# Patient Record
Sex: Male | Born: 1970 | Race: Black or African American | Hispanic: No | Marital: Married | Smoking: Former smoker
Health system: Southern US, Community
[De-identification: ages and names within clinical notes are randomized; demographics above are authoritative.]

## PROBLEM LIST (undated history)

## (undated) DIAGNOSIS — E876 Hypokalemia: Secondary | ICD-10-CM

## (undated) DIAGNOSIS — E119 Type 2 diabetes mellitus without complications: Secondary | ICD-10-CM

## (undated) DIAGNOSIS — I10 Essential (primary) hypertension: Secondary | ICD-10-CM

## (undated) DIAGNOSIS — Z8249 Family history of ischemic heart disease and other diseases of the circulatory system: Secondary | ICD-10-CM

## (undated) DIAGNOSIS — W57XXXA Bitten or stung by nonvenomous insect and other nonvenomous arthropods, initial encounter: Secondary | ICD-10-CM

## (undated) DIAGNOSIS — G5600 Carpal tunnel syndrome, unspecified upper limb: Secondary | ICD-10-CM

## (undated) DIAGNOSIS — R51 Headache: Secondary | ICD-10-CM

## (undated) DIAGNOSIS — R519 Headache, unspecified: Secondary | ICD-10-CM

## (undated) HISTORY — DX: Essential (primary) hypertension: I10

## (undated) HISTORY — DX: Carpal tunnel syndrome, unspecified upper limb: G56.00

## (undated) HISTORY — DX: Type 2 diabetes mellitus without complications: E11.9

## (undated) HISTORY — PX: ORIF FACIAL FRACTURE: SHX2118

## (undated) HISTORY — DX: Bitten or stung by nonvenomous insect and other nonvenomous arthropods, initial encounter: W57.XXXA

## (undated) HISTORY — PX: FRACTURE SURGERY: SHX138

## (undated) HISTORY — DX: Family history of ischemic heart disease and other diseases of the circulatory system: Z82.49

---

## 1997-06-30 ENCOUNTER — Emergency Department (HOSPITAL_COMMUNITY): Admission: EM | Admit: 1997-06-30 | Discharge: 1997-06-30 | Payer: Self-pay | Admitting: Emergency Medicine

## 1997-12-27 ENCOUNTER — Emergency Department (HOSPITAL_COMMUNITY): Admission: EM | Admit: 1997-12-27 | Discharge: 1997-12-27 | Payer: Self-pay | Admitting: *Deleted

## 1997-12-28 ENCOUNTER — Emergency Department (HOSPITAL_COMMUNITY): Admission: EM | Admit: 1997-12-28 | Discharge: 1997-12-28 | Payer: Self-pay | Admitting: Emergency Medicine

## 1998-01-09 ENCOUNTER — Emergency Department (HOSPITAL_COMMUNITY): Admission: EM | Admit: 1998-01-09 | Discharge: 1998-01-09 | Payer: Self-pay | Admitting: Emergency Medicine

## 1998-06-10 ENCOUNTER — Emergency Department (HOSPITAL_COMMUNITY): Admission: EM | Admit: 1998-06-10 | Discharge: 1998-06-11 | Payer: Self-pay | Admitting: Emergency Medicine

## 1998-06-11 ENCOUNTER — Encounter: Payer: Self-pay | Admitting: Emergency Medicine

## 1998-11-24 ENCOUNTER — Emergency Department (HOSPITAL_COMMUNITY): Admission: EM | Admit: 1998-11-24 | Discharge: 1998-11-24 | Payer: Self-pay | Admitting: Emergency Medicine

## 2000-07-14 ENCOUNTER — Emergency Department (HOSPITAL_COMMUNITY): Admission: EM | Admit: 2000-07-14 | Discharge: 2000-07-14 | Payer: Self-pay | Admitting: Emergency Medicine

## 2000-07-14 ENCOUNTER — Encounter: Payer: Self-pay | Admitting: Emergency Medicine

## 2000-10-27 ENCOUNTER — Inpatient Hospital Stay (HOSPITAL_COMMUNITY): Admission: EM | Admit: 2000-10-27 | Discharge: 2000-10-29 | Payer: Self-pay | Admitting: *Deleted

## 2000-12-17 ENCOUNTER — Emergency Department (HOSPITAL_COMMUNITY): Admission: EM | Admit: 2000-12-17 | Discharge: 2000-12-17 | Payer: Self-pay

## 2001-07-18 ENCOUNTER — Emergency Department (HOSPITAL_COMMUNITY): Admission: EM | Admit: 2001-07-18 | Discharge: 2001-07-18 | Payer: Self-pay | Admitting: Emergency Medicine

## 2002-03-15 HISTORY — PX: HAND SURGERY: SHX662

## 2002-07-06 ENCOUNTER — Encounter: Payer: Self-pay | Admitting: Emergency Medicine

## 2002-07-06 ENCOUNTER — Emergency Department (HOSPITAL_COMMUNITY): Admission: EM | Admit: 2002-07-06 | Discharge: 2002-07-06 | Payer: Self-pay | Admitting: Emergency Medicine

## 2002-07-23 ENCOUNTER — Emergency Department (HOSPITAL_COMMUNITY): Admission: EM | Admit: 2002-07-23 | Discharge: 2002-07-23 | Payer: Self-pay | Admitting: Emergency Medicine

## 2002-07-23 ENCOUNTER — Encounter: Payer: Self-pay | Admitting: Emergency Medicine

## 2002-08-05 ENCOUNTER — Emergency Department (HOSPITAL_COMMUNITY): Admission: EM | Admit: 2002-08-05 | Discharge: 2002-08-05 | Payer: Self-pay | Admitting: Emergency Medicine

## 2002-08-19 ENCOUNTER — Emergency Department (HOSPITAL_COMMUNITY): Admission: EM | Admit: 2002-08-19 | Discharge: 2002-08-19 | Payer: Self-pay | Admitting: Emergency Medicine

## 2002-08-27 ENCOUNTER — Emergency Department (HOSPITAL_COMMUNITY): Admission: EM | Admit: 2002-08-27 | Discharge: 2002-08-27 | Payer: Self-pay | Admitting: Emergency Medicine

## 2002-09-19 ENCOUNTER — Encounter: Payer: Self-pay | Admitting: Emergency Medicine

## 2002-09-19 ENCOUNTER — Emergency Department (HOSPITAL_COMMUNITY): Admission: EM | Admit: 2002-09-19 | Discharge: 2002-09-19 | Payer: Self-pay | Admitting: Emergency Medicine

## 2002-11-29 ENCOUNTER — Emergency Department (HOSPITAL_COMMUNITY): Admission: EM | Admit: 2002-11-29 | Discharge: 2002-11-29 | Payer: Self-pay

## 2003-03-05 ENCOUNTER — Emergency Department (HOSPITAL_COMMUNITY): Admission: EM | Admit: 2003-03-05 | Discharge: 2003-03-05 | Payer: Self-pay | Admitting: Emergency Medicine

## 2003-07-03 ENCOUNTER — Emergency Department (HOSPITAL_COMMUNITY): Admission: EM | Admit: 2003-07-03 | Discharge: 2003-07-03 | Payer: Self-pay | Admitting: Family Medicine

## 2003-11-23 ENCOUNTER — Emergency Department (HOSPITAL_COMMUNITY): Admission: EM | Admit: 2003-11-23 | Discharge: 2003-11-23 | Payer: Self-pay | Admitting: Emergency Medicine

## 2003-12-02 IMAGING — CT CT HEAD W/O CM
1 series · 16 of 28 positions shown, 20 images · non-contrast
Comparison: none

FINDINGS
CLINICAL DATA: HEADACHES.  SEIZURE.
CT OF THE HEAD WITHOUT CONTRAST
ROUTINE NON-CONTRAST HEAD CT WAS PERFORMED.
THERE IS NO EVIDENCE OF INTRACRANIAL HEMORRHAGE, BRAIN EDEMA, OR MASS EFFECT.  THE VENTRICLES ARE
NORMAL.  NO EXTRA-AXIAL ABNORMALITIES ARE IDENTIFIED.  BONE WINDOWS SHOW NO SIGNIFICANT
ABNORMALITIES.
IMPRESSION
NEGATIVE NON-CONTRAST HEAD CT.

[Series 2: trauma head · axial · 0.47mm/px · z∈[+124,+250]mm · 16 of 28 slices shown, 20 images]
[im 2/28  brain]
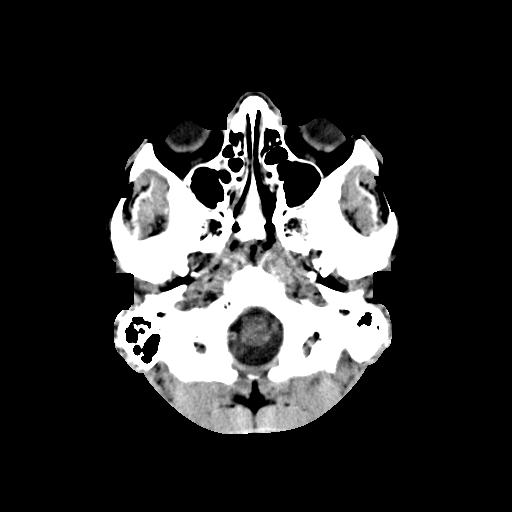
[im 2/28  bone]
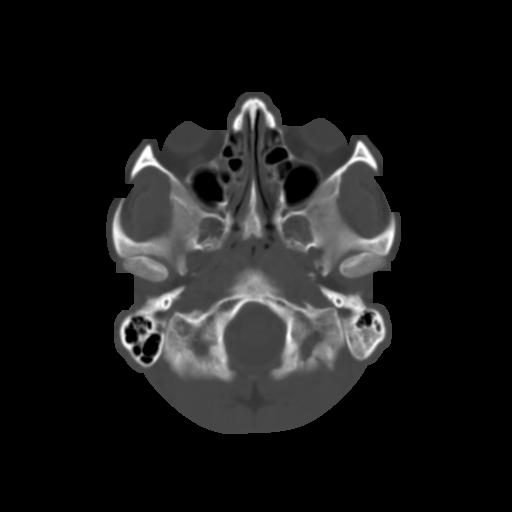
[im 4/28  brain]
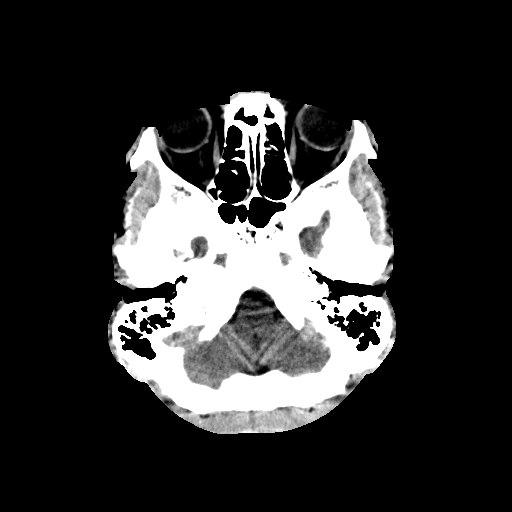
[im 6/28  brain]
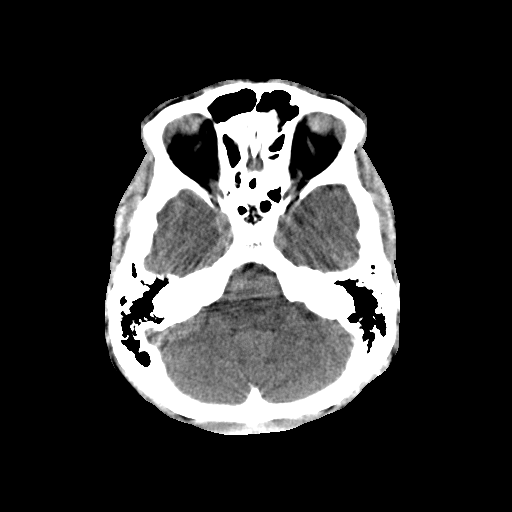
[im 7/28  brain]
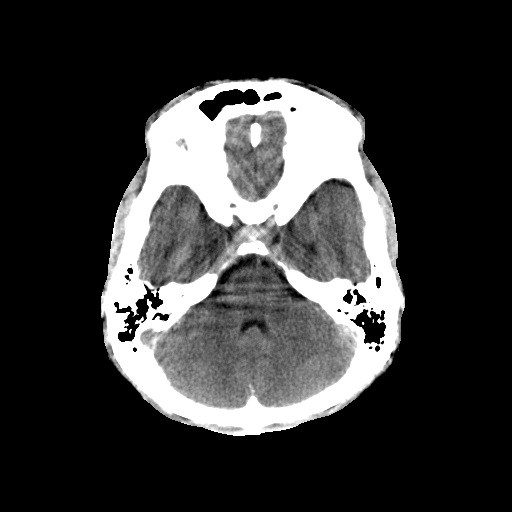
[im 9/28  brain]
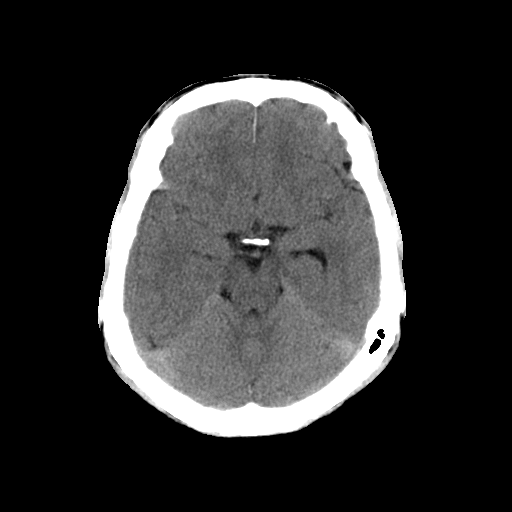
[im 9/28  bone]
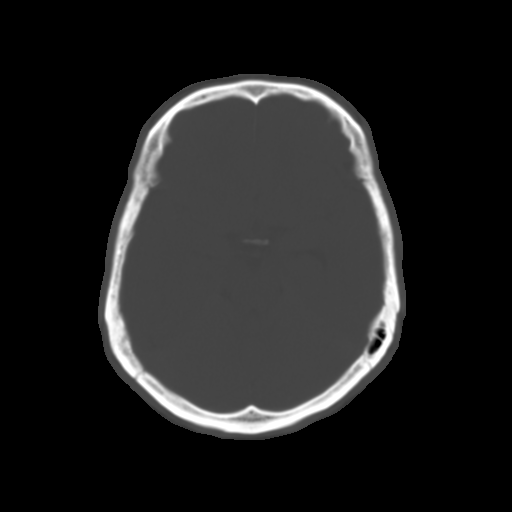
[im 10/28  brain]
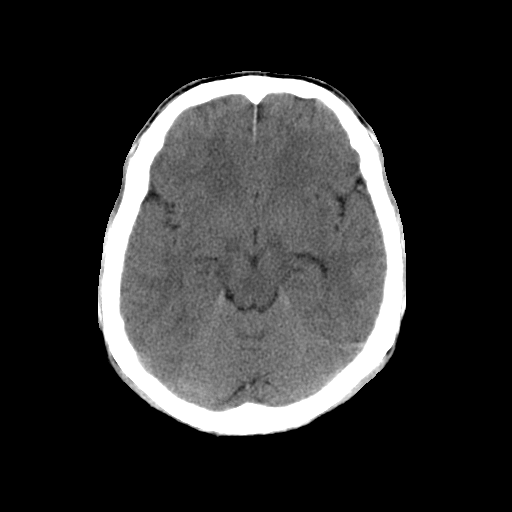
[im 12/28  brain]
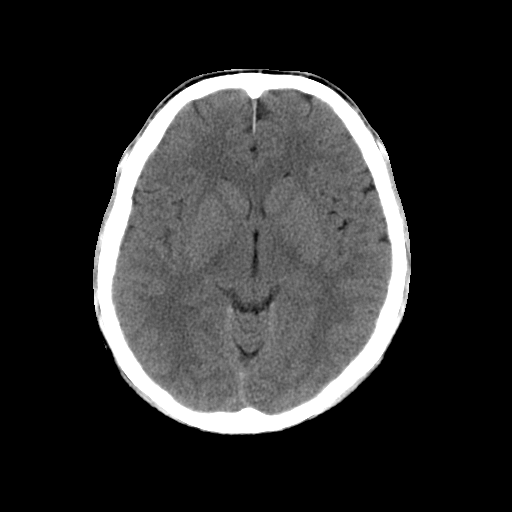
[im 14/28  brain]
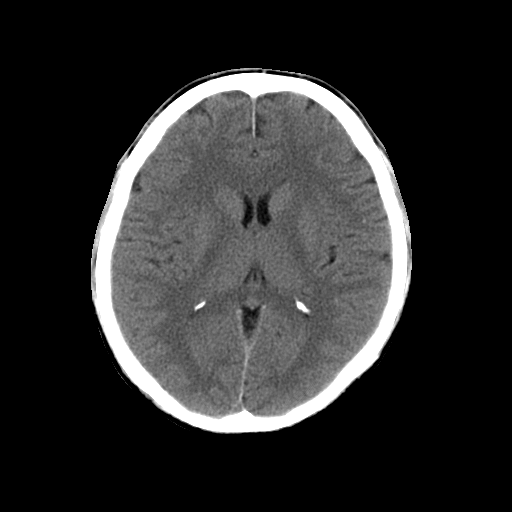
[im 15/28  brain]
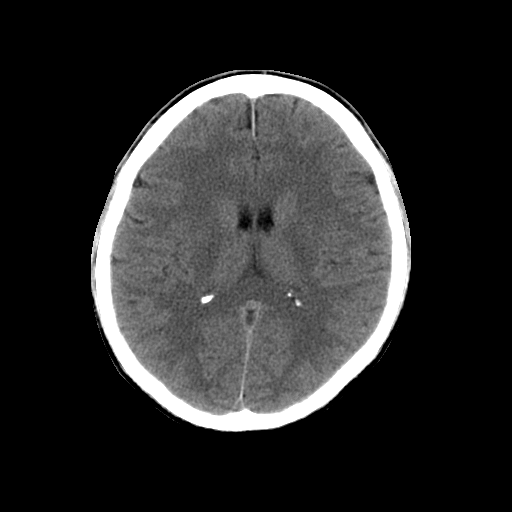
[im 15/28  bone]
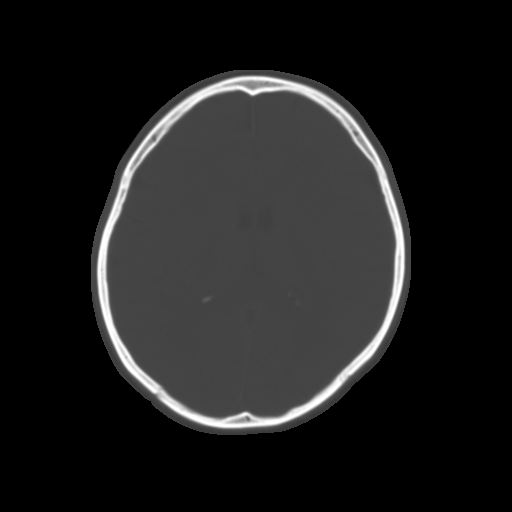
[im 17/28  brain]
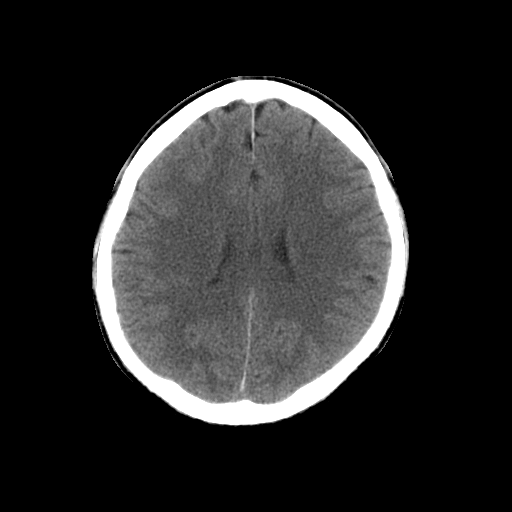
[im 19/28  brain]
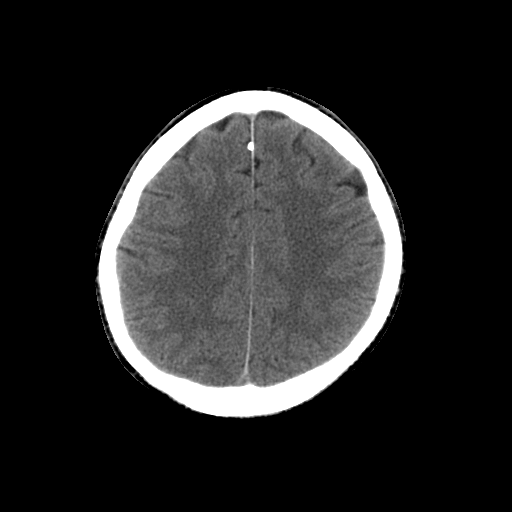
[im 20/28  brain]
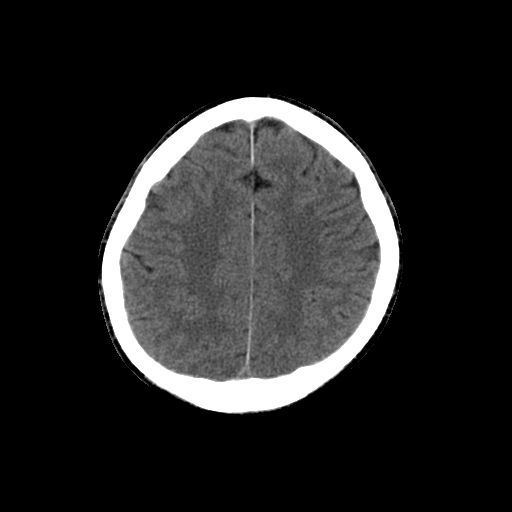
[im 22/28  brain]
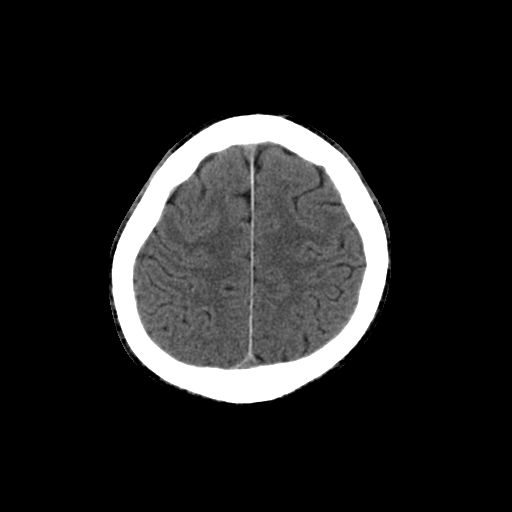
[im 22/28  bone]
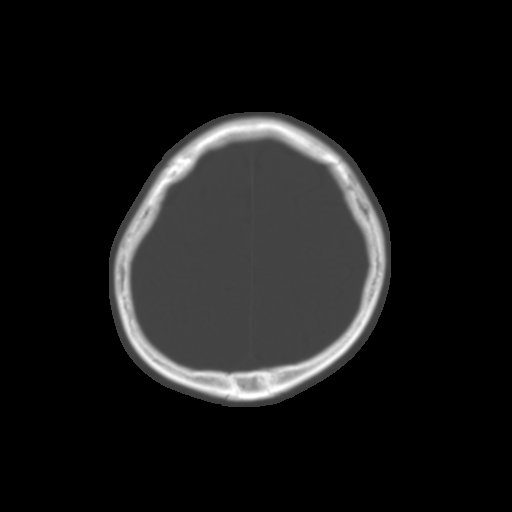
[im 23/28  brain]
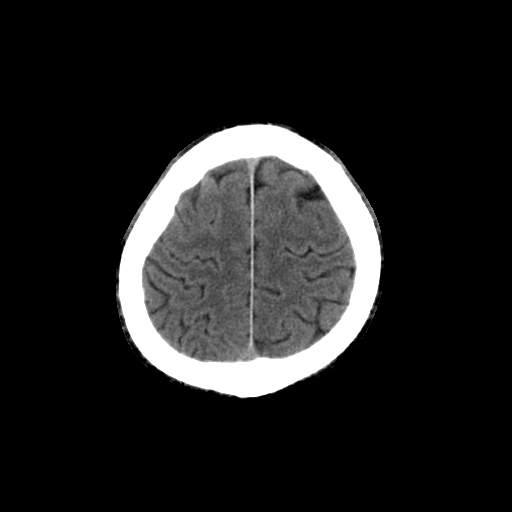
[im 25/28  brain]
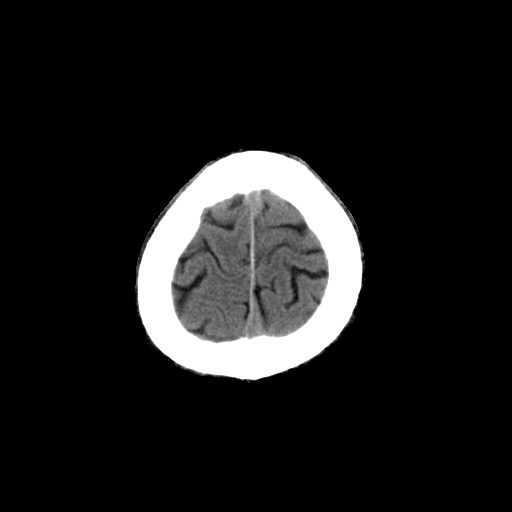
[im 27/28  brain]
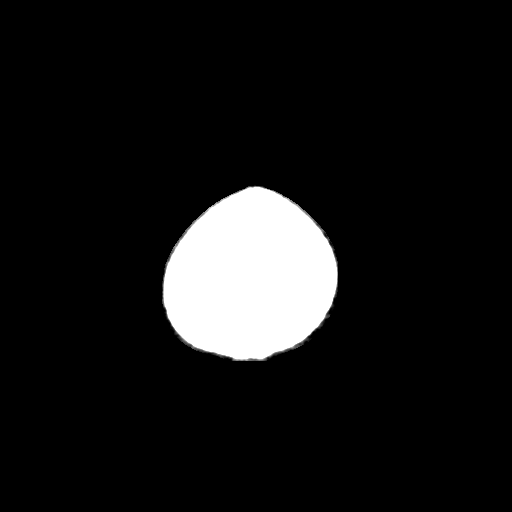

[16 of 28 positions shown; findings below may reference images not displayed]

## 2003-12-07 ENCOUNTER — Emergency Department (HOSPITAL_COMMUNITY): Admission: EM | Admit: 2003-12-07 | Discharge: 2003-12-07 | Payer: Self-pay | Admitting: Emergency Medicine

## 2004-05-04 ENCOUNTER — Emergency Department (HOSPITAL_COMMUNITY): Admission: EM | Admit: 2004-05-04 | Discharge: 2004-05-04 | Payer: Self-pay | Admitting: Family Medicine

## 2004-05-09 ENCOUNTER — Emergency Department (HOSPITAL_COMMUNITY): Admission: EM | Admit: 2004-05-09 | Discharge: 2004-05-09 | Payer: Self-pay | Admitting: Family Medicine

## 2004-07-21 ENCOUNTER — Emergency Department (HOSPITAL_COMMUNITY): Admission: EM | Admit: 2004-07-21 | Discharge: 2004-07-21 | Payer: Self-pay | Admitting: Family Medicine

## 2004-08-30 ENCOUNTER — Emergency Department (HOSPITAL_COMMUNITY): Admission: EM | Admit: 2004-08-30 | Discharge: 2004-08-30 | Payer: Self-pay | Admitting: Emergency Medicine

## 2004-09-06 ENCOUNTER — Emergency Department (HOSPITAL_COMMUNITY): Admission: EM | Admit: 2004-09-06 | Discharge: 2004-09-06 | Payer: Self-pay | Admitting: Emergency Medicine

## 2004-09-14 ENCOUNTER — Emergency Department (HOSPITAL_COMMUNITY): Admission: EM | Admit: 2004-09-14 | Discharge: 2004-09-14 | Payer: Self-pay | Admitting: Emergency Medicine

## 2005-03-29 ENCOUNTER — Emergency Department (HOSPITAL_COMMUNITY): Admission: EM | Admit: 2005-03-29 | Discharge: 2005-03-29 | Payer: Self-pay | Admitting: Family Medicine

## 2005-06-06 ENCOUNTER — Emergency Department (HOSPITAL_COMMUNITY): Admission: EM | Admit: 2005-06-06 | Discharge: 2005-06-06 | Payer: Self-pay | Admitting: Emergency Medicine

## 2005-08-05 ENCOUNTER — Emergency Department (HOSPITAL_COMMUNITY): Admission: EM | Admit: 2005-08-05 | Discharge: 2005-08-05 | Payer: Self-pay | Admitting: Emergency Medicine

## 2006-01-20 ENCOUNTER — Emergency Department (HOSPITAL_COMMUNITY): Admission: EM | Admit: 2006-01-20 | Discharge: 2006-01-20 | Payer: Self-pay | Admitting: Family Medicine

## 2006-02-16 ENCOUNTER — Emergency Department (HOSPITAL_COMMUNITY): Admission: EM | Admit: 2006-02-16 | Discharge: 2006-02-16 | Payer: Self-pay | Admitting: Emergency Medicine

## 2006-03-03 ENCOUNTER — Emergency Department (HOSPITAL_COMMUNITY): Admission: EM | Admit: 2006-03-03 | Discharge: 2006-03-03 | Payer: Self-pay | Admitting: Emergency Medicine

## 2006-07-06 ENCOUNTER — Emergency Department (HOSPITAL_COMMUNITY): Admission: EM | Admit: 2006-07-06 | Discharge: 2006-07-06 | Payer: Self-pay | Admitting: Family Medicine

## 2006-09-28 ENCOUNTER — Emergency Department (HOSPITAL_COMMUNITY): Admission: EM | Admit: 2006-09-28 | Discharge: 2006-09-28 | Payer: Self-pay | Admitting: Emergency Medicine

## 2006-10-19 ENCOUNTER — Emergency Department (HOSPITAL_COMMUNITY): Admission: EM | Admit: 2006-10-19 | Discharge: 2006-10-20 | Payer: Self-pay | Admitting: Emergency Medicine

## 2006-10-27 ENCOUNTER — Emergency Department (HOSPITAL_COMMUNITY): Admission: EM | Admit: 2006-10-27 | Discharge: 2006-10-27 | Payer: Self-pay | Admitting: Emergency Medicine

## 2006-10-30 ENCOUNTER — Emergency Department (HOSPITAL_COMMUNITY): Admission: EM | Admit: 2006-10-30 | Discharge: 2006-10-30 | Payer: Self-pay | Admitting: Emergency Medicine

## 2006-11-03 ENCOUNTER — Emergency Department (HOSPITAL_COMMUNITY): Admission: EM | Admit: 2006-11-03 | Discharge: 2006-11-03 | Payer: Self-pay | Admitting: Emergency Medicine

## 2006-11-06 ENCOUNTER — Emergency Department (HOSPITAL_COMMUNITY): Admission: EM | Admit: 2006-11-06 | Discharge: 2006-11-06 | Payer: Self-pay | Admitting: Emergency Medicine

## 2006-11-07 ENCOUNTER — Emergency Department (HOSPITAL_COMMUNITY): Admission: EM | Admit: 2006-11-07 | Discharge: 2006-11-08 | Payer: Self-pay | Admitting: Emergency Medicine

## 2006-11-07 ENCOUNTER — Emergency Department (HOSPITAL_COMMUNITY): Admission: EM | Admit: 2006-11-07 | Discharge: 2006-11-07 | Payer: Self-pay | Admitting: Family Medicine

## 2006-11-29 ENCOUNTER — Emergency Department (HOSPITAL_COMMUNITY): Admission: EM | Admit: 2006-11-29 | Discharge: 2006-11-29 | Payer: Self-pay | Admitting: Emergency Medicine

## 2006-12-10 ENCOUNTER — Emergency Department (HOSPITAL_COMMUNITY): Admission: EM | Admit: 2006-12-10 | Discharge: 2006-12-10 | Payer: Self-pay | Admitting: Emergency Medicine

## 2006-12-15 ENCOUNTER — Emergency Department (HOSPITAL_COMMUNITY): Admission: EM | Admit: 2006-12-15 | Discharge: 2006-12-15 | Payer: Self-pay | Admitting: Emergency Medicine

## 2007-01-12 ENCOUNTER — Encounter: Admission: RE | Admit: 2007-01-12 | Discharge: 2007-04-12 | Payer: Self-pay | Admitting: Orthopedic Surgery

## 2007-02-19 ENCOUNTER — Emergency Department (HOSPITAL_COMMUNITY): Admission: EM | Admit: 2007-02-19 | Discharge: 2007-02-19 | Payer: Self-pay | Admitting: Emergency Medicine

## 2007-02-24 ENCOUNTER — Emergency Department (HOSPITAL_COMMUNITY): Admission: EM | Admit: 2007-02-24 | Discharge: 2007-02-24 | Payer: Self-pay | Admitting: Emergency Medicine

## 2007-03-23 ENCOUNTER — Emergency Department (HOSPITAL_COMMUNITY): Admission: EM | Admit: 2007-03-23 | Discharge: 2007-03-23 | Payer: Self-pay | Admitting: Emergency Medicine

## 2007-04-17 ENCOUNTER — Emergency Department (HOSPITAL_COMMUNITY): Admission: EM | Admit: 2007-04-17 | Discharge: 2007-04-17 | Payer: Self-pay | Admitting: Emergency Medicine

## 2007-04-25 ENCOUNTER — Emergency Department (HOSPITAL_COMMUNITY): Admission: EM | Admit: 2007-04-25 | Discharge: 2007-04-25 | Payer: Self-pay | Admitting: Emergency Medicine

## 2007-05-29 ENCOUNTER — Emergency Department (HOSPITAL_COMMUNITY): Admission: EM | Admit: 2007-05-29 | Discharge: 2007-05-29 | Payer: Self-pay | Admitting: Family Medicine

## 2007-06-08 ENCOUNTER — Encounter: Admission: RE | Admit: 2007-06-08 | Discharge: 2007-09-06 | Payer: Self-pay | Admitting: Orthopedic Surgery

## 2007-07-01 ENCOUNTER — Emergency Department (HOSPITAL_COMMUNITY): Admission: EM | Admit: 2007-07-01 | Discharge: 2007-07-01 | Payer: Self-pay | Admitting: Emergency Medicine

## 2007-07-08 ENCOUNTER — Emergency Department (HOSPITAL_COMMUNITY): Admission: EM | Admit: 2007-07-08 | Discharge: 2007-07-08 | Payer: Self-pay | Admitting: Emergency Medicine

## 2007-07-23 ENCOUNTER — Emergency Department (HOSPITAL_COMMUNITY): Admission: EM | Admit: 2007-07-23 | Discharge: 2007-07-24 | Payer: Self-pay | Admitting: Emergency Medicine

## 2007-08-23 ENCOUNTER — Encounter (INDEPENDENT_AMBULATORY_CARE_PROVIDER_SITE_OTHER): Payer: Self-pay | Admitting: Orthopedic Surgery

## 2007-08-23 ENCOUNTER — Ambulatory Visit (HOSPITAL_BASED_OUTPATIENT_CLINIC_OR_DEPARTMENT_OTHER): Admission: RE | Admit: 2007-08-23 | Discharge: 2007-08-23 | Payer: Self-pay | Admitting: Orthopedic Surgery

## 2007-08-27 ENCOUNTER — Emergency Department (HOSPITAL_COMMUNITY): Admission: EM | Admit: 2007-08-27 | Discharge: 2007-08-27 | Payer: Self-pay | Admitting: Family Medicine

## 2007-09-04 ENCOUNTER — Emergency Department (HOSPITAL_COMMUNITY): Admission: EM | Admit: 2007-09-04 | Discharge: 2007-09-04 | Payer: Self-pay | Admitting: Emergency Medicine

## 2007-09-06 ENCOUNTER — Emergency Department (HOSPITAL_COMMUNITY): Admission: EM | Admit: 2007-09-06 | Discharge: 2007-09-06 | Payer: Self-pay | Admitting: Emergency Medicine

## 2007-09-06 ENCOUNTER — Emergency Department (HOSPITAL_COMMUNITY): Admission: EM | Admit: 2007-09-06 | Discharge: 2007-09-07 | Payer: Self-pay | Admitting: Emergency Medicine

## 2007-09-10 ENCOUNTER — Emergency Department (HOSPITAL_COMMUNITY): Admission: EM | Admit: 2007-09-10 | Discharge: 2007-09-10 | Payer: Self-pay | Admitting: Emergency Medicine

## 2007-10-05 ENCOUNTER — Encounter: Admission: RE | Admit: 2007-10-05 | Discharge: 2007-10-26 | Payer: Self-pay | Admitting: Orthopedic Surgery

## 2007-12-07 ENCOUNTER — Emergency Department (HOSPITAL_COMMUNITY): Admission: EM | Admit: 2007-12-07 | Discharge: 2007-12-07 | Payer: Self-pay | Admitting: Emergency Medicine

## 2007-12-13 ENCOUNTER — Emergency Department (HOSPITAL_COMMUNITY): Admission: EM | Admit: 2007-12-13 | Discharge: 2007-12-14 | Payer: Self-pay | Admitting: Emergency Medicine

## 2007-12-21 ENCOUNTER — Emergency Department (HOSPITAL_COMMUNITY): Admission: EM | Admit: 2007-12-21 | Discharge: 2007-12-21 | Payer: Self-pay | Admitting: Emergency Medicine

## 2007-12-29 ENCOUNTER — Emergency Department (HOSPITAL_COMMUNITY): Admission: EM | Admit: 2007-12-29 | Discharge: 2007-12-29 | Payer: Self-pay | Admitting: *Deleted

## 2008-01-05 ENCOUNTER — Emergency Department (HOSPITAL_COMMUNITY): Admission: EM | Admit: 2008-01-05 | Discharge: 2008-01-05 | Payer: Self-pay | Admitting: Emergency Medicine

## 2008-04-08 ENCOUNTER — Emergency Department (HOSPITAL_COMMUNITY): Admission: EM | Admit: 2008-04-08 | Discharge: 2008-04-08 | Payer: Self-pay | Admitting: Family Medicine

## 2008-04-20 ENCOUNTER — Emergency Department (HOSPITAL_COMMUNITY): Admission: EM | Admit: 2008-04-20 | Discharge: 2008-04-20 | Payer: Self-pay | Admitting: Emergency Medicine

## 2008-06-10 ENCOUNTER — Emergency Department (HOSPITAL_COMMUNITY): Admission: EM | Admit: 2008-06-10 | Discharge: 2008-06-10 | Payer: Self-pay | Admitting: Emergency Medicine

## 2008-10-23 ENCOUNTER — Observation Stay (HOSPITAL_COMMUNITY): Admission: EM | Admit: 2008-10-23 | Discharge: 2008-10-24 | Payer: Self-pay | Admitting: Emergency Medicine

## 2008-10-31 ENCOUNTER — Ambulatory Visit (HOSPITAL_COMMUNITY): Admission: RE | Admit: 2008-10-31 | Discharge: 2008-10-31 | Payer: Self-pay | Admitting: Otolaryngology

## 2008-11-09 ENCOUNTER — Emergency Department (HOSPITAL_COMMUNITY): Admission: EM | Admit: 2008-11-09 | Discharge: 2008-11-09 | Payer: Self-pay | Admitting: Emergency Medicine

## 2008-12-04 ENCOUNTER — Encounter: Admission: RE | Admit: 2008-12-04 | Discharge: 2008-12-04 | Payer: Self-pay | Admitting: Otolaryngology

## 2008-12-31 ENCOUNTER — Ambulatory Visit (HOSPITAL_COMMUNITY): Admission: RE | Admit: 2008-12-31 | Discharge: 2008-12-31 | Payer: Self-pay | Admitting: Otolaryngology

## 2009-02-18 ENCOUNTER — Emergency Department (HOSPITAL_COMMUNITY): Admission: EM | Admit: 2009-02-18 | Discharge: 2009-02-18 | Payer: Self-pay | Admitting: Emergency Medicine

## 2009-06-16 ENCOUNTER — Emergency Department (HOSPITAL_COMMUNITY): Admission: EM | Admit: 2009-06-16 | Discharge: 2009-06-16 | Payer: Self-pay | Admitting: Family Medicine

## 2009-06-17 ENCOUNTER — Ambulatory Visit (HOSPITAL_COMMUNITY): Admission: RE | Admit: 2009-06-17 | Discharge: 2009-06-17 | Payer: Self-pay | Admitting: Otolaryngology

## 2009-06-30 ENCOUNTER — Emergency Department (HOSPITAL_COMMUNITY): Admission: EM | Admit: 2009-06-30 | Discharge: 2009-06-30 | Payer: Self-pay | Admitting: Emergency Medicine

## 2009-11-29 ENCOUNTER — Emergency Department (HOSPITAL_COMMUNITY): Admission: EM | Admit: 2009-11-29 | Discharge: 2009-11-29 | Payer: Self-pay | Admitting: Emergency Medicine

## 2010-03-08 ENCOUNTER — Emergency Department (HOSPITAL_COMMUNITY)
Admission: EM | Admit: 2010-03-08 | Discharge: 2010-03-08 | Payer: Self-pay | Source: Home / Self Care | Admitting: Emergency Medicine

## 2010-03-13 ENCOUNTER — Ambulatory Visit (HOSPITAL_COMMUNITY)
Admission: RE | Admit: 2010-03-13 | Discharge: 2010-03-13 | Payer: Self-pay | Source: Home / Self Care | Attending: Otolaryngology | Admitting: Otolaryngology

## 2010-05-01 ENCOUNTER — Emergency Department (HOSPITAL_COMMUNITY)
Admission: EM | Admit: 2010-05-01 | Discharge: 2010-05-01 | Disposition: A | Discharge status: Home / Self Care | Payer: Medicare Other | Attending: Emergency Medicine | Admitting: Emergency Medicine

## 2010-05-01 DIAGNOSIS — K029 Dental caries, unspecified: Secondary | ICD-10-CM | POA: Insufficient documentation

## 2010-05-01 DIAGNOSIS — K089 Disorder of teeth and supporting structures, unspecified: Secondary | ICD-10-CM | POA: Insufficient documentation

## 2010-06-02 LAB — POCT RAPID STREP A (OFFICE): Streptococcus, Group A Screen (Direct): POSITIVE — AB

## 2010-06-20 LAB — CBC
HCT: 40 % (ref 39.0–52.0)
MCV: 91.5 fL (ref 78.0–100.0)
Platelets: 206 10*3/uL (ref 150–400)
WBC: 3.7 10*3/uL — ABNORMAL LOW (ref 4.0–10.5)
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
## 2010-07-28 NOTE — Op Note (Signed)
[name], [name]             ACCOUNT NO.:  [id number]   MEDICAL RECORD NO.:  [id number]          PATIENT TYPE:  AMB   LOCATION:  DSC                          FACILITY:  [facility]   PHYSICIAN:  Matthew A. Weingold, M.D.DATE OF BIRTH:  1970-04-01   DATE OF PROCEDURE:  DATE OF DISCHARGE:                               OPERATIVE REPORT   PREOPERATIVE DIAGNOSIS:  Recurrent left wrist dorsal ganglion cyst.   POSTOPERATIVE DIAGNOSES:  Recurrent left wrist dorsal ganglion cyst.   PROCEDURE:  Excision of recurrent left wrist dorsal ganglion cyst with  posterior interosseous nerve neurectomy.   SURGEON:  Artist Pais. Mina Marble, MD.   ASSISTANT:  None.   ANESTHESIA:  General.   TOURNIQUET TIME:  23 minutes.   No complications or drains.   OPERATIVE REPORT:  The patient was taken to operating suite.  After the  induction of adequate general anesthesia, the left upper extremity was  prepped and draped in usual sterile fashion.  An Esmarch was used to  exsanguinate the limb.  Tourniquet was inflated to 250 mmHg.  At this  point, longitudinal incision was made over the dorsal aspect of the left  wrist incorporating the L3-4 arthroscopic portals.  Skin was incised 3-4  cm.  Dissection was carried down to the interval between third and  fourth dorsal compartment.  A small cystic lesion was seen coming up the  distal tubercle.  This was carefully excised.  The interval between the  third and fourth dorsal compartments was identified and split.  The EPL  tendon was carefully unroofed proximally.  The fourth dorsal compartment  was retracted ulnarly and the posterior osseous nerve was identified,  transected, and sent for pathologic confirmation.  The wound was  irrigated.  Hemostasis was achieved by bipolar cautery.  It was loosely  closed with 4-0 Vicryl subcuticular stitch.  Steri-Strips, 4x4s fluffs,  and a volar splint was applied.  The patient tolerated the procedure  well and went to  recovery room in stable fashion.      Artist Pais Mina Marble, M.D.  Electronically Signed     MAW/MEDQ  D:  08/23/2007  T:  08/23/2007  Job:  [id number]

## 2010-07-31 NOTE — Discharge Summary (Signed)
[facility]  Patient:    [name], [name] Visit Number: [id number] MRN: [id number]          Service Type: PSY Location: 50 [id number] Attending Physician:  Denny Peon Dictated by:   Candi Leash. Orsini, N.P. Admit Date:  10/27/2000 Discharge Date: 10/29/2000                             Discharge Summary  IDENTIFYING INFORMATION:  This was a 40 year old African-American male who was single, admitted on involuntary commitment for psychotic symptoms.  The patient reported command hallucinations to kill himself and he reportedly took of Clorox a month prior to this admission.   The patient had been noncompliant with his medications.  The patient denied any suicidal or homicidal ideation during the interview.  The patient was followed at [facility], saw Dr. Katrinka Blazing, with a history of paranoid schizophrenia.  PAST MEDICAL HISTORY:  The patient has no primary care [facility], no current medical problems.  CURRENT MEDICATIONS: 1. Navane 2 mg b.i.d. 2. Cogentin 1 mg q.h.s.  DRUG ALLERGIES:  PENICILLIN.  PHYSICAL EXAMINATION:  GENERAL:  No significant findings.  LABORATORY DATA:  Urine drug screen was positive for marijuana.  CBC was within normal limits.  Chemistries were within normal limits.  Total bilirubin was 1.3.  T3 uptake was mildly elevated at 39.  MENTAL STATUS EXAMINATION:  He was casually dressed, alert and cooperative. Appropriate affect.  Blunt and pleasant.  Speech: Normal and relevant.  Mood: Euthymic.  Thought processes are logical and coherent, no evidence of psychosis, no delusions or evidence of suicidal or homicidal ideations. Cognitive: Intact.  ADMITTING DIAGNOSES: Axis I:    Schizophrenia, paranoid type, acute exacerbation. Axis II:   Deferred. Axis III:  None. Axis IV:   Deferred. Axis V:    Current 45, past year 43.  HOSPITAL COURSE:  The patient was admitted to stabilize mood and thinking  with a goal to improve his reality testing and alleviate suicidal ideations. Navane was increased to t.i.d. to control hallucinations with Cogentin available t.i.d.  Haldol and Cogentin were available for agitation as well as some Ativan.  We also would obtain any corroborating information.  Had a family session.  The patient had good support.  Discussions of medication compliance were undertaken.  The patient seemed to understand the importance and it was felt that the patient could be discharged to an outpatient basis. He was denying any suicidal or homicidal ideation.  Mood and affect were bright, no delusions or hallucinations.  FOLLOWUP: 1. The patient was to follow up with [facility] on    Thursday, August 22, with the re-entry group. 2. The patient was also to follow up with chemistries for the slight elevation    of his total bilirubin 1.3.  DISCHARGE INSTRUCTIONS:  He was to call mental health if there was any problem with his medications or if symptoms returned.  DISCHARGE MEDICATIONS: 1. Navane 2 mg one t.i.d. with instructions to take one additional tablet if     voices return. 2. Cogentin 0.5 mg one t.i.d. for side effects.  ACTIVITY AND DIET:  No restrictions for activity or diet.  DISCHARGE DIAGNOSES: Axis I:    Schizophrenia, paranoid type. Axis II:   Deferred. Axis III:  None. Axis IV:   Deferred. Axis V:    Current 60, past year 21. Dictated by:   Liborio Nixon  M. Orsini, N.P. Attending Physician:  Denny Peon DD:  11/28/00 TD:  11/28/00 Job: [id number]
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
## 2010-08-07 ENCOUNTER — Emergency Department (HOSPITAL_COMMUNITY)
Admission: EM | Admit: 2010-08-07 | Discharge: 2010-08-07 | Disposition: A | Discharge status: Home / Self Care | Payer: Medicare Other | Attending: Emergency Medicine | Admitting: Emergency Medicine

## 2010-08-07 DIAGNOSIS — K089 Disorder of teeth and supporting structures, unspecified: Secondary | ICD-10-CM | POA: Insufficient documentation

## 2010-08-07 DIAGNOSIS — K029 Dental caries, unspecified: Secondary | ICD-10-CM | POA: Insufficient documentation

## 2010-09-10 ENCOUNTER — Emergency Department (HOSPITAL_COMMUNITY)
Admission: EM | Admit: 2010-09-10 | Discharge: 2010-09-10 | Disposition: A | Discharge status: Home / Self Care | Payer: Medicare Other | Attending: Emergency Medicine | Admitting: Emergency Medicine

## 2010-09-10 DIAGNOSIS — K029 Dental caries, unspecified: Secondary | ICD-10-CM | POA: Insufficient documentation

## 2010-09-10 DIAGNOSIS — K089 Disorder of teeth and supporting structures, unspecified: Secondary | ICD-10-CM | POA: Insufficient documentation

## 2010-09-19 ENCOUNTER — Emergency Department (HOSPITAL_COMMUNITY)
Admission: EM | Admit: 2010-09-19 | Discharge: 2010-09-19 | Disposition: A | Discharge status: Home / Self Care | Payer: Medicare Other | Attending: Emergency Medicine | Admitting: Emergency Medicine

## 2010-09-19 DIAGNOSIS — K089 Disorder of teeth and supporting structures, unspecified: Secondary | ICD-10-CM | POA: Insufficient documentation

## 2010-12-08 ENCOUNTER — Emergency Department (HOSPITAL_COMMUNITY)
Admission: EM | Admit: 2010-12-08 | Discharge: 2010-12-08 | Discharge status: Against Medical Advice | Payer: Medicare Other | Attending: Emergency Medicine | Admitting: Emergency Medicine

## 2010-12-08 ENCOUNTER — Emergency Department (HOSPITAL_COMMUNITY): Payer: Medicare Other

## 2010-12-08 DIAGNOSIS — R209 Unspecified disturbances of skin sensation: Secondary | ICD-10-CM | POA: Insufficient documentation

## 2010-12-10 LAB — STREP A DNA PROBE: Group A Strep Probe: NEGATIVE

## 2010-12-10 LAB — POCT HEMOGLOBIN-HEMACUE: Hemoglobin: 14.3

## 2010-12-10 LAB — RAPID STREP SCREEN (MED CTR MEBANE ONLY): Streptococcus, Group A Screen (Direct): NEGATIVE

## 2010-12-14 LAB — POCT I-STAT, CHEM 8
BUN: 14
Calcium, Ion: 1.27
Creatinine, Ser: 1.1
HCT: 43
Hemoglobin: 14.6
Potassium: 3.5
TCO2: 28

## 2010-12-14 LAB — CBC
Hemoglobin: 13.6
Platelets: 189
RBC: 4.53
RDW: 14.9
WBC: 5.7

## 2010-12-14 LAB — RAPID URINE DRUG SCREEN, HOSP PERFORMED
Barbiturates: NOT DETECTED
Benzodiazepines: NOT DETECTED
Opiates: POSITIVE — AB

## 2010-12-14 LAB — POCT CARDIAC MARKERS: Myoglobin, poc: 49.6

## 2010-12-14 LAB — DIFFERENTIAL
Lymphocytes Relative: 49 — ABNORMAL HIGH
Lymphs Abs: 2.8
Monocytes Absolute: 0.4
Neutrophils Relative %: 40 — ABNORMAL LOW

## 2010-12-25 LAB — I-STAT 8, (EC8 V) (CONVERTED LAB)
Acid-Base Excess: 12 — ABNORMAL HIGH
BUN: 12
Bicarbonate: 34.8 — ABNORMAL HIGH
Glucose, Bld: 137 — ABNORMAL HIGH
HCT: 52
pCO2, Ven: 38.6 — ABNORMAL LOW
pH, Ven: 7.563 — ABNORMAL HIGH

## 2010-12-25 LAB — POCT I-STAT CREATININE: Operator id: 192351

## 2011-01-18 ENCOUNTER — Other Ambulatory Visit: Payer: Self-pay

## 2011-01-18 ENCOUNTER — Emergency Department (HOSPITAL_COMMUNITY): Payer: Medicare Other

## 2011-01-18 ENCOUNTER — Emergency Department (HOSPITAL_COMMUNITY)
Admission: EM | Admit: 2011-01-18 | Discharge: 2011-01-18 | Disposition: A | Discharge status: Home / Self Care | Payer: Medicare Other | Attending: Emergency Medicine | Admitting: Emergency Medicine

## 2011-01-18 ENCOUNTER — Encounter (HOSPITAL_COMMUNITY): Payer: Self-pay | Admitting: *Deleted

## 2011-01-18 DIAGNOSIS — M542 Cervicalgia: Secondary | ICD-10-CM | POA: Insufficient documentation

## 2011-01-18 DIAGNOSIS — R51 Headache: Secondary | ICD-10-CM | POA: Insufficient documentation

## 2011-01-18 DIAGNOSIS — M546 Pain in thoracic spine: Secondary | ICD-10-CM | POA: Insufficient documentation

## 2011-01-18 DIAGNOSIS — M79609 Pain in unspecified limb: Secondary | ICD-10-CM | POA: Insufficient documentation

## 2011-01-18 DIAGNOSIS — R0789 Other chest pain: Secondary | ICD-10-CM

## 2011-01-18 MED ORDER — OXYCODONE-ACETAMINOPHEN 5-325 MG PO TABS: 1.0000 | ORAL_TABLET | Freq: Four times a day (QID) | ORAL | Status: AC | PRN

## 2011-01-18 MED ORDER — IBUPROFEN 600 MG PO TABS: 600.0000 mg | ORAL_TABLET | Freq: Four times a day (QID) | ORAL | Status: DC | PRN

## 2011-01-18 MED ORDER — IBUPROFEN 600 MG PO TABS: 600.0000 mg | ORAL_TABLET | Freq: Four times a day (QID) | ORAL | Status: AC | PRN

## 2011-01-18 MED ORDER — OXYCODONE-ACETAMINOPHEN 5-325 MG PO TABS: 1.0000 | ORAL_TABLET | Freq: Four times a day (QID) | ORAL | Status: DC | PRN

## 2011-01-18 MED ORDER — MORPHINE SULFATE 4 MG/ML IJ SOLN
4.0000 mg | Freq: Once | INTRAMUSCULAR | Status: AC
  Administered 2011-01-18: 4 mg via INTRAVENOUS
  Filled 2011-01-18: qty 1

## 2011-01-18 MED ORDER — ONDANSETRON HCL 4 MG/2ML IJ SOLN
4.0000 mg | Freq: Once | INTRAMUSCULAR | Status: AC
  Administered 2011-01-18: 4 mg via INTRAVENOUS
  Filled 2011-01-18: qty 2

## 2011-01-18 NOTE — ED Notes (Signed)
Pt continues to complain about blurred vision and headache. md at the bedside and is aware of situation. Pt resting waiting on re-eval from edp

## 2011-01-18 NOTE — ED Notes (Signed)
Pt reports while moving a deep freezer he began to have sharp chest pain and numbness of left side of body. Pt states pain in chest feels like burning. Denies shortness of breath/nausea/vomiting. Pt also states woke up with headache, took advil with no relief. Pt rates chest pain 9/10. States worse with palpation. States numbness in left side is intermittant.

## 2011-01-18 NOTE — ED Notes (Signed)
Pt reports woke up this am with headache. States took advil, without relief. Pt reports was moving a deep freezer and had burning sensation in chest during move. Pt reports after moving freezer began to have left sided numbness intermittantly. Pt c/o pain "shooting down spine" from his headache. Pt alert and oriented. No neuro deficits noted.

## 2011-01-18 NOTE — ED Provider Notes (Signed)
History     CSN: [id number] Arrival date & time: 01/18/2011  2:19 PM   First MD Initiated Contact with Patient 01/18/11 1510      No chief complaint on file.   (Consider location/radiation/quality/duration/timing/severity/associated sxs/prior treatment) HPI Patient presents with complaint of diffuse chest wall pain. He states the pain began after lifting furniture or. The pain became worse today. He states the entire chest primarily over the left side is tender to types and worse pain with movement. He also has some pain in his left upper arm. Also complains of pain in his neck and upper back that seems to radiate to the left side. He did not have any trauma or fall. He has no difficulty breathing. He's had no dizziness or fainting. He also complains of daily frontal headaches for which she has tried ibuprofen. However the headache is not his main complaint. Ibuprofen does not make this pain better. Movement and palpation meter worse. There no other associated systemic symptoms.  History reviewed. No pertinent past medical history.  Past Surgical History  Procedure Date  . Orif facial fracture     No family history on file.  History  Substance Use Topics  . Smoking status: Current Everyday Smoker    Types: Cigarettes  . Smokeless tobacco: Not on file  . Alcohol Use: No      Review of Systems ROS reviewed and otherwise negative except for mentioned in HPI Allergies  Penicillins  Home Medications   Current Outpatient Rx  Name Route Sig Dispense Refill  . HYDROCODONE-ACETAMINOPHEN 5-325 MG PO TABS Oral Take 1 tablet by mouth every 6 (six) hours as needed. For pain      . IBUPROFEN 200 MG PO TABS Oral Take 200 mg by mouth every 6 (six) hours as needed. For pain       BP 142/92  Pulse 62  Temp(Src) 98 F (36.7 C) (Oral)  Resp 14  Ht 5\' 4"  (1.626 m)  Wt 138 lb (62.596 kg)  BMI 23.69 kg/m2  SpO2 100% Vitals reviewed Physical Exam Physical Examination: General  appearance - alert, well appearing, and in no distress Mental status - alert, oriented to person, place, and time Eyes - pupils equal and reactive, extraocular eye movements intact Neck - supple, no significant adenopathy, no midline cervical spine tenderness Lymphatics - no palpable lymphadenopathy Chest - clear to auscultation, no wheezes, rales or rhonchi, symmetric air entry, diffuse chest wall tenderness over left anterior and lateral chest wall, no crepitus, no stepoffs, symmetric chest wall movement Heart - normal rate, regular rhythm, normal S1, S2, no murmurs, rubs, clicks or gallops Abdomen - soft, nontender, nondistended, no masses or organomegaly Back exam - full range of motion, no tenderness, palpable spasm or pain on motion, no midline tenderness to palpation Neurological - alert, oriented, normal speech, no focal findings or movement disorder noted, motor and sensory grossly normal bilaterally Musculoskeletal - no joint tenderness, deformity or swelling Extremities - peripheral pulses normal, no pedal edema, no clubbing or cyanosis Skin - normal coloration and turgor, no rashes, no suspicious skin lesions noted    ED Course  Procedures (including critical care time)  Date: 01/18/2011  Rate: 59  Rhythm: normal sinus rhythm  QRS Axis: normal  Intervals: normal  ST/T Wave abnormalities: normal  Conduction Disutrbances:none  Narrative Interpretation:   Old EKG Reviewed: no significant changes since 12/29/07    Labs Reviewed - No data to display Dg Chest 2 View  01/18/2011  *RADIOLOGY REPORT*  Clinical Data: Chest pain  CHEST - 2 VIEW  Comparison: Chest radiograph 12/29/2007  Findings: Normal mediastinum and cardiac silhouette.  Normal pulmonary  vasculature.  No evidence of effusion, infiltrate, or pneumothorax.  No acute bony abnormality.  IMPRESSION: Normal chest radiograph.  Original Report Authenticated By: Genevive Bi, M.D.     No diagnosis found.    MDM    Pt with chest wall pain after moving furniture today. Pain is reproducible to palpation and worse with movment, EKG and CXR without acute findings.  Pt treated for pain.  Discharged with strict return precautions.  For likely musculoskeletal pain.  Pt agreeable with plan.         Ethelda Chick, MD 01/18/11 [phone]

## 2011-01-18 NOTE — ED Notes (Signed)
Transported to x-ray for chest film

## 2011-04-14 ENCOUNTER — Emergency Department (INDEPENDENT_AMBULATORY_CARE_PROVIDER_SITE_OTHER)
Admission: EM | Admit: 2011-04-14 | Discharge: 2011-04-14 | Disposition: A | Discharge status: Home / Self Care | Payer: Medicare Other | Source: Home / Self Care | Attending: Family Medicine | Admitting: Family Medicine

## 2011-04-14 ENCOUNTER — Encounter (HOSPITAL_COMMUNITY): Payer: Self-pay | Admitting: *Deleted

## 2011-04-14 DIAGNOSIS — R6884 Jaw pain: Secondary | ICD-10-CM | POA: Diagnosis not present

## 2011-04-14 MED ORDER — KETOROLAC TROMETHAMINE 30 MG/ML IJ SOLN
INTRAMUSCULAR | Status: AC
  Filled 2011-04-14: qty 1

## 2011-04-14 MED ORDER — KETOROLAC TROMETHAMINE 30 MG/ML IJ SOLN
30.0000 mg | Freq: Once | INTRAMUSCULAR | Status: AC
  Administered 2011-04-14: 30 mg via INTRAMUSCULAR

## 2011-04-14 MED ORDER — DICLOFENAC POTASSIUM 50 MG PO TABS: 50.0000 mg | ORAL_TABLET | Freq: Three times a day (TID) | ORAL | Status: DC
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
## 2011-04-14 NOTE — ED Notes (Signed)
Pt  Has  Pain  r  Side  Of  Jaw/  Face       Pt  Out  Of  His  Pain  meds     -  He  States  He  Has  Surgery  Scheduled  For  Feb  3    With  Dr  Jenne Pane           For  His   Jaw

## 2011-04-15 DIAGNOSIS — R6884 Jaw pain: Secondary | ICD-10-CM | POA: Diagnosis not present

## 2011-04-19 DIAGNOSIS — D509 Iron deficiency anemia, unspecified: Secondary | ICD-10-CM | POA: Diagnosis not present

## 2011-04-19 DIAGNOSIS — R6884 Jaw pain: Secondary | ICD-10-CM | POA: Diagnosis not present

## 2011-04-19 DIAGNOSIS — R030 Elevated blood-pressure reading, without diagnosis of hypertension: Secondary | ICD-10-CM | POA: Diagnosis not present

## 2011-05-19 DIAGNOSIS — R6884 Jaw pain: Secondary | ICD-10-CM | POA: Diagnosis not present

## 2011-05-19 DIAGNOSIS — D509 Iron deficiency anemia, unspecified: Secondary | ICD-10-CM | POA: Diagnosis not present

## 2011-05-19 DIAGNOSIS — T300 Burn of unspecified body region, unspecified degree: Secondary | ICD-10-CM | POA: Diagnosis not present

## 2011-08-23 DIAGNOSIS — Z79899 Other long term (current) drug therapy: Secondary | ICD-10-CM | POA: Diagnosis not present

## 2011-09-04 ENCOUNTER — Encounter (HOSPITAL_COMMUNITY): Payer: Self-pay | Admitting: Emergency Medicine

## 2011-09-04 ENCOUNTER — Emergency Department (HOSPITAL_COMMUNITY)
Admission: EM | Admit: 2011-09-04 | Discharge: 2011-09-05 | Disposition: A | Discharge status: Home / Self Care | Payer: Medicare Other | Attending: Emergency Medicine | Admitting: Emergency Medicine

## 2011-09-04 ENCOUNTER — Emergency Department (HOSPITAL_COMMUNITY): Payer: Medicare Other

## 2011-09-04 DIAGNOSIS — F17200 Nicotine dependence, unspecified, uncomplicated: Secondary | ICD-10-CM | POA: Insufficient documentation

## 2011-09-04 DIAGNOSIS — R1031 Right lower quadrant pain: Secondary | ICD-10-CM | POA: Diagnosis not present

## 2011-09-04 DIAGNOSIS — M79609 Pain in unspecified limb: Secondary | ICD-10-CM | POA: Diagnosis not present

## 2011-09-04 DIAGNOSIS — R109 Unspecified abdominal pain: Secondary | ICD-10-CM | POA: Diagnosis not present

## 2011-09-04 DIAGNOSIS — R599 Enlarged lymph nodes, unspecified: Secondary | ICD-10-CM | POA: Diagnosis not present

## 2011-09-04 LAB — CBC
Hemoglobin: 14.4 g/dL (ref 13.0–17.0)
MCH: 30.3 pg (ref 26.0–34.0)
MCV: 87.8 fL (ref 78.0–100.0)
RBC: 4.75 MIL/uL (ref 4.22–5.81)

## 2011-09-04 LAB — POCT I-STAT, CHEM 8
BUN: 12 mg/dL (ref 6–23)
Creatinine, Ser: 1.1 mg/dL (ref 0.50–1.35)
Glucose, Bld: 90 mg/dL (ref 70–99)
Hemoglobin: 15.6 g/dL (ref 13.0–17.0)
Potassium: 3.6 mEq/L (ref 3.5–5.1)

## 2011-09-04 LAB — DIFFERENTIAL
Eosinophils Absolute: 0 10*3/uL (ref 0.0–0.7)
Lymphs Abs: 0.7 10*3/uL (ref 0.7–4.0)
Monocytes Relative: 11 % (ref 3–12)
Neutrophils Relative %: 83 % — ABNORMAL HIGH (ref 43–77)

## 2011-09-04 MED ORDER — IOHEXOL 300 MG/ML  SOLN
100.0000 mL | Freq: Once | INTRAMUSCULAR | Status: AC | PRN
  Administered 2011-09-04: 100 mL via INTRAVENOUS

## 2011-09-04 MED ORDER — IOHEXOL 300 MG/ML  SOLN
20.0000 mL | INTRAMUSCULAR | Status: AC
  Administered 2011-09-04: 20 mL via ORAL

## 2011-09-04 MED ORDER — SODIUM CHLORIDE 0.9 % IV SOLN: INTRAVENOUS | Status: DC

## 2011-09-04 MED ORDER — SODIUM CHLORIDE 0.9 % IV BOLUS (SEPSIS)
1000.0000 mL | Freq: Once | INTRAVENOUS | Status: AC
  Administered 2011-09-04: 1000 mL via INTRAVENOUS

## 2011-09-04 MED ORDER — FENTANYL CITRATE 0.05 MG/ML IJ SOLN
100.0000 ug | Freq: Once | INTRAMUSCULAR | Status: AC
  Administered 2011-09-04: 100 ug via INTRAVENOUS
  Filled 2011-09-04: qty 2

## 2011-09-04 NOTE — ED Notes (Signed)
Pt returned from CT °

## 2011-09-04 NOTE — ED Notes (Signed)
PT. REPORTS RIGHT GROIN PAIN " LUMP" ONSET THIS EVENING WHILE PULLING A HEAVY TRANSMISSION THIS EVENING .

## 2011-09-04 NOTE — ED Notes (Signed)
Patient transported to CT 

## 2011-09-04 NOTE — ED Provider Notes (Addendum)
History     CSN: [id number]  Arrival date & time 09/04/11  1916   First MD Initiated Contact with Patient 09/04/11 2125      Chief Complaint  Patient presents with  . 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 This painful lump is worse with palpation movement and coughing. He does not have any abdominal pain or nausea vomiting. He is no testicular pain. He has no scrotal pain or swelling. He has no chest pain or shortness of breath. He is no weakness or numbness. There is no treatment prior to arrival. His pain is moderately severe. His pain is constant for the last couple hours since this began. His pain is localized without radiation or associated symptoms. History reviewed. No pertinent past medical history.  Past Surgical History  Procedure Date  . Orif facial fracture     No family history on file.  History  Substance Use Topics  . Smoking status: Current Everyday Smoker    Types: Cigarettes  . Smokeless tobacco: Not on file  . Alcohol Use: No      Review of Systems  Constitutional: Negative for fever.       10 Systems reviewed and are negative for acute change except as noted in the HPI.  HENT: Negative for congestion.   Eyes: Negative for discharge and redness.  Respiratory: Negative for cough and shortness of breath.   Cardiovascular: Negative for chest pain.  Gastrointestinal: Negative for nausea, vomiting and abdominal pain.  Genitourinary: Negative for dysuria, discharge, scrotal swelling and testicular pain.  Musculoskeletal: Negative for back pain.  Skin: Negative for rash.  Neurological: Negative for syncope, numbness and headaches.  Psychiatric/Behavioral:       No  behavior change.    Allergies  Review of patient's allergies indicates no active allergies.  Home Medications   Current Outpatient Rx  Name Route Sig Dispense Refill  . OXYCODONE-ACETAMINOPHEN 5-325 MG PO TABS Oral Take 2 tablets by mouth every 4 (four) hours as needed for pain. 10 tablet 0    BP 130/74  Pulse 70  Temp 99.2 F (37.3 C) (Oral)  Resp 20  SpO2 100%  Physical Exam  Nursing note and vitals reviewed. Constitutional:       Awake, alert, nontoxic appearance.  HENT:  Head: Atraumatic.  Eyes: Right eye exhibits no discharge. Left eye exhibits no discharge.  Neck: Neck supple.  Cardiovascular: Normal rate and regular rhythm.   No murmur heard. Pulmonary/Chest: Effort normal and breath sounds normal. No respiratory distress. He has no wheezes. He has no rales. He exhibits no tenderness.  Abdominal: Soft. Bowel sounds are normal. He exhibits no mass. There is no tenderness. There is no rebound and no guarding.  Genitourinary:       His testicles are descended and nontender, he does not have any palpable inguinal hernias, his inguinal creases are nontender, his right proximal anterior thigh distal to his inguinal canal has a several centimeters diameter painful tender soft mass which is nonpulsatile  Musculoskeletal: He exhibits no tenderness.       Baseline ROM, no obvious new focal weakness.  Neurological:       Mental status and motor strength appears baseline for patient and situation.  Skin: No rash noted.  Psychiatric: He has a normal mood and affect.    ED Course  Procedures (including critical care time) Repeat examination after CT scan reveals unchanged right proximal anterior thigh tender mass still nonpulsatile with distal neurovascular status intact to the right leg with intact dorsalis pedis and posterior tibial pulses as well as capillary refill less than 2 seconds in his toes normal light touch and good movement to his right foot. There is no evidence of  expanding hematoma or color change to his right leg or foot.0015 Labs Reviewed  CBC - Abnormal; Notable for the following:    WBC 11.4 (*)     All other components within normal limits  DIFFERENTIAL - Abnormal; Notable for the following:    Neutrophils Relative 83 (*)     Neutro Abs 9.4 (*)     Lymphocytes Relative 6 (*)     Monocytes Absolute 1.2 (*)     All other components within normal limits  POCT I-STAT, CHEM 8  LAB REPORT - SCANNED   No results found.   1. Right inguinal pain       MDM  Pt stable in ED with no significant deterioration in condition.Patient / Family / Caregiver informed of clinical course, understand medical decision-making process, and agree with plan.        Hurman Horn, MD 09/09/11 1610  Addendum [id number]: At time of ED visit Pt with limited ED bedside US showing apparent vascular structure at Pt's site of pain/bulge, but not clearly only arterial flow; unclear if arterial vs venous abnormality thus ordered both arterial and venous US to r/o DVT vs aneurysm.  Hurman Horn, MD 09/10/11 [place]

## 2011-09-04 NOTE — ED Notes (Signed)
Pt unable to void at this time. 

## 2011-09-05 ENCOUNTER — Ambulatory Visit (HOSPITAL_COMMUNITY)
Admission: RE | Admit: 2011-09-05 | Discharge: 2011-09-05 | Disposition: A | Discharge status: Home / Self Care | Payer: Medicare Other | Source: Ambulatory Visit | Attending: Emergency Medicine | Admitting: Emergency Medicine

## 2011-09-05 DIAGNOSIS — R229 Localized swelling, mass and lump, unspecified: Secondary | ICD-10-CM | POA: Diagnosis not present

## 2011-09-05 DIAGNOSIS — R52 Pain, unspecified: Secondary | ICD-10-CM

## 2011-09-05 MED ORDER — OXYCODONE-ACETAMINOPHEN 5-325 MG PO TABS: 2.0000 | ORAL_TABLET | ORAL | Status: AC | PRN

## 2011-09-05 MED ORDER — OXYCODONE-ACETAMINOPHEN 5-325 MG PO TABS: 2.0000 | ORAL_TABLET | ORAL | Status: DC | PRN

## 2011-09-05 MED ORDER — OXYCODONE-ACETAMINOPHEN 5-325 MG PO TABS
2.0000 | ORAL_TABLET | Freq: Once | ORAL | Status: AC
  Administered 2011-09-05: 2 via ORAL
  Filled 2011-09-05: qty 2

## 2011-09-05 NOTE — Discharge Instructions (Signed)
Return sooner to the emergency department tonight if you develop weakness or numbness to the right leg, color change to the right leg or foot, severe pain to the rest of the right leg, or other concerns.

## 2011-09-05 NOTE — Progress Notes (Signed)
Right lower extremity venous duplex completed.  Preliminary report is negative for DVT, SVT, or a Baker's cyst.  No evidence of pseudoaneurysm.

## 2011-09-06 ENCOUNTER — Ambulatory Visit (HOSPITAL_COMMUNITY): Payer: Medicare Other

## 2011-09-07 DIAGNOSIS — I889 Nonspecific lymphadenitis, unspecified: Secondary | ICD-10-CM | POA: Diagnosis not present

## 2011-09-07 DIAGNOSIS — L03039 Cellulitis of unspecified toe: Secondary | ICD-10-CM | POA: Diagnosis not present

## 2011-10-04 DIAGNOSIS — Z79899 Other long term (current) drug therapy: Secondary | ICD-10-CM | POA: Diagnosis not present

## 2011-12-30 ENCOUNTER — Emergency Department (HOSPITAL_COMMUNITY)
Admission: EM | Admit: 2011-12-30 | Discharge: 2011-12-30 | Disposition: A | Discharge status: Home / Self Care | Payer: Medicare Other | Attending: Emergency Medicine | Admitting: Emergency Medicine

## 2011-12-30 ENCOUNTER — Encounter (HOSPITAL_COMMUNITY): Payer: Self-pay | Admitting: Emergency Medicine

## 2011-12-30 DIAGNOSIS — R6884 Jaw pain: Secondary | ICD-10-CM | POA: Diagnosis not present

## 2011-12-30 DIAGNOSIS — R209 Unspecified disturbances of skin sensation: Secondary | ICD-10-CM | POA: Insufficient documentation

## 2011-12-30 DIAGNOSIS — F17200 Nicotine dependence, unspecified, uncomplicated: Secondary | ICD-10-CM | POA: Diagnosis not present

## 2011-12-30 DIAGNOSIS — R51 Headache: Secondary | ICD-10-CM | POA: Diagnosis not present

## 2011-12-30 MED ORDER — HYDROCODONE-ACETAMINOPHEN 5-325 MG PO TABS: 2.0000 | ORAL_TABLET | ORAL | Status: DC | PRN
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
## 2011-12-30 NOTE — ED Notes (Signed)
Pt c/o of pain from his chin.  Pt states he had reconstructive surgery on his chin and at this time his chin and lower lip is numb.  Lip, tongue and mucous membranes appear pink and moist.  Pt appears stable with vital signs in normal limits.

## 2011-12-30 NOTE — ED Notes (Signed)
Per pt, has had multiple surgeries on chin.  Pt states on/off since initial injury.  Exact dates unknown.  Pt states pain increased in past 2 weeks with pain and numbness.

## 2011-12-31 NOTE — ED Provider Notes (Signed)
Medical screening examination/treatment/procedure(s) were performed by non-physician practitioner and as supervising physician I was immediately available for consultation/collaboration.   Decarlo Rivet R Harshita Bernales, MD 12/31/11 0446 

## 2012-03-13 ENCOUNTER — Encounter (HOSPITAL_COMMUNITY): Payer: Self-pay | Admitting: *Deleted

## 2012-03-13 ENCOUNTER — Emergency Department (HOSPITAL_COMMUNITY)
Admission: EM | Admit: 2012-03-13 | Discharge: 2012-03-13 | Discharge status: Against Medical Advice | Payer: Medicare Other | Attending: Emergency Medicine | Admitting: Emergency Medicine

## 2012-03-13 DIAGNOSIS — Z5320 Procedure and treatment not carried out because of patient's decision for unspecified reasons: Secondary | ICD-10-CM | POA: Diagnosis not present

## 2012-03-13 NOTE — ED Notes (Signed)
Pt not in waiting room

## 2012-03-13 NOTE — ED Notes (Signed)
Pt not in waiting room x 2 

## 2012-03-13 NOTE — ED Notes (Signed)
Pt c/o pain up his "spinal cord"; and possible infected hair follicle in groin area

## 2012-03-15 HISTORY — PX: CARPAL TUNNEL RELEASE: SHX101

## 2012-03-19 ENCOUNTER — Encounter (HOSPITAL_COMMUNITY): Payer: Self-pay | Admitting: Emergency Medicine

## 2012-03-19 DIAGNOSIS — R071 Chest pain on breathing: Secondary | ICD-10-CM | POA: Diagnosis not present

## 2012-03-19 DIAGNOSIS — B372 Candidiasis of skin and nail: Secondary | ICD-10-CM | POA: Diagnosis not present

## 2012-03-19 DIAGNOSIS — L02219 Cutaneous abscess of trunk, unspecified: Secondary | ICD-10-CM | POA: Diagnosis not present

## 2012-03-19 DIAGNOSIS — R21 Rash and other nonspecific skin eruption: Secondary | ICD-10-CM | POA: Diagnosis not present

## 2012-03-19 DIAGNOSIS — R209 Unspecified disturbances of skin sensation: Secondary | ICD-10-CM | POA: Diagnosis not present

## 2012-03-19 DIAGNOSIS — F17200 Nicotine dependence, unspecified, uncomplicated: Secondary | ICD-10-CM | POA: Insufficient documentation

## 2012-03-19 NOTE — ED Notes (Signed)
C/o abscess to L groin x 1 week.

## 2012-03-19 NOTE — ED Notes (Signed)
After triaging pt he now reports pain in center of chest with L arm numbness and sob since this morning.  Denies nausea and vomiting.

## 2012-03-20 ENCOUNTER — Emergency Department (HOSPITAL_COMMUNITY)
Admission: EM | Admit: 2012-03-20 | Discharge: 2012-03-20 | Disposition: A | Discharge status: Home / Self Care | Payer: Medicare Other | Attending: Emergency Medicine | Admitting: Emergency Medicine

## 2012-03-20 DIAGNOSIS — B372 Candidiasis of skin and nail: Secondary | ICD-10-CM

## 2012-03-20 DIAGNOSIS — R0789 Other chest pain: Secondary | ICD-10-CM

## 2012-03-20 LAB — POCT I-STAT TROPONIN I: Troponin i, poc: 0 ng/mL (ref 0.00–0.08)

## 2012-03-20 LAB — CBC
Hemoglobin: 14 g/dL (ref 13.0–17.0)
MCV: 90.1 fL (ref 78.0–100.0)
Platelets: 213 10*3/uL (ref 150–400)
RBC: 4.63 MIL/uL (ref 4.22–5.81)
WBC: 6.6 10*3/uL (ref 4.0–10.5)

## 2012-03-20 LAB — BASIC METABOLIC PANEL
CO2: 29 mEq/L (ref 19–32)
Chloride: 102 mEq/L (ref 96–112)
Sodium: 141 mEq/L (ref 135–145)

## 2012-03-20 MED ORDER — CLOTRIMAZOLE 1 % EX CREA
TOPICAL_CREAM | Freq: Two times a day (BID) | CUTANEOUS | Status: DC
  Administered 2012-03-20: 02:00:00 via TOPICAL
  Filled 2012-03-20: qty 15

## 2012-03-20 MED ORDER — IBUPROFEN 400 MG PO TABS
800.0000 mg | ORAL_TABLET | Freq: Once | ORAL | Status: AC
  Administered 2012-03-20: 800 mg via ORAL
  Filled 2012-03-20: qty 2

## 2012-03-20 NOTE — ED Provider Notes (Signed)
History     CSN: [id number]  Arrival date & time 03/19/12  2328   First MD Initiated Contact with Patient 03/20/12 [phone]      Chief Complaint  Patient presents with  . Abscess  . Chest Pain    (Consider location/radiation/quality/duration/timing/severity/associated sxs/prior treatment) HPI Comments: Patient states that his anterior L chest and L arm have been painful since lifting a car transmission over his head while lying on the ground yesterday.  He also states that he had an "abscess" in his L groin that burst yesterday and drained-- now the skin is sore and burning   Patient is a 42 y.o. male presenting with abscess and chest pain. The history is provided by the patient.  Abscess  This is a new problem. The current episode started yesterday. Pertinent negatives include no fever and no cough.  Chest Pain Pertinent negatives for primary symptoms include no fever and no cough.  Associated symptoms include numbness.     History reviewed. No pertinent past medical history.  Past Surgical History  Procedure Date  . Orif facial fracture   . Hand surgery 2004    No family history on file.  History  Substance Use Topics  . Smoking status: Current Every Day Smoker -- 2.0 packs/day    Types: Cigarettes  . Smokeless tobacco: Not on file  . Alcohol Use: No      Review of Systems  Constitutional: Negative for fever and chills.  HENT: Negative.   Eyes: Negative.   Respiratory: Negative for cough and chest tightness.   Cardiovascular: Positive for chest pain. Negative for leg swelling.  Gastrointestinal: Negative.   Genitourinary: Negative for penile swelling, scrotal swelling, penile pain and testicular pain.  Musculoskeletal: Negative for myalgias and joint swelling.  Skin: Positive for rash.  Neurological: Positive for numbness.    Allergies  Review of patient's allergies indicates no known allergies.  Home Medications   Current Outpatient Rx  Name  Route  Sig   Dispense  Refill  . HYDROCODONE-ACETAMINOPHEN 5-325 MG PO TABS   Oral   Take 2 tablets by mouth every 4 (four) hours as needed for pain.   15 tablet   0   . IBUPROFEN 800 MG PO TABS   Oral   Take 800 mg by mouth every 8 (eight) hours as needed. For pain           BP 135/85  Pulse 80  Temp 98.3 F (36.8 C) (Oral)  Resp 16  SpO2 100%  Physical Exam  Constitutional: He appears well-developed.  HENT:  Head: Normocephalic and atraumatic.  Eyes: Pupils are equal, round, and reactive to light.  Neck: Normal range of motion. Muscular tenderness present. No spinous process tenderness present. No rigidity. No erythema and normal range of motion present.    Cardiovascular: Normal rate and regular rhythm.   Pulmonary/Chest: Effort normal.  Abdominal: Soft.  Genitourinary: Penis normal.    Circumcised. No penile erythema or penile tenderness.    ED Course  Procedures (including critical care time)  Labs Reviewed  BASIC METABOLIC PANEL - Abnormal; Notable for the following:    Glucose, Bld 100 (*)     All other components within normal limits  CBC  POCT I-STAT TROPONIN I   No results found. ED ECG REPORT   Date: 03/20/2012  EKG Time: 1:49 AM  Rate: 81  Rhythm: normal sinus rhythm,  normal EKG, normal sinus rhythm, unchanged from previous tracings  Axis: normal  Intervals:none  ST&T Change: none  Narrative Interpretation: noraml            1. [place] intertrigo   2. Chest wall pain       MDM  Intertrigo L groin, chest wall pain         Arman Filter, NP 03/20/12 0149  Arman Filter, NP 03/20/12 0865

## 2012-03-20 NOTE — ED Provider Notes (Signed)
Medical screening examination/treatment/procedure(s) were performed by non-physician practitioner and as supervising physician I was immediately available for consultation/collaboration.   Glynn Octave, MD 03/20/12 [phone]

## 2012-03-20 NOTE — ED Notes (Signed)
Pt reports left groin pain and swelling for past two weeks. States that abscess "popped" yesterday and swelling dissipated. Reports pain has radiated up left groin to lower abdominal region. Reports groin pain when walking 8/10.

## 2012-09-22 ENCOUNTER — Emergency Department (HOSPITAL_COMMUNITY)
Admission: EM | Admit: 2012-09-22 | Discharge: 2012-09-22 | Disposition: A | Discharge status: Home / Self Care | Payer: Medicare Other | Attending: Emergency Medicine | Admitting: Emergency Medicine

## 2012-09-22 ENCOUNTER — Emergency Department (HOSPITAL_COMMUNITY): Payer: Medicare Other

## 2012-09-22 ENCOUNTER — Encounter (HOSPITAL_COMMUNITY): Payer: Self-pay | Admitting: Emergency Medicine

## 2012-09-22 DIAGNOSIS — R059 Cough, unspecified: Secondary | ICD-10-CM | POA: Diagnosis not present

## 2012-09-22 DIAGNOSIS — R1084 Generalized abdominal pain: Secondary | ICD-10-CM | POA: Insufficient documentation

## 2012-09-22 DIAGNOSIS — R0789 Other chest pain: Secondary | ICD-10-CM | POA: Diagnosis not present

## 2012-09-22 DIAGNOSIS — R071 Chest pain on breathing: Secondary | ICD-10-CM | POA: Diagnosis not present

## 2012-09-22 DIAGNOSIS — Z880 Allergy status to penicillin: Secondary | ICD-10-CM | POA: Insufficient documentation

## 2012-09-22 DIAGNOSIS — F17200 Nicotine dependence, unspecified, uncomplicated: Secondary | ICD-10-CM | POA: Insufficient documentation

## 2012-09-22 DIAGNOSIS — E876 Hypokalemia: Secondary | ICD-10-CM | POA: Diagnosis not present

## 2012-09-22 DIAGNOSIS — R05 Cough: Secondary | ICD-10-CM | POA: Insufficient documentation

## 2012-09-22 DIAGNOSIS — R209 Unspecified disturbances of skin sensation: Secondary | ICD-10-CM | POA: Insufficient documentation

## 2012-09-22 DIAGNOSIS — IMO0001 Reserved for inherently not codable concepts without codable children: Secondary | ICD-10-CM | POA: Diagnosis not present

## 2012-09-22 DIAGNOSIS — R21 Rash and other nonspecific skin eruption: Secondary | ICD-10-CM | POA: Insufficient documentation

## 2012-09-22 DIAGNOSIS — M791 Myalgia, unspecified site: Secondary | ICD-10-CM

## 2012-09-22 LAB — CBC WITH DIFFERENTIAL/PLATELET
Hemoglobin: 12.4 g/dL — ABNORMAL LOW (ref 13.0–17.0)
Lymphocytes Relative: 57 % — ABNORMAL HIGH (ref 12–46)
Lymphs Abs: 2.4 10*3/uL (ref 0.7–4.0)
Monocytes Relative: 6 % (ref 3–12)
Neutro Abs: 1.4 10*3/uL — ABNORMAL LOW (ref 1.7–7.7)
Neutrophils Relative %: 35 % — ABNORMAL LOW (ref 43–77)
RBC: 4.14 MIL/uL — ABNORMAL LOW (ref 4.22–5.81)

## 2012-09-22 LAB — COMPREHENSIVE METABOLIC PANEL
Albumin: 3.6 g/dL (ref 3.5–5.2)
Alkaline Phosphatase: 59 U/L (ref 39–117)
BUN: 13 mg/dL (ref 6–23)
Chloride: 104 mEq/L (ref 96–112)
GFR calc Af Amer: >90 mL/min (ref >90)
Glucose, Bld: 97 mg/dL (ref 70–99)
Potassium: 2.9 mEq/L — ABNORMAL LOW (ref 3.5–5.1)
Total Bilirubin: 0.6 mg/dL (ref 0.3–1.2)

## 2012-09-22 LAB — LIPASE, BLOOD: Lipase: 49 U/L (ref 11–59)

## 2012-09-22 LAB — POCT I-STAT TROPONIN I

## 2012-09-22 MED ORDER — MORPHINE SULFATE 4 MG/ML IJ SOLN
4.0000 mg | Freq: Once | INTRAMUSCULAR | Status: AC
  Administered 2012-09-22: 4 mg via INTRAVENOUS
  Filled 2012-09-22: qty 1

## 2012-09-22 MED ORDER — ONDANSETRON HCL 4 MG/2ML IJ SOLN
4.0000 mg | Freq: Once | INTRAMUSCULAR | Status: AC
  Administered 2012-09-22: 4 mg via INTRAVENOUS
  Filled 2012-09-22: qty 2

## 2012-09-22 MED ORDER — HYDROCODONE-ACETAMINOPHEN 5-325 MG PO TABS: 1.0000 | ORAL_TABLET | ORAL | Status: DC | PRN

## 2012-09-22 MED ORDER — POTASSIUM CHLORIDE 10 MEQ/100ML IV SOLN
10.0000 meq | Freq: Once | INTRAVENOUS | Status: AC
  Administered 2012-09-22: 10 meq via INTRAVENOUS
  Filled 2012-09-22: qty 100

## 2012-09-22 MED ORDER — POTASSIUM CHLORIDE CRYS ER 20 MEQ PO TBCR: 20.0000 meq | EXTENDED_RELEASE_TABLET | Freq: Every day | ORAL | Status: DC

## 2012-09-22 MED ORDER — SODIUM CHLORIDE 0.9 % IV BOLUS (SEPSIS)
1000.0000 mL | Freq: Once | INTRAVENOUS | Status: AC
  Administered 2012-09-22: 1000 mL via INTRAVENOUS

## 2012-09-22 MED ORDER — ASPIRIN 81 MG PO CHEW
324.0000 mg | CHEWABLE_TABLET | Freq: Once | ORAL | Status: AC
  Administered 2012-09-22: 324 mg via ORAL
  Filled 2012-09-22: qty 4

## 2012-09-22 NOTE — ED Provider Notes (Signed)
History    CSN: [id number] Arrival date & time 09/22/12  0145  First MD Initiated Contact with Patient 09/22/12 [phone]     Chief Complaint  Patient presents with  . Shortness of Breath   (Consider location/radiation/quality/duration/timing/severity/associated sxs/prior Treatment) HPI Comments: Patient reports sudden onset of stabbing pain in his chest and shortness of breath, worse with walking, deep inspiration, and palpation.  Pt has multiple areas of pain including pain that radiates from his right neck to right knee with occasional numbness, also generalized abdominal pain, worse on the right.  States this all began yesterday at home after a day of heavy lifting (transmission, car motor).  Denies any known injury while working/lifting yesterday.  Denies family or personal hx of blood clots but notes strong family hx of MI and stroke.  Denies fevers, chills, sore throat, nasal congestion, abdominal pain, N/V/D, urinary symptoms.    Pt also notes thickened pruritic skin in the left groin x 3 months, unrelieved with clotrimazole. Unchanged.  Denies new lesions.    Patient is a 42 y.o. male presenting with shortness of breath. The history is provided by the patient.  Shortness of Breath Associated symptoms: chest pain, cough and rash   Associated symptoms: no abdominal pain, no fever, no sore throat and no vomiting    History reviewed. No pertinent past medical history. Past Surgical History  Procedure Laterality Date  . Orif facial fracture    . Hand surgery  2004   No family history on file. History  Substance Use Topics  . Smoking status: Current Every Day Smoker -- 2.00 packs/day    Types: Cigarettes  . Smokeless tobacco: Not on file  . Alcohol Use: No    Review of Systems  Constitutional: Negative for fever and chills.  HENT: Negative for congestion and sore throat.   Respiratory: Positive for cough and shortness of breath.   Cardiovascular: Positive for chest pain.   Gastrointestinal: Negative for nausea, vomiting, abdominal pain and diarrhea.  Genitourinary: Negative for dysuria, urgency and frequency.  Skin: Positive for rash.  Neurological: Positive for numbness. Negative for weakness.    Allergies  Penicillins  Home Medications  No current outpatient prescriptions on file. BP 95/52  Pulse 57  Temp(Src) 98 F (36.7 C) (Oral)  Resp 17  SpO2 98% Physical Exam  Nursing note and vitals reviewed. Constitutional: He appears well-developed and well-nourished. No distress.  Pt is tender all over, anywhere he is touched he grimaces  HENT:  Head: Normocephalic and atraumatic.  Neck: Neck supple.  Cardiovascular: Normal rate and regular rhythm.   Pulmonary/Chest: Effort normal and breath sounds normal. No respiratory distress. He has no wheezes. He has no rales. He exhibits tenderness.  Abdominal: Soft. He exhibits no distension and no mass. There is generalized tenderness. There is no rebound and no guarding.  Musculoskeletal: He exhibits no edema.  Neurological: He is alert. He has normal strength. No sensory deficit. He exhibits normal muscle tone. Coordination normal. GCS eye subscore is 4. GCS verbal subscore is 5. GCS motor subscore is 6.  Moves all extremities without difficulties.    Lower extremities:  Strength 5/5, sensation intact, distal pulses intact.     Skin: He is not diaphoretic.    ED Course  Procedures (including critical care time) Labs Reviewed  CBC WITH DIFFERENTIAL - Abnormal; Notable for the following:    RBC 4.14 (*)    Hemoglobin 12.4 (*)    HCT 36.7 (*)    Neutrophils  Relative % 35 (*)    Neutro Abs 1.4 (*)    Lymphocytes Relative 57 (*)    All other components within normal limits  COMPREHENSIVE METABOLIC PANEL - Abnormal; Notable for the following:    Potassium 2.9 (*)    All other components within normal limits  CK - Abnormal; Notable for the following:    Total CK 449 (*)    All other components within  normal limits  LIPASE, BLOOD  D-DIMER, QUANTITATIVE  POCT I-STAT TROPONIN I   7:16 AM Discussed pt with Dr Read Drivers who has also seen the patient.   Dg Chest 2 View  09/22/2012   *RADIOLOGY REPORT*  Clinical Data: Shortness of breath and chest tightness.  CHEST - 2 VIEW  Comparison: 01/18/2011  Findings: Pulmonary hyperinflation. The heart size and pulmonary vascularity are normal. The lungs appear clear and expanded without focal air space disease or consolidation. No blunting of the costophrenic angles.  No pneumothorax.  Mediastinal contours appear intact.  No significant change since previous study.  IMPRESSION: No evidence of active pulmonary disease.   Original Report Authenticated By: Burman Nieves, M.D.   1. Myalgia   2. Chest wall pain   3. Hypokalemia     MDM  Pt with chest wall pain and pain all over after heavy lifting yesterday.   CXR, d-dimer negative, troponin negative after constant pain since yesterday.  Pt slightly dehydrated, given IVF.  Labs show anemia, otherwise unremarkable.  Pt feeling better after IVF and pain medication.  Potassium given in ED and prescribed for home.  #10 vicodin given for home.  PCP follow up. Discussed all results with patient.  Pt given return precautions.  Pt verbalizes understanding and agrees with plan.     I doubt any other EMC precluding discharge at this time including, but not necessarily limited to the following:  PE, ACS, stroke  [place], [place] 09/22/12 [phone]

## 2012-09-22 NOTE — ED Notes (Signed)
Pt states he is having some perineal itching and states in the past he has been told it is fungal, but thinks this is not the case because the creams he has been given are not working.

## 2012-09-22 NOTE — ED Notes (Signed)
Pt given food and drink.

## 2012-09-22 NOTE — ED Notes (Signed)
PT. REPORTS SOB WITH OCCASIONAL DRY COUGH ONSET THIS EVENING , ALSO REPORTS EMESIS X2 YESTERDAY MORNING AND ITCHY "BUMPS' AT LEFT GROIN .

## 2012-09-22 NOTE — ED Provider Notes (Signed)
Medical screening examination/treatment/procedure(s) were performed by non-physician practitioner and as supervising physician I was immediately available for consultation/collaboration.  Diffuse chest wall and abdominal wall tenderness. Lungs clear to auscultation.  Hanley Seamen, MD 09/22/12 [phone]

## 2012-09-24 ENCOUNTER — Emergency Department (HOSPITAL_COMMUNITY)
Admission: EM | Admit: 2012-09-24 | Discharge: 2012-09-24 | Disposition: A | Discharge status: Home / Self Care | Payer: Medicare Other | Attending: Emergency Medicine | Admitting: Emergency Medicine

## 2012-09-24 ENCOUNTER — Encounter (HOSPITAL_COMMUNITY): Payer: Self-pay

## 2012-09-24 ENCOUNTER — Emergency Department (HOSPITAL_COMMUNITY): Payer: Medicare Other

## 2012-09-24 DIAGNOSIS — L539 Erythematous condition, unspecified: Secondary | ICD-10-CM | POA: Diagnosis not present

## 2012-09-24 DIAGNOSIS — Y9389 Activity, other specified: Secondary | ICD-10-CM | POA: Insufficient documentation

## 2012-09-24 DIAGNOSIS — F17200 Nicotine dependence, unspecified, uncomplicated: Secondary | ICD-10-CM | POA: Diagnosis not present

## 2012-09-24 DIAGNOSIS — R21 Rash and other nonspecific skin eruption: Secondary | ICD-10-CM | POA: Diagnosis not present

## 2012-09-24 DIAGNOSIS — Z880 Allergy status to penicillin: Secondary | ICD-10-CM | POA: Insufficient documentation

## 2012-09-24 DIAGNOSIS — Y929 Unspecified place or not applicable: Secondary | ICD-10-CM | POA: Insufficient documentation

## 2012-09-24 DIAGNOSIS — R51 Headache: Secondary | ICD-10-CM | POA: Diagnosis not present

## 2012-09-24 DIAGNOSIS — R42 Dizziness and giddiness: Secondary | ICD-10-CM | POA: Diagnosis not present

## 2012-09-24 DIAGNOSIS — S0990XA Unspecified injury of head, initial encounter: Secondary | ICD-10-CM | POA: Insufficient documentation

## 2012-09-24 DIAGNOSIS — W1809XA Striking against other object with subsequent fall, initial encounter: Secondary | ICD-10-CM | POA: Insufficient documentation

## 2012-09-24 MED ORDER — FLUCONAZOLE 100 MG PO TABS
100.0000 mg | ORAL_TABLET | Freq: Once | ORAL | Status: AC
  Administered 2012-09-24: 100 mg via ORAL
  Filled 2012-09-24: qty 1

## 2012-09-24 MED ORDER — ACETAMINOPHEN 325 MG PO TABS
650.0000 mg | ORAL_TABLET | Freq: Once | ORAL | Status: AC
  Administered 2012-09-24: 650 mg via ORAL
  Filled 2012-09-24: qty 2

## 2012-09-24 NOTE — ED Notes (Signed)
No changes from triage assessment 

## 2012-09-24 NOTE — ED Provider Notes (Signed)
Medical screening examination/treatment/procedure(s) were performed by non-physician practitioner and as supervising physician I was immediately available for consultation/collaboration.  John-Adam Pranav Lince, M.D.     John-Adam Renell Coaxum, MD 09/24/12 0842 

## 2012-09-24 NOTE — ED Provider Notes (Signed)
Medical screening examination/treatment/procedure(s) were performed by non-physician practitioner and as supervising physician I was immediately available for consultation/collaboration.  Jones Skene, M.D.     Jones Skene, MD 09/24/12 [phone]

## 2012-09-24 NOTE — ED Notes (Signed)
Pt. C/o HA since last night, said hit head on door. Denies hx of migraines. Also c/o boil in groin area. Seen here yesterday for same.

## 2012-09-24 NOTE — ED Provider Notes (Signed)
   History    CSN: [id number] Arrival date & time 09/24/12  4098  First MD Initiated Contact with Patient 09/24/12 0554     Chief Complaint  Patient presents with  . Headache  . Abscess   (Consider location/radiation/quality/duration/timing/severity/associated sxs/prior Treatment) HPI Comments: PAteint fell on the stairs while ascending  Hitting his head on a door frame + dizziness and headache . Also has been treating tinea crudis with topical ointment without relief   Patient is a 42 y.o. male presenting with headaches and abscess.  Headache Pain location:  Frontal Severity currently:  3/10 Onset quality:  Sudden Duration:  8 hours Timing:  Constant Relieved by:  NSAIDs Associated symptoms: dizziness   Associated symptoms: no fever, no neck pain and no neck stiffness   Abscess Associated symptoms: headaches   Associated symptoms: no fever    History reviewed. No pertinent past medical history. Past Surgical History  Procedure Laterality Date  . Orif facial fracture    . Hand surgery  2004   No family history on file. History  Substance Use Topics  . Smoking status: Current Every Day Smoker -- 2.00 packs/day    Types: Cigarettes  . Smokeless tobacco: Not on file  . Alcohol Use: No    Review of Systems  Constitutional: Negative for fever and chills.  HENT: Negative for neck pain and neck stiffness.   Skin: Positive for rash.  Neurological: Positive for dizziness and headaches.  All other systems reviewed and are negative.    Allergies  Penicillins  Home Medications   Current Outpatient Rx  Name  Route  Sig  Dispense  Refill  . HYDROcodone-acetaminophen (NORCO/VICODIN) 5-325 MG per tablet   Oral   Take 1 tablet by mouth every 4 (four) hours as needed for pain.   10 tablet   0   . potassium chloride SA (K-DUR,KLOR-CON) 20 MEQ tablet   Oral   Take 1 tablet (20 mEq total) by mouth daily.   10 tablet   0    BP 139/80  Pulse 73  Temp(Src) 97.3 F  (36.3 C) (Oral)  Resp 16  SpO2 99% Physical Exam  Vitals reviewed. Constitutional: He appears well-developed and well-nourished.  HENT:  Head: Normocephalic.  Eyes: Pupils are equal, round, and reactive to light.  Neck: Normal range of motion.  Cardiovascular: Normal rate and regular rhythm.   Pulmonary/Chest: Effort normal.  Abdominal: Soft. Bowel sounds are normal.  Genitourinary: Penis normal.  Thick white coating in grion folds   Musculoskeletal: Normal range of motion.  Skin: Skin is warm and dry. Rash noted. There is erythema.    ED Course  Procedures (including critical care time) Labs Reviewed - No data to display No results found. No diagnosis found.  MDM  Will ct head , will start Fluconazole PO for tinea infection    Arman Filter, NP 09/24/12 [phone]

## 2012-09-24 NOTE — ED Provider Notes (Signed)
Patient care assumed from Earley Favor, FNP at shift change with imaging pending.  42 year old male presents with headache with onset last night. Patient given ibuprofen for symptoms. CT head without evidence of hemorrhage, hydrocephalus, mass lesion, or other acute intracranial abnormalities. Symptoms consistent with uncomplicated headache. Patient appropriate for discharge with primary care followup for further evaluation of symptoms. Indications for ED return provided.  Ct Head Wo Contrast  09/24/2012   *RADIOLOGY REPORT*  Clinical Data: The patient fell and hit head.  Struck the left side of forehead.  The increasing headache.  CT HEAD WITHOUT CONTRAST  Technique:  Contiguous axial images were obtained from the base of the skull through the vertex without contrast.  Comparison: 07/08/2007  Findings: The ventricles and sulci are symmetrical without significant effacement, displacement, or dilatation. No mass effect or midline shift. No abnormal extra-axial fluid collections. The grey-white matter junction is distinct. Basal cisterns are not effaced. No acute intracranial hemorrhage. No depressed skull fractures.  Visualized paranasal sinuses and mastoid air cells are not opacified.  No significant change since previous study.  IMPRESSION: No acute intracranial abnormalities.   Original Report Authenticated By: Burman Nieves, M.D.      Antony Madura, PA-C 09/24/12 [phone]

## 2012-10-01 ENCOUNTER — Encounter (HOSPITAL_COMMUNITY): Payer: Self-pay | Admitting: Emergency Medicine

## 2012-10-01 ENCOUNTER — Emergency Department (HOSPITAL_COMMUNITY)
Admission: EM | Admit: 2012-10-01 | Discharge: 2012-10-01 | Disposition: A | Discharge status: Home / Self Care | Payer: Medicare Other | Attending: Emergency Medicine | Admitting: Emergency Medicine

## 2012-10-01 DIAGNOSIS — E876 Hypokalemia: Secondary | ICD-10-CM | POA: Insufficient documentation

## 2012-10-01 DIAGNOSIS — Z880 Allergy status to penicillin: Secondary | ICD-10-CM | POA: Diagnosis not present

## 2012-10-01 DIAGNOSIS — M545 Low back pain, unspecified: Secondary | ICD-10-CM | POA: Diagnosis not present

## 2012-10-01 DIAGNOSIS — M549 Dorsalgia, unspecified: Secondary | ICD-10-CM

## 2012-10-01 DIAGNOSIS — Z8781 Personal history of (healed) traumatic fracture: Secondary | ICD-10-CM | POA: Diagnosis not present

## 2012-10-01 DIAGNOSIS — F17200 Nicotine dependence, unspecified, uncomplicated: Secondary | ICD-10-CM | POA: Diagnosis not present

## 2012-10-01 HISTORY — DX: Hypokalemia: E87.6

## 2012-10-01 MED ORDER — TRAMADOL HCL 50 MG PO TABS
50.0000 mg | ORAL_TABLET | Freq: Once | ORAL | Status: AC
  Administered 2012-10-01: 50 mg via ORAL
  Filled 2012-10-01: qty 1

## 2012-10-01 MED ORDER — TRAMADOL HCL 50 MG PO TABS: 50.0000 mg | ORAL_TABLET | Freq: Four times a day (QID) | ORAL | Status: DC | PRN

## 2012-10-01 NOTE — ED Notes (Addendum)
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 Ambulatory to triage.

## 2012-10-01 NOTE — ED Provider Notes (Signed)
History  This chart was scribed for Sharilyn Sites, PA-C working with Candyce Churn, MD by Greggory Stallion, ED scribe. This patient was seen in room TR11C/TR11C and the patient's care was started at 10:47 PM.  CSN: [id number] Arrival date & time 10/01/12  2156   Chief Complaint  Patient presents with  . Back Pain   The history is provided by the patient. No language interpreter was used.    HPI Comments: [name] is a 42 y.o. male who presents to the Emergency Department complaining of sudden onset, constant sharp back pain that started earlier today. Pt states he was trying to lift a transmission and felt a sharp pain in his back. Transmission did not fall on him or hit him in any way.  He states pain radiates down to his buttocks into the back of his right thigh. No numbness or paresthesias of LE.  No loss of bowel or bladder function.  No pain meds taken PTA.  No prior back injury.   Past Medical History  Diagnosis Date  . Hypokalemia    Past Surgical History  Procedure Laterality Date  . Orif facial fracture    . Hand surgery  2004   No family history on file. History  Substance Use Topics  . Smoking status: Current Every Day Smoker -- 2.00 packs/day    Types: Cigarettes  . Smokeless tobacco: Not on file  . Alcohol Use: No    Review of Systems  Musculoskeletal: Positive for back pain.  All other systems reviewed and are negative.    Allergies  Penicillins  Home Medications   Current Outpatient Rx  Name  Route  Sig  Dispense  Refill  . HYDROcodone-acetaminophen (NORCO/VICODIN) 5-325 MG per tablet   Oral   Take 1 tablet by mouth every 4 (four) hours as needed for pain.   10 tablet   0   . potassium chloride SA (K-DUR,KLOR-CON) 20 MEQ tablet   Oral   Take 1 tablet (20 mEq total) by mouth daily.   10 tablet   0    BP 144/88  Pulse 68  Temp(Src) 97.6 F (36.4 C) (Oral)  Resp 16  SpO2 98%  Physical Exam  Nursing note and vitals  reviewed. Constitutional: He is oriented to person, place, and time. He appears well-developed and well-nourished. No distress.  HENT:  Head: Normocephalic and atraumatic.  Mouth/Throat: Oropharynx is clear and moist.  Eyes: Conjunctivae and EOM are normal. Pupils are equal, round, and reactive to light.  Neck: Normal range of motion. Neck supple.  Cardiovascular: Normal rate, regular rhythm and normal heart sounds.   Pulmonary/Chest: Effort normal and breath sounds normal. No respiratory distress.  Musculoskeletal: Normal range of motion.       Lumbar back: He exhibits tenderness, bony tenderness and pain. He exhibits normal range of motion, no swelling, no edema, no deformity, no laceration and no spasm.       Back:  Tenderness to palpation over right SI joint, full ROM maintained, distal sensation intact, normal gait  Neurological: He is alert and oriented to person, place, and time.  Skin: Skin is warm and dry.  Psychiatric: He has a normal mood and affect.    ED Course  Procedures (including critical care time)  DIAGNOSTIC STUDIES: Oxygen Saturation is 98% on RA, normal by my interpretation.    COORDINATION OF CARE: 10:50 PM-Discussed treatment plan which includes pain medication with pt at bedside and pt agreed to plan.  Labs Reviewed - No data to display No results found.  1. Back pain     MDM   Back pain without traumatic event, imaging deferred. No concern for cauda equina. Rx tramadol. Followup with primary care physician if symptoms not improving in the next few days. Discussed plan with patient, he agreed. Return precautions advised.  I personally performed the services described in this documentation, which was scribed in my presence. The recorded information has been reviewed and is accurate.   Garlon Hatchet, PA-C 10/01/12 2258

## 2012-10-04 NOTE — ED Provider Notes (Signed)
Medical screening examination/treatment/procedure(s) were performed by non-physician practitioner and as supervising physician I was immediately available for consultation/collaboration.  Candyce Churn, MD 10/04/12 1059

## 2012-11-01 ENCOUNTER — Encounter (HOSPITAL_COMMUNITY): Payer: Self-pay | Admitting: Emergency Medicine

## 2012-11-01 ENCOUNTER — Emergency Department (INDEPENDENT_AMBULATORY_CARE_PROVIDER_SITE_OTHER)
Admission: EM | Admit: 2012-11-01 | Discharge: 2012-11-01 | Disposition: A | Discharge status: Home / Self Care | Payer: Medicare Other | Source: Home / Self Care | Attending: Family Medicine | Admitting: Family Medicine

## 2012-11-01 DIAGNOSIS — T7840XA Allergy, unspecified, initial encounter: Secondary | ICD-10-CM

## 2012-11-01 DIAGNOSIS — R21 Rash and other nonspecific skin eruption: Secondary | ICD-10-CM

## 2012-11-01 MED ORDER — HYDROCORTISONE 1 % EX CREA: TOPICAL_CREAM | Freq: Two times a day (BID) | CUTANEOUS | Status: DC

## 2012-11-01 MED ORDER — PREDNISONE 50 MG PO TABS: 50.0000 mg | ORAL_TABLET | Freq: Every day | ORAL | Status: DC

## 2012-11-01 NOTE — ED Provider Notes (Signed)
CSN: [id number]     Arrival date & time 11/01/12  1634 History     First MD Initiated Contact with Patient 11/01/12 1743     Chief Complaint  Patient presents with  . Rash   (Consider location/radiation/quality/duration/timing/severity/associated sxs/prior Treatment) Patient is a 42 y.o. male presenting with rash. The history is provided by the patient.  Rash Pain location: bilat upper extremities, hands spared. Pain quality: burning   Pain quality: not aching   Pain radiates to:  Does not radiate Pain severity:  Moderate Onset quality:  Gradual Timing:  Constant Progression:  Worsening Chronicity:  New Exacerbated by: worse w/ sun and heat. Associated symptoms: no chest pain, no diarrhea, no fever, no nausea, no shortness of breath, no sore throat and no vomiting    itching. 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 No previous reaction like this to mulch. Denies any exposure to poison ivy but does not know what poison oak or sumac looks like. Pt working all through the brush that day.   Pt long term girlfriend who also did some of the mulch work, but not in the bushes, does not have a rash.    Past Medical History  Diagnosis Date  . Hypokalemia    Past Surgical History  Procedure Laterality Date  . Orif facial fracture    . Hand surgery  2004   No family history on file. History  Substance Use Topics  . Smoking status: Current Every Day Smoker -- 2.00 packs/day    Types: Cigarettes  . Smokeless tobacco: Not on file  . Alcohol Use: No    Review of Systems  Constitutional: Negative for fever, diaphoresis and activity change.  HENT: Negative for sore throat, facial swelling, trouble swallowing, neck pain, neck stiffness and voice change.   Respiratory: Negative for shortness of breath, wheezing and stridor.   Cardiovascular: Negative for chest pain and palpitations.  Gastrointestinal: Negative for nausea, vomiting, abdominal pain, diarrhea and  abdominal distention.  Skin: Positive for rash.  All other systems reviewed and are negative.    Allergies  Penicillins  Home Medications   Current Outpatient Rx  Name  Route  Sig  Dispense  Refill  . HYDROcodone-acetaminophen (NORCO/VICODIN) 5-325 MG per tablet   Oral   Take 1 tablet by mouth every 4 (four) hours as needed for pain.   10 tablet   0   . potassium chloride SA (K-DUR,KLOR-CON) 20 MEQ tablet   Oral   Take 1 tablet (20 mEq total) by mouth daily.   10 tablet   0   . traMADol (ULTRAM) 50 MG tablet   Oral   Take 1 tablet (50 mg total) by mouth every 6 (six) hours as needed for pain.   20 tablet   0    BP 93/64  Pulse 56  Temp(Src) 97.3 F (36.3 C) (Oral)  Resp 16  SpO2 100% Physical Exam  Constitutional: He appears well-developed and well-nourished. No distress.  HENT:  Head: Normocephalic and atraumatic.  Eyes: EOM are normal. Pupils are equal, round, and reactive to light.  Neck: Normal range of motion. Neck supple.  Pulmonary/Chest: Effort normal. No respiratory distress.  Abdominal: He exhibits no distension.  Musculoskeletal: Normal range of motion.  Skin: Skin is warm.  Very small papular (possible vesicles) rash diffusely on flexor surfaces of arms. No discharge. Hands spared.   Psychiatric: He has a normal mood and affect. His behavior is normal.    ED Course  Procedures (including critical care time)  Labs Reviewed - No data to display No results found. No diagnosis found.  MDM  42 yo Male w/ possible allergic reaction type rash vs scabies - Prednisone 60mg  x5 days. - hydrocortisone cream - precautions givena dn all questions answered If rash does not improve or if bed mate develops similar rash would recommend treating for scabies  Shelly Flatten, MD Family Medicine PGY-3 11/01/2012, 5:52 PM    Ozella Rocks, MD 11/01/12 [phone]

## 2012-11-01 NOTE — ED Notes (Signed)
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 He is alert w/no signs of acute distress.

## 2012-11-02 NOTE — ED Provider Notes (Signed)
Medical screening examination/treatment/procedure(s) were performed by resident physician or non-physician practitioner and as supervising physician I was immediately available for consultation/collaboration.   Rainah Kirshner DOUGLAS MD.   Remingtyn Depaola D Ameirah Khatoon, MD 11/02/12 1734 

## 2013-01-13 IMAGING — CR DG ABDOMEN 1V
1 series · 1 of 1 positions shown · non-contrast
Comparison: None

CLINICAL DATA: Groin pain.

ABDOMEN - 1 VIEW

[t abdomen supine]
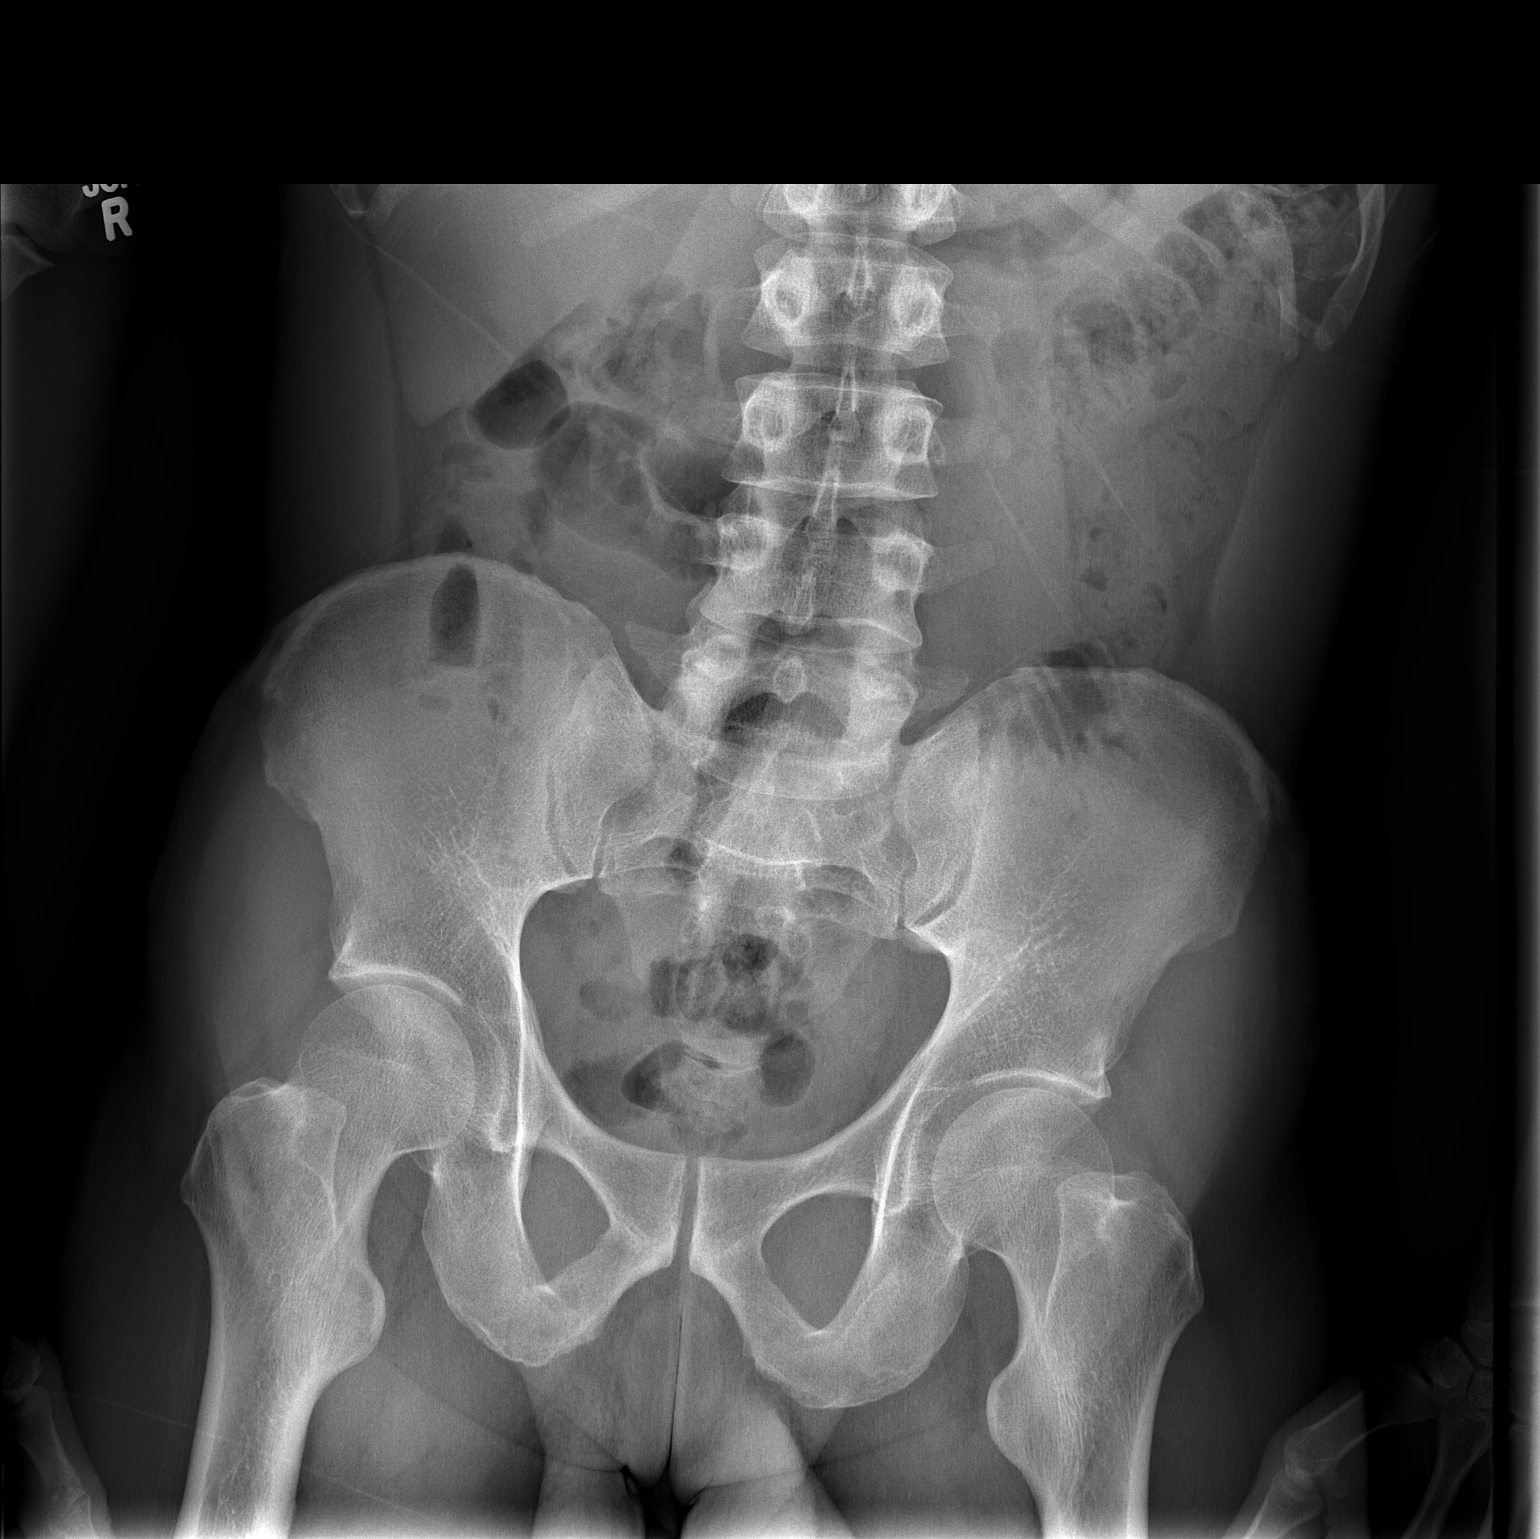

[1 of 1 positions shown; findings below may reference images not displayed]

FINDINGS: There is a nonobstructive bowel gas pattern.  No supine
evidence of free air.  No organomegaly or suspicious calcification.

No acute bony abnormality.
IMPRESSION: Normal study.

## 2013-01-13 IMAGING — CT CT ABD-PELV W/ CM
2 of 6 series · 17 of 46 positions shown, 19 images · IV contrast (APPLIED)
Comparison: Plain films earlier today.

CLINICAL DATA: right groin pain, question hernia.

CT ABDOMEN AND PELVIS WITH CONTRAST
TECHNIQUE: Multidetector CT imaging of the abdomen and pelvis was
performed following the standard protocol during bolus
administration of intravenous contrast.
Contrast: 100mL OMNIPAQUE IOHEXOL 300 MG/ML  SOLN

[Series 2: abd/pelv with 5.0 b31f st · axial · 0.62mm/px · z∈[-429,-99]mm · 14 of 76 slices shown, 16 images]
[im 5/76  soft-tissue]
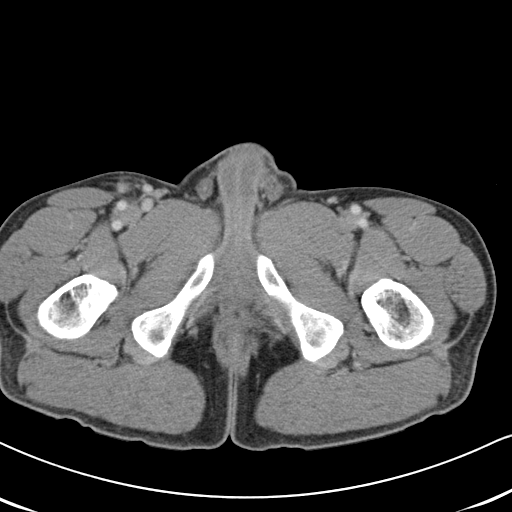
[im 5/76  bone]
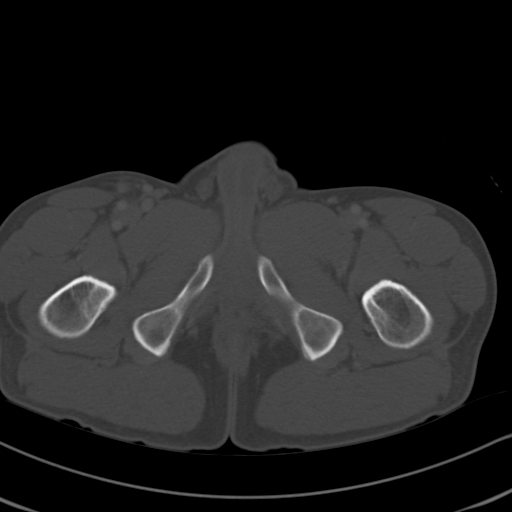
[im 9/76  soft-tissue]
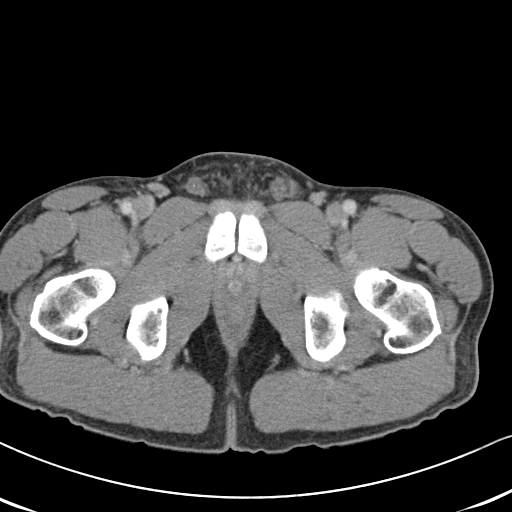
[im 17/76  soft-tissue]
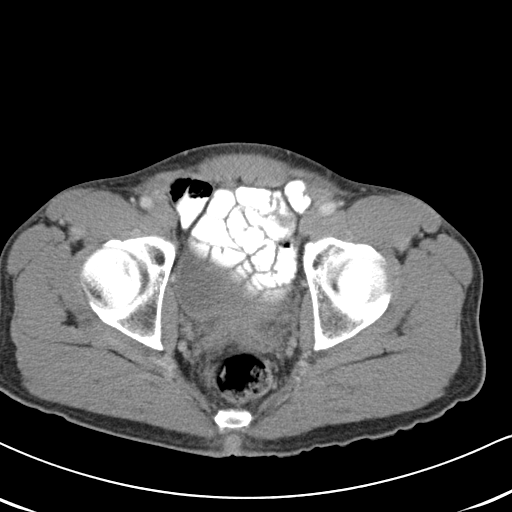
[im 21/76  soft-tissue]
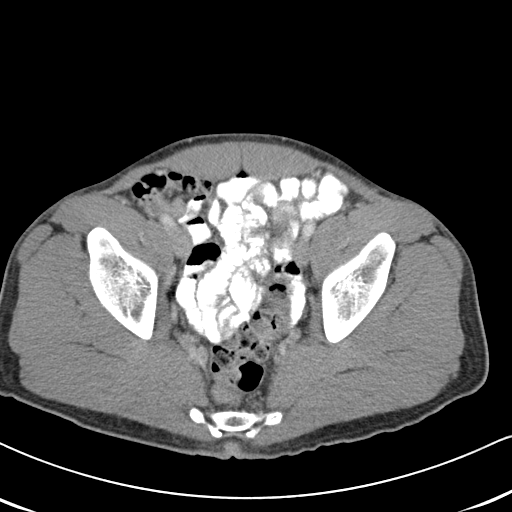
[im 26/76  soft-tissue]
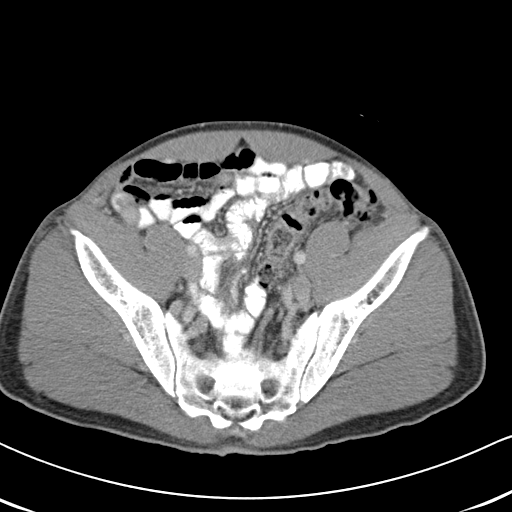
[im 30/76  soft-tissue]
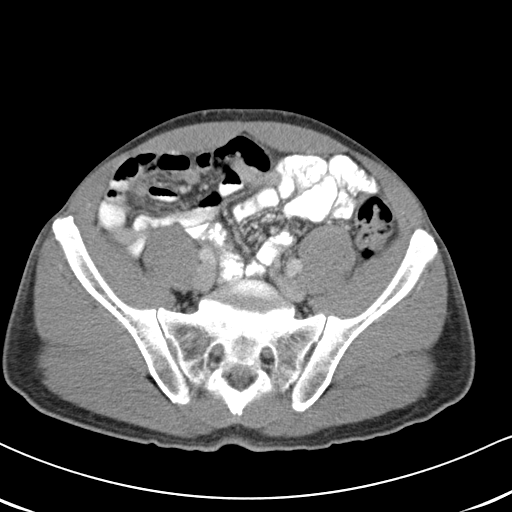
[im 34/76  soft-tissue]
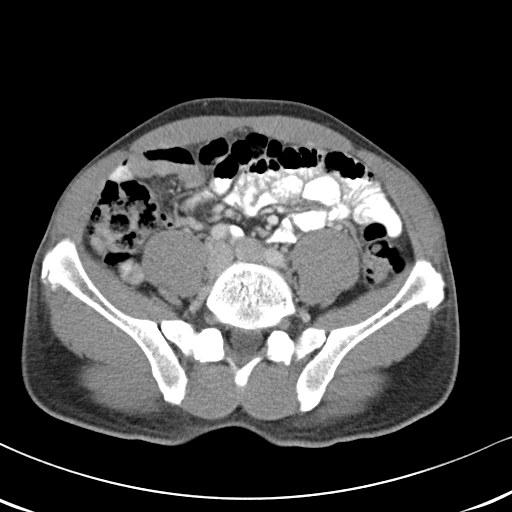
[im 42/76  soft-tissue]
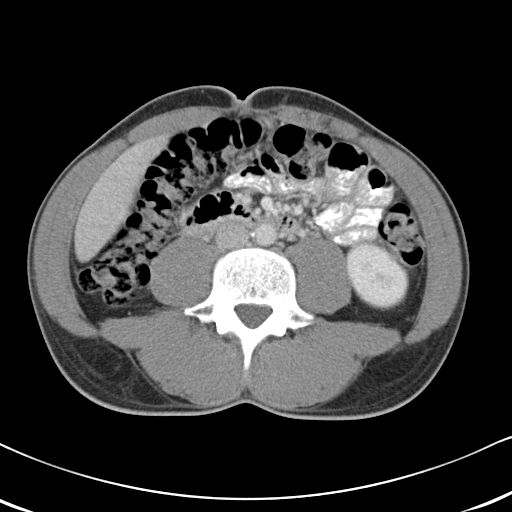
[im 46/76  soft-tissue]
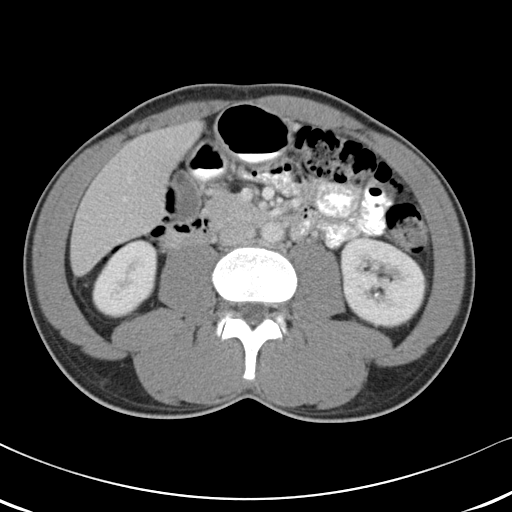
[im 46/76  bone]
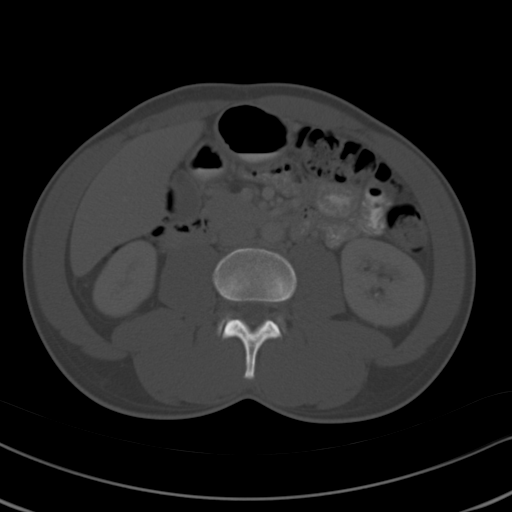
[im 51/76  soft-tissue]
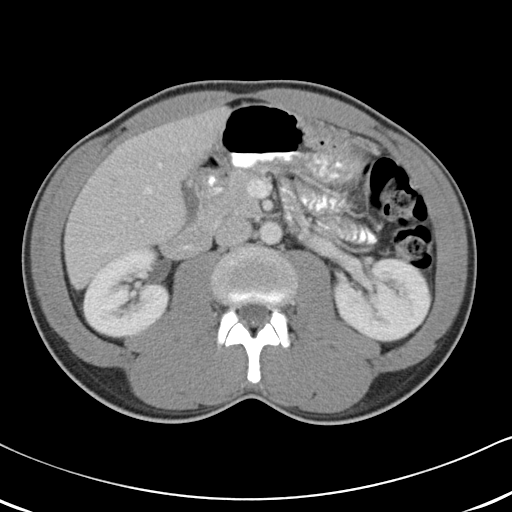
[im 55/76  soft-tissue]
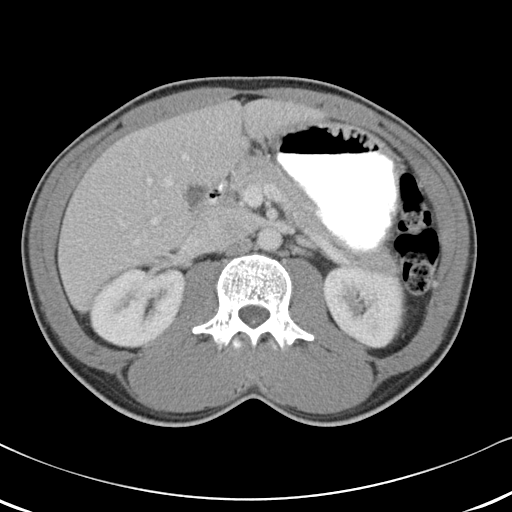
[im 59/76  soft-tissue]
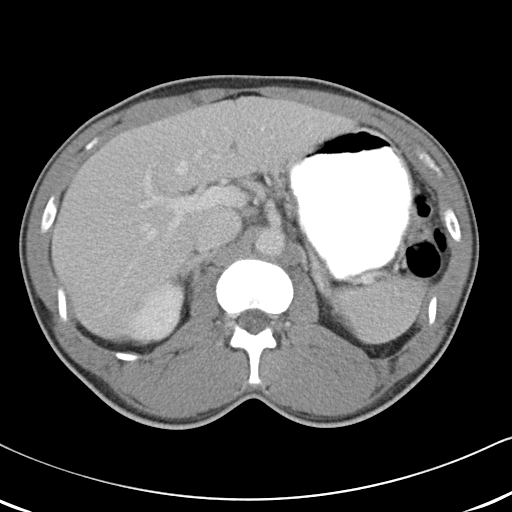
[im 67/76  soft-tissue]
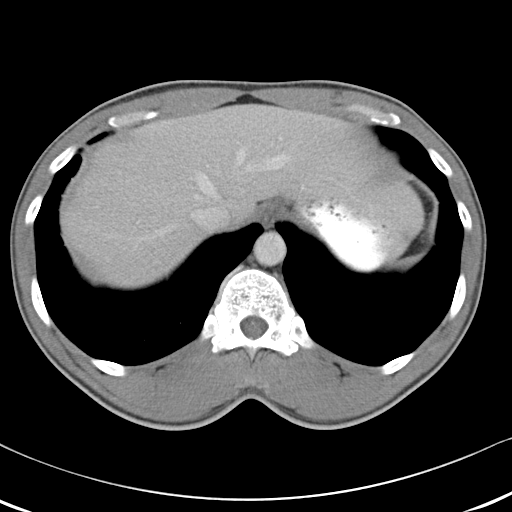
[im 71/76  soft-tissue]
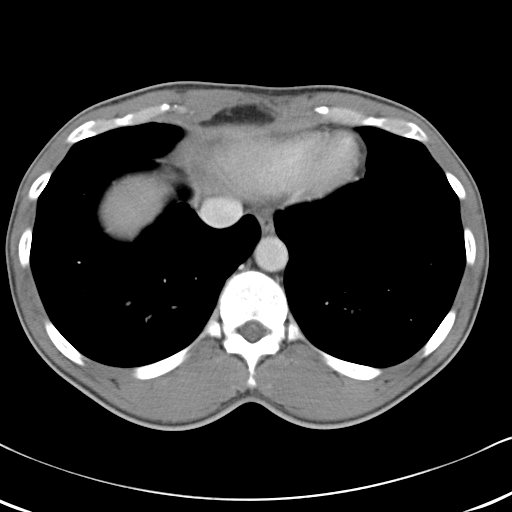

[Series 602: <mpr thick coronals · coronal · 0.74mm/px · 3 of 67 slices shown]
[im 23/67  soft-tissue]
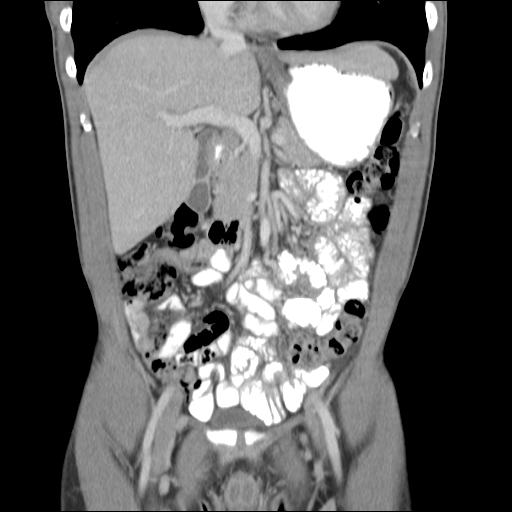
[im 30/67  soft-tissue]
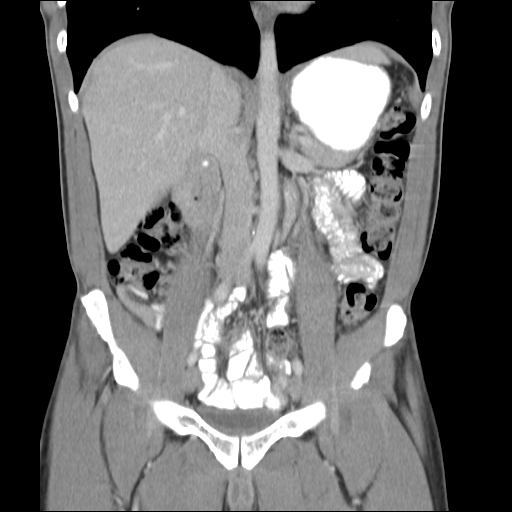
[im 37/67  soft-tissue]
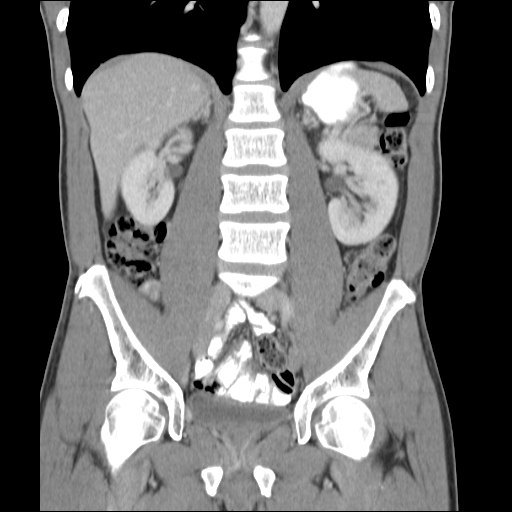

[17 of 46 positions shown; findings below may reference images not displayed]

FINDINGS: Lung bases are clear.  No effusions.  Heart is normal
size.

Liver, gallbladder, spleen, pancreas, adrenals and kidneys are
unremarkable except for a tiny nonobstructing stone in the lower
pole of the left kidney.  No hydronephrosis or ureteral stones.
Urinary bladder is unremarkable.

Appendix is visualized and is normal.  Stomach, large and small
bowel are normal.  No free fluid or free air.

There are mildly enlarged bilateral inguinal lymph nodes, right
larger than left.  Largest right inguinal lymph node has a short
axis diameter of 16 mm.  No hernia.  No acute bony abnormality.
IMPRESSION: Right inguinal adenopathy.  Borderline sized left inguinal lymph
nodes.

No acute findings in the abdomen or pelvis.

## 2013-03-15 HISTORY — PX: CARPAL TUNNEL RELEASE: SHX101

## 2013-04-12 DIAGNOSIS — G5600 Carpal tunnel syndrome, unspecified upper limb: Secondary | ICD-10-CM | POA: Diagnosis not present

## 2013-04-12 DIAGNOSIS — M25539 Pain in unspecified wrist: Secondary | ICD-10-CM | POA: Diagnosis not present

## 2013-04-18 DIAGNOSIS — M24139 Other articular cartilage disorders, unspecified wrist: Secondary | ICD-10-CM | POA: Diagnosis not present

## 2013-04-18 DIAGNOSIS — M259 Joint disorder, unspecified: Secondary | ICD-10-CM | POA: Diagnosis not present

## 2013-04-18 DIAGNOSIS — G5600 Carpal tunnel syndrome, unspecified upper limb: Secondary | ICD-10-CM | POA: Diagnosis not present

## 2013-07-20 ENCOUNTER — Emergency Department (HOSPITAL_COMMUNITY)
Admission: EM | Admit: 2013-07-20 | Discharge: 2013-07-20 | Disposition: A | Discharge status: Home / Self Care | Payer: Medicare Other | Attending: Emergency Medicine | Admitting: Emergency Medicine

## 2013-07-20 ENCOUNTER — Encounter (HOSPITAL_COMMUNITY): Payer: Self-pay | Admitting: Emergency Medicine

## 2013-07-20 DIAGNOSIS — F17200 Nicotine dependence, unspecified, uncomplicated: Secondary | ICD-10-CM | POA: Insufficient documentation

## 2013-07-20 DIAGNOSIS — Z79899 Other long term (current) drug therapy: Secondary | ICD-10-CM | POA: Diagnosis not present

## 2013-07-20 DIAGNOSIS — Z8639 Personal history of other endocrine, nutritional and metabolic disease: Secondary | ICD-10-CM | POA: Insufficient documentation

## 2013-07-20 DIAGNOSIS — Z880 Allergy status to penicillin: Secondary | ICD-10-CM | POA: Insufficient documentation

## 2013-07-20 DIAGNOSIS — Z862 Personal history of diseases of the blood and blood-forming organs and certain disorders involving the immune mechanism: Secondary | ICD-10-CM | POA: Diagnosis not present

## 2013-07-20 DIAGNOSIS — G44209 Tension-type headache, unspecified, not intractable: Secondary | ICD-10-CM | POA: Insufficient documentation

## 2013-07-20 DIAGNOSIS — IMO0002 Reserved for concepts with insufficient information to code with codable children: Secondary | ICD-10-CM | POA: Insufficient documentation

## 2013-07-20 MED ORDER — DIPHENHYDRAMINE HCL 50 MG/ML IJ SOLN
25.0000 mg | Freq: Once | INTRAMUSCULAR | Status: AC
  Administered 2013-07-20: 25 mg via INTRAVENOUS
  Filled 2013-07-20: qty 1

## 2013-07-20 MED ORDER — METOCLOPRAMIDE HCL 5 MG/ML IJ SOLN
10.0000 mg | Freq: Once | INTRAMUSCULAR | Status: AC
  Administered 2013-07-20: 10 mg via INTRAVENOUS
  Filled 2013-07-20: qty 2

## 2013-07-20 MED ORDER — DEXAMETHASONE SODIUM PHOSPHATE 10 MG/ML IJ SOLN
10.0000 mg | Freq: Once | INTRAMUSCULAR | Status: AC
  Administered 2013-07-20: 10 mg via INTRAVENOUS
  Filled 2013-07-20: qty 1

## 2013-07-20 MED ORDER — KETOROLAC TROMETHAMINE 30 MG/ML IJ SOLN
30.0000 mg | Freq: Once | INTRAMUSCULAR | Status: AC
  Administered 2013-07-20: 30 mg via INTRAVENOUS
  Filled 2013-07-20 (×2): qty 1

## 2013-07-20 MED ORDER — IBUPROFEN 800 MG PO TABS: 800.0000 mg | ORAL_TABLET | Freq: Three times a day (TID) | ORAL | Status: DC

## 2013-07-20 NOTE — ED Provider Notes (Signed)
Medical screening examination/treatment/procedure(s) were performed by non-physician practitioner and as supervising physician I was immediately available for consultation/collaboration.   EKG Interpretation None       Kalman Drape, MD 07/20/13 [phone]

## 2013-07-20 NOTE — Discharge Instructions (Signed)
Please followup with your primary care provider for continued evaluation and treatment of your headache symptoms.    Tension Headache A tension headache is a feeling of pain, pressure, or aching often felt over the front and sides of the head. The pain can be dull or can feel tight (constricting). It is the most common type of headache. Tension headaches are not normally associated with nausea or vomiting and do not get worse with physical activity. Tension headaches can last 30 minutes to several days.  CAUSES  The exact cause is not known, but it may be caused by chemicals and hormones in the brain that lead to pain. Tension headaches often begin after stress, anxiety, or depression. Other triggers may include:  Alcohol.  Caffeine (too much or withdrawal).  Respiratory infections (colds, flu, sinus infections).  Dental problems or teeth clenching.  Fatigue.  Holding your head and neck in one position too long while using a computer. SYMPTOMS   Pressure around the head.   Dull, aching head pain.   Pain felt over the front and sides of the head.   Tenderness in the muscles of the head, neck, and shoulders. DIAGNOSIS  A tension headache is often diagnosed based on:   Symptoms.   Physical examination.   A CT scan or MRI of your head. These tests may be ordered if symptoms are severe or unusual. TREATMENT  Medicines may be given to help relieve symptoms.  HOME CARE INSTRUCTIONS   Only take over-the-counter or prescription medicines for pain or discomfort as directed by your caregiver.   Lie down in a dark, quiet room when you have a headache.   Keep a journal to find out what may be triggering your headaches. For example, write down:  What you eat and drink.  How much sleep you get.  Any change to your diet or medicines.  Try massage or other relaxation techniques.   Ice packs or heat applied to the head and neck can be used. Use these 3 to 4 times per day  for 15 to 20 minutes each time, or as needed.   Limit stress.   Sit up straight, and do not tense your muscles.   Quit smoking if you smoke.  Limit alcohol use.  Decrease the amount of caffeine you drink, or stop drinking caffeine.  Eat and exercise regularly.  Get 7 to 9 hours of sleep, or as recommended by your caregiver.  Avoid excessive use of pain medicine as recurrent headaches can occur.  SEEK MEDICAL CARE IF:   You have problems with the medicines you were prescribed.  Your medicines do not work.  You have a change from the usual headache.  You have nausea or vomiting. SEEK IMMEDIATE MEDICAL CARE IF:   Your headache becomes severe.  You have a fever.  You have a stiff neck.  You have loss of vision.  You have muscular weakness or loss of muscle control.  You lose your balance or have trouble walking.  You feel faint or pass out.  You have severe symptoms that are different from your first symptoms. MAKE SURE YOU:   Understand these instructions.  Will watch your condition.  Will get help right away if you are not doing well or get worse. Document Released: 03/01/2005 Document Revised: 05/24/2011 Document Reviewed: 02/19/2011 [facility] Patient Information 2014 [place], [place].

## 2013-07-20 NOTE — ED Notes (Signed)
Pt states that he began having sharp pain to right side if head behind ear that began yesterday morning around 10am; pt states that it is sharp shooting pains up the side of his head and feels better when he presses on it; pt denies injury but states he was staining a fence when it began; pt denies visual changes; pt denies N/V

## 2013-07-20 NOTE — ED Provider Notes (Signed)
CSN: [id number]     Arrival date & time 07/20/13  0156 History   First MD Initiated Contact with Patient 07/20/13 0203     Chief Complaint  Patient presents with  . Headache   HPI  History provided by the patient. Patient is a 43 year old male with no significant PMH who presents with complaints of right-sided headache. Patient states he first noticed having some pain around the right head behind the ear area earlier in the morning. He states he was outside and when he was bending down to grab something and stood up he felt a pain. Since then and has been fairly persistent with some waxing waning and gradual worsening. He did take a Vicodin around 6 PM which she states helped significantly and he fell asleep. He awoke early this morning with return of the pain. He states if he holds pressure over the right part of his scalp pain improves. He denies any other aggravating or alleviating factors. Denies any associated fever, chills or sweats. No visual changes. No weakness or numbness in extremities. No recent illness. No neck or back pain.    Past Medical History  Diagnosis Date  . Hypokalemia    Past Surgical History  Procedure Laterality Date  . Orif facial fracture    . Hand surgery  2004   No family history on file. History  Substance Use Topics  . Smoking status: Current Every Day Smoker -- 1.00 packs/day    Types: Cigarettes  . Smokeless tobacco: Not on file  . Alcohol Use: No    Review of Systems  Constitutional: Negative for fever, chills and diaphoresis.  Eyes: Negative for visual disturbance.  Neurological: Positive for headaches. Negative for dizziness and light-headedness.  All other systems reviewed and are negative.     Allergies  Penicillins  Home Medications   Prior to Admission medications   Medication Sig Start Date End Date Taking? Authorizing Provider  HYDROcodone-acetaminophen (NORCO/VICODIN) 5-325 MG per tablet Take 1 tablet by mouth every 4 (four)  hours as needed for pain. 09/22/12   Clayton Bibles, PA-C  hydrocortisone cream 1 % Apply topically 2 (two) times daily. 11/01/12   Waldemar Dickens, MD  potassium chloride SA (K-DUR,KLOR-CON) 20 MEQ tablet Take 1 tablet (20 mEq total) by mouth daily. 09/22/12   Clayton Bibles, PA-C  predniSONE (DELTASONE) 50 MG tablet Take 1 tablet (50 mg total) by mouth daily. 11/01/12   Waldemar Dickens, MD  traMADol (ULTRAM) 50 MG tablet Take 1 tablet (50 mg total) by mouth every 6 (six) hours as needed for pain. 10/01/12   Larene Pickett, PA-C   BP 142/91  Pulse 75  Temp(Src) 98.5 F (36.9 C) (Oral)  Resp 18  Ht 5\' 4"  (1.626 m)  Wt 145 lb (65.772 kg)  BMI 24.88 kg/m2  SpO2 98% Physical Exam  Nursing note and vitals reviewed. Constitutional: He is oriented to person, place, and time. He appears well-developed and well-nourished. No distress.  HENT:  Head: Normocephalic and atraumatic.  No sinus pressure or nasal congestion  Eyes: Conjunctivae and EOM are normal. Pupils are equal, round, and reactive to light.  Neck: Normal range of motion. Neck supple.  No meningeal sign  Cardiovascular: Normal rate and regular rhythm.   Pulmonary/Chest: Effort normal and breath sounds normal. No respiratory distress. He has no wheezes.  Neurological: He is alert and oriented to person, place, and time. He has normal strength. No cranial nerve deficit or sensory deficit. Coordination and gait  normal.  Skin: Skin is warm. No rash noted.  Psychiatric: He has a normal mood and affect. His behavior is normal.    ED Course  Procedures   COORDINATION OF CARE:  Nursing notes reviewed. Vital signs reviewed. Initial pt interview and examination performed.   Filed Vitals:   07/20/13 0208  BP: 142/91  Pulse: 75  Temp: 98.5 F (36.9 C)  TempSrc: Oral  Resp: 18  Height: 5\' 4"  (1.626 m)  Weight: 145 lb (65.772 kg)  SpO2: 98%    2:25 AM-patient seen and evaluated. He is well-appearing no acute distress. Does not appear  severely or toxic. Normal nonfocal neuro exam. No meningeal signs.   3:20 AM patient reports feeling significantly better. Continues to be well appearing. At this time stable for discharge home.     MDM   Final diagnoses:  Tension headache       Martie Lee, PA-C 07/20/13 [phone]

## 2013-10-01 DIAGNOSIS — R6884 Jaw pain: Secondary | ICD-10-CM | POA: Diagnosis not present

## 2013-10-16 ENCOUNTER — Emergency Department (HOSPITAL_COMMUNITY)
Admission: EM | Admit: 2013-10-16 | Discharge: 2013-10-16 | Disposition: A | Discharge status: Home / Self Care | Payer: Medicare Other | Attending: Emergency Medicine | Admitting: Emergency Medicine

## 2013-10-16 ENCOUNTER — Encounter (HOSPITAL_COMMUNITY): Payer: Self-pay | Admitting: Emergency Medicine

## 2013-10-16 DIAGNOSIS — Y929 Unspecified place or not applicable: Secondary | ICD-10-CM | POA: Diagnosis not present

## 2013-10-16 DIAGNOSIS — Z862 Personal history of diseases of the blood and blood-forming organs and certain disorders involving the immune mechanism: Secondary | ICD-10-CM | POA: Diagnosis not present

## 2013-10-16 DIAGNOSIS — R21 Rash and other nonspecific skin eruption: Secondary | ICD-10-CM | POA: Diagnosis not present

## 2013-10-16 DIAGNOSIS — T6391XA Toxic effect of contact with unspecified venomous animal, accidental (unintentional), initial encounter: Secondary | ICD-10-CM | POA: Diagnosis not present

## 2013-10-16 DIAGNOSIS — Z880 Allergy status to penicillin: Secondary | ICD-10-CM | POA: Diagnosis not present

## 2013-10-16 DIAGNOSIS — Z8639 Personal history of other endocrine, nutritional and metabolic disease: Secondary | ICD-10-CM | POA: Diagnosis not present

## 2013-10-16 DIAGNOSIS — T63441A Toxic effect of venom of bees, accidental (unintentional), initial encounter: Secondary | ICD-10-CM

## 2013-10-16 DIAGNOSIS — Y9389 Activity, other specified: Secondary | ICD-10-CM | POA: Insufficient documentation

## 2013-10-16 DIAGNOSIS — F17200 Nicotine dependence, unspecified, uncomplicated: Secondary | ICD-10-CM | POA: Insufficient documentation

## 2013-10-16 DIAGNOSIS — T63461A Toxic effect of venom of wasps, accidental (unintentional), initial encounter: Secondary | ICD-10-CM | POA: Diagnosis not present

## 2013-10-16 MED ORDER — KETOROLAC TROMETHAMINE 60 MG/2ML IM SOLN
60.0000 mg | Freq: Once | INTRAMUSCULAR | Status: AC
  Administered 2013-10-16: 60 mg via INTRAMUSCULAR
  Filled 2013-10-16: qty 2

## 2013-10-16 MED ORDER — NAPROXEN 500 MG PO TABS: 500.0000 mg | ORAL_TABLET | Freq: Two times a day (BID) | ORAL | Status: DC

## 2013-10-16 NOTE — ED Notes (Signed)
Pt c/o pain, tenderness and swelling on r/buttock 45 minutes after sitting on a bee. Denies shortness of  breath . Took one 25 mg of benadryl immediatly after sting

## 2013-10-16 NOTE — ED Provider Notes (Signed)
CSN: [id number]     Arrival date & time 10/16/13  1348 History   First MD Initiated Contact with Patient 10/16/13 1434    This chart was scribed for non-physician practitioner, Noland Fordyce, working with No att. providers found by Terressa Koyanagi, ED Scribe. This patient was seen in room WTR8/WTR8 and the patient's care was started at 2:40 PM.  Chief Complaint  Patient presents with  . Insect Bite    bee sting to r/buttock   HPI HPI Comments: [name] is a 43 y.o. male, with Hx of hypokalemia, who presents to the Emergency Department complaining of a bee sting to his right buttocks onset approximately 2 hours ago. Pt also complains of associated pain, tenderness and swelling on said site onset 45 minutes after the bee sting. Pt denies SOB or trouble breathing. Pt denies any Hx of anaphylactic shock to bee stings. Pt reports taking one 25 mg benadryl at home immediately following the bee sting. Pt reports he is allergic to Tramadol, but denies allergies to any other meds.   Pt also complains of consistent rash to his genital area onset several years ago. Pt complains of associated itching in said area, however, denies penile pain or discharge. Pt reports that he has tried numerous topical creams without relief.    Past Medical History  Diagnosis Date  . Hypokalemia    Past Surgical History  Procedure Laterality Date  . Orif facial fracture    . Hand surgery  2004   Family History  Problem Relation Age of Onset  . Hypertension Mother    History  Substance Use Topics  . Smoking status: Current Every Day Smoker -- 1.00 packs/day    Types: Cigarettes  . Smokeless tobacco: Not on file  . Alcohol Use: No    Review of Systems  Skin: Positive for color change.  All other systems reviewed and are negative.  Allergies  Tramadol and Penicillins  Home Medications   Prior to Admission medications   Medication Sig Start Date End Date Taking? Authorizing Provider  acetaminophen  (TYLENOL) 500 MG tablet Take 500 mg by mouth every 6 (six) hours as needed for moderate pain.   Yes Historical Provider, MD  diphenhydrAMINE (SOMINEX) 25 MG tablet Take 25 mg by mouth every 6 (six) hours as needed for allergies.   Yes Historical Provider, MD  naproxen (NAPROSYN) 500 MG tablet Take 1 tablet (500 mg total) by mouth 2 (two) times daily. 10/16/13   Noland Fordyce, PA-C   Triage Vitals: BP 127/75  Pulse 83  Temp(Src) 98.1 F (36.7 C)  Resp 18  SpO2 98% Physical Exam  Nursing note and vitals reviewed. Constitutional: He is oriented to person, place, and time. He appears well-developed and well-nourished.  HENT:  Head: Normocephalic and atraumatic.  Eyes: Conjunctivae and EOM are normal. No scleral icterus.  Neck: Normal range of motion.  Cardiovascular: Normal rate, regular rhythm and normal heart sounds.   Pulmonary/Chest: Effort normal and breath sounds normal. No respiratory distress. He has no wheezes. He has no rales. He exhibits no tenderness.  Genitourinary:  Chaperone present: mild erethematous dry appearing rash on right thigh.   Musculoskeletal: Normal range of motion.  Neurological: He is alert and oriented to person, place, and time.  Skin: Skin is warm and dry.  Mild erythema with tenderness, no obvious foreign body to the right buttocks.   Psychiatric: He has a normal mood and affect. His behavior is normal.    ED Course  Procedures (including critical care time) DIAGNOSTIC STUDIES: Oxygen Saturation is 98% on RA, nl by my interpretation.    COORDINATION OF CARE: 2:44 PM-Discussed treatment plan which includes meds, [facility] referral, dermatology referral, with pt at bedside and pt agreed to plan.   Labs Review Labs Reviewed - No data to display  Imaging Review No results found.   EKG Interpretation None      MDM   Final diagnoses:  Bee sting, accidental or unintentional, initial encounter  Rash of groin    Pt is a 43yo male  presenting to ED with c/o right buttock pain and mild swelling after sitting on a bee.  Denies difficulty breathing or swallowing. Denies hx of anaphylactic reaction to previous bee stings. No evidence of anaphylaxis in ED.  Benadryl taken PTA.  In ED: vitals- WNL.  Lungs: CTAB. Advised pt to continue taking benadryl every 4-6 hours as needed. Cautioned pt it may cause drowsiness and not to drive while taking.  Also encouraged pt to use cool compresses to help with pain. Return precautions provided. Pt verbalized understanding and agreement with tx plan.   I personally performed the services described in this documentation, which was scribed in my presence. The recorded information has been reviewed and is accurate.    Noland Fordyce, PA-C 10/16/13 [phone]

## 2013-10-17 NOTE — ED Provider Notes (Signed)
Medical screening examination/treatment/procedure(s) were performed by non-physician practitioner and as supervising physician I was immediately available for consultation/collaboration.   EKG Interpretation None        Merryl Hacker, MD 10/17/13 1435

## 2014-02-14 ENCOUNTER — Emergency Department (HOSPITAL_COMMUNITY): Payer: Medicare Other

## 2014-02-14 ENCOUNTER — Emergency Department (HOSPITAL_COMMUNITY)
Admission: EM | Admit: 2014-02-14 | Discharge: 2014-02-14 | Disposition: A | Discharge status: Home / Self Care | Payer: Medicare Other | Attending: Emergency Medicine | Admitting: Emergency Medicine

## 2014-02-14 ENCOUNTER — Encounter (HOSPITAL_COMMUNITY): Payer: Self-pay | Admitting: Emergency Medicine

## 2014-02-14 DIAGNOSIS — S1083XA Contusion of other specified part of neck, initial encounter: Secondary | ICD-10-CM | POA: Diagnosis not present

## 2014-02-14 DIAGNOSIS — Y9289 Other specified places as the place of occurrence of the external cause: Secondary | ICD-10-CM | POA: Diagnosis not present

## 2014-02-14 DIAGNOSIS — S1093XA Contusion of unspecified part of neck, initial encounter: Secondary | ICD-10-CM | POA: Diagnosis not present

## 2014-02-14 DIAGNOSIS — R52 Pain, unspecified: Secondary | ICD-10-CM

## 2014-02-14 DIAGNOSIS — Z791 Long term (current) use of non-steroidal anti-inflammatories (NSAID): Secondary | ICD-10-CM | POA: Insufficient documentation

## 2014-02-14 DIAGNOSIS — Z8639 Personal history of other endocrine, nutritional and metabolic disease: Secondary | ICD-10-CM | POA: Insufficient documentation

## 2014-02-14 DIAGNOSIS — Y9389 Activity, other specified: Secondary | ICD-10-CM | POA: Insufficient documentation

## 2014-02-14 DIAGNOSIS — Z720 Tobacco use: Secondary | ICD-10-CM | POA: Insufficient documentation

## 2014-02-14 DIAGNOSIS — M542 Cervicalgia: Secondary | ICD-10-CM | POA: Diagnosis not present

## 2014-02-14 DIAGNOSIS — Z8781 Personal history of (healed) traumatic fracture: Secondary | ICD-10-CM | POA: Insufficient documentation

## 2014-02-14 DIAGNOSIS — S199XXA Unspecified injury of neck, initial encounter: Secondary | ICD-10-CM | POA: Diagnosis present

## 2014-02-14 DIAGNOSIS — Y998 Other external cause status: Secondary | ICD-10-CM | POA: Diagnosis not present

## 2014-02-14 DIAGNOSIS — Z880 Allergy status to penicillin: Secondary | ICD-10-CM | POA: Diagnosis not present

## 2014-02-14 MED ORDER — METHOCARBAMOL 500 MG PO TABS: 500.0000 mg | ORAL_TABLET | Freq: Two times a day (BID) | ORAL | Status: DC

## 2014-02-14 MED ORDER — MELOXICAM 7.5 MG PO TABS: 7.5000 mg | ORAL_TABLET | Freq: Every day | ORAL | Status: DC

## 2014-02-14 NOTE — Discharge Instructions (Signed)
Contusion °A contusion is a deep bruise. Contusions are the result of an injury that caused bleeding under the skin. The contusion may turn blue, purple, or yellow. Minor injuries will give you a painless contusion, but more severe contusions may stay painful and swollen for a few weeks.  °CAUSES  °A contusion is usually caused by a blow, trauma, or direct force to an area of the body. °SYMPTOMS  °· Swelling and redness of the injured area. °· Bruising of the injured area. °· Tenderness and soreness of the injured area. °· Pain. °DIAGNOSIS  °The diagnosis can be made by taking a history and physical exam. An X-ray, CT scan, or MRI may be needed to determine if there were any associated injuries, such as fractures. °TREATMENT  °Specific treatment will depend on what area of the body was injured. In general, the best treatment for a contusion is resting, icing, elevating, and applying cold compresses to the injured area. Over-the-counter medicines may also be recommended for pain control. Ask your caregiver what the best treatment is for your contusion. °HOME CARE INSTRUCTIONS  °· Put ice on the injured area. °¨ Put ice in a plastic bag. °¨ Place a towel between your skin and the bag. °¨ Leave the ice on for 15-20 minutes, 3-4 times a day, or as directed by your health care provider. °· Only take over-the-counter or prescription medicines for pain, discomfort, or fever as directed by your caregiver. Your caregiver may recommend avoiding anti-inflammatory medicines (aspirin, ibuprofen, and naproxen) for 48 hours because these medicines may increase bruising. °· Rest the injured area. °· If possible, elevate the injured area to reduce swelling. °SEEK IMMEDIATE MEDICAL CARE IF:  °· You have increased bruising or swelling. °· You have pain that is getting worse. °· Your swelling or pain is not relieved with medicines. °MAKE SURE YOU:  °· Understand these instructions. °· Will watch your condition. °· Will get help right  away if you are not doing well or get worse. °Document Released: 12/09/2004 Document Revised: 03/06/2013 Document Reviewed: 01/04/2011 °[employer]® Patient Information ©2015 [employer]. This information is not intended to replace advice given to you by your health care provider. Make sure you discuss any questions you have with your health care provider. ° °
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
## 2014-02-17 ENCOUNTER — Encounter (HOSPITAL_COMMUNITY): Payer: Self-pay | Admitting: Emergency Medicine

## 2014-02-17 ENCOUNTER — Emergency Department (HOSPITAL_COMMUNITY)
Admission: EM | Admit: 2014-02-17 | Discharge: 2014-02-17 | Disposition: A | Discharge status: Home / Self Care | Payer: Medicare Other | Attending: Emergency Medicine | Admitting: Emergency Medicine

## 2014-02-17 DIAGNOSIS — R51 Headache: Secondary | ICD-10-CM | POA: Diagnosis present

## 2014-02-17 DIAGNOSIS — Z8639 Personal history of other endocrine, nutritional and metabolic disease: Secondary | ICD-10-CM | POA: Diagnosis not present

## 2014-02-17 DIAGNOSIS — M542 Cervicalgia: Secondary | ICD-10-CM | POA: Diagnosis not present

## 2014-02-17 DIAGNOSIS — Z791 Long term (current) use of non-steroidal anti-inflammatories (NSAID): Secondary | ICD-10-CM | POA: Insufficient documentation

## 2014-02-17 DIAGNOSIS — Z720 Tobacco use: Secondary | ICD-10-CM | POA: Insufficient documentation

## 2014-02-17 DIAGNOSIS — J0100 Acute maxillary sinusitis, unspecified: Secondary | ICD-10-CM | POA: Diagnosis not present

## 2014-02-17 DIAGNOSIS — Z79899 Other long term (current) drug therapy: Secondary | ICD-10-CM | POA: Diagnosis not present

## 2014-02-17 DIAGNOSIS — Z880 Allergy status to penicillin: Secondary | ICD-10-CM | POA: Diagnosis not present

## 2014-02-17 MED ORDER — IBUPROFEN 800 MG PO TABS: 800.0000 mg | ORAL_TABLET | Freq: Three times a day (TID) | ORAL | Status: DC

## 2014-02-17 MED ORDER — TRIAMCINOLONE ACETONIDE 55 MCG/ACT NA AERO: 2.0000 | INHALATION_SPRAY | Freq: Every day | NASAL | Status: DC
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
## 2014-02-17 NOTE — Discharge Instructions (Signed)

## 2014-04-07 ENCOUNTER — Emergency Department (HOSPITAL_COMMUNITY): Payer: Medicare Other

## 2014-04-07 ENCOUNTER — Encounter (HOSPITAL_COMMUNITY): Payer: Self-pay | Admitting: Emergency Medicine

## 2014-04-07 ENCOUNTER — Emergency Department (HOSPITAL_COMMUNITY)
Admission: EM | Admit: 2014-04-07 | Discharge: 2014-04-07 | Disposition: A | Discharge status: Home / Self Care | Payer: Medicare Other | Attending: Emergency Medicine | Admitting: Emergency Medicine

## 2014-04-07 DIAGNOSIS — R21 Rash and other nonspecific skin eruption: Secondary | ICD-10-CM | POA: Insufficient documentation

## 2014-04-07 DIAGNOSIS — N433 Hydrocele, unspecified: Secondary | ICD-10-CM | POA: Diagnosis not present

## 2014-04-07 DIAGNOSIS — N50819 Testicular pain, unspecified: Secondary | ICD-10-CM

## 2014-04-07 DIAGNOSIS — M79642 Pain in left hand: Secondary | ICD-10-CM | POA: Diagnosis not present

## 2014-04-07 DIAGNOSIS — Z720 Tobacco use: Secondary | ICD-10-CM | POA: Diagnosis not present

## 2014-04-07 DIAGNOSIS — G5602 Carpal tunnel syndrome, left upper limb: Secondary | ICD-10-CM | POA: Insufficient documentation

## 2014-04-07 DIAGNOSIS — N503 Cyst of epididymis: Secondary | ICD-10-CM | POA: Diagnosis not present

## 2014-04-07 DIAGNOSIS — Z8639 Personal history of other endocrine, nutritional and metabolic disease: Secondary | ICD-10-CM | POA: Insufficient documentation

## 2014-04-07 DIAGNOSIS — Z791 Long term (current) use of non-steroidal anti-inflammatories (NSAID): Secondary | ICD-10-CM | POA: Insufficient documentation

## 2014-04-07 DIAGNOSIS — Z7952 Long term (current) use of systemic steroids: Secondary | ICD-10-CM | POA: Insufficient documentation

## 2014-04-07 DIAGNOSIS — N508 Other specified disorders of male genital organs: Secondary | ICD-10-CM | POA: Diagnosis not present

## 2014-04-07 DIAGNOSIS — Z880 Allergy status to penicillin: Secondary | ICD-10-CM | POA: Diagnosis not present

## 2014-04-07 DIAGNOSIS — M25532 Pain in left wrist: Secondary | ICD-10-CM | POA: Diagnosis present

## 2014-04-07 DIAGNOSIS — Z79899 Other long term (current) drug therapy: Secondary | ICD-10-CM | POA: Insufficient documentation

## 2014-04-07 LAB — URINALYSIS, ROUTINE W REFLEX MICROSCOPIC
BILIRUBIN URINE: NEGATIVE
GLUCOSE, UA: NEGATIVE mg/dL
Hgb urine dipstick: NEGATIVE
KETONES UR: NEGATIVE mg/dL
LEUKOCYTES UA: NEGATIVE
NITRITE: NEGATIVE
Protein, ur: NEGATIVE mg/dL
Specific Gravity, Urine: 1.024 (ref 1.005–1.030)
UROBILINOGEN UA: 1 mg/dL (ref 0.0–1.0)
pH: 8 (ref 5.0–8.0)

## 2014-04-07 NOTE — ED Provider Notes (Signed)
CSN: [id number]     Arrival date & time 04/07/14  1048 History   First MD Initiated Contact with Patient 04/07/14 1131     Chief Complaint  Patient presents with  . groin itching    . Hand Pain     (Consider location/radiation/quality/duration/timing/severity/associated sxs/prior Treatment) Patient is a 44 y.o. male presenting with hand pain. The history is provided by the patient. No language interpreter was used.  Hand Pain Associated symptoms include arthralgias (left wrist pain ), joint swelling (left wrist) and a rash. Pertinent negatives include no abdominal pain, fever, nausea, neck pain, numbness, vomiting or weakness.  [name] is a 44 y/o M with PMHx of hypokalemia presenting to the ED with left wrist pain and rash to the left groin. Patient reported that he had surgery in his left wrist in 1998, unable to recall what for - stated that he has had issues in his left wrist for the past couple of years with worsening starting Monday. Patient reported that he has noticed swelling in his left wrist and stated that there is a sticking sensation to his left wrist that radiates into his left hand - mainly to ring and small finger. Stated that there is pain in the ring and small finger when he moves them. Patient reported that he has been having a rash to the left groin and left testicle for the past 2 years - stated that he has been seen and assessed at [facility] where he has tried all different types of creams and lotions without relief - patient reported that [employer] appears to be working the best - stated that he has not used some in a long time. Reported that he has had discomfort in the left testicle especially after scratching. Denied fall, heavy lifting, injury, fever, numbness, tingling, loss of sensation, weakness, abdominal pain, nausea, vomiting, back pain, neck pain, neck stiffness, red streaks running down the leg, travel, new fabric softener/lotion/soaps. PCP none  Past  Medical History  Diagnosis Date  . Hypokalemia    Past Surgical History  Procedure Laterality Date  . Orif facial fracture    . Hand surgery  2004   Family History  Problem Relation Age of Onset  . Hypertension Mother    History  Substance Use Topics  . Smoking status: Current Every Day Smoker -- 1.00 packs/day    Types: Cigarettes  . Smokeless tobacco: Not on file  . Alcohol Use: No    Review of Systems  Constitutional: Negative for fever.  Gastrointestinal: Negative for nausea, vomiting and abdominal pain.  Genitourinary: Positive for dysuria and testicular pain (left ). Negative for urgency and decreased urine volume.  Musculoskeletal: Positive for joint swelling (left wrist) and arthralgias (left wrist pain ). Negative for back pain, neck pain and neck stiffness.  Skin: Positive for rash.  Neurological: Negative for weakness and numbness.      Allergies  Tramadol and Penicillins  Home Medications   Prior to Admission medications   Medication Sig Start Date End Date Taking? Authorizing Provider  meloxicam (MOBIC) 7.5 MG tablet Take 1 tablet (7.5 mg total) by mouth daily. 02/14/14  Yes Hollace Kinnier Sofia, PA-C  methocarbamol (ROBAXIN) 500 MG tablet Take 1 tablet (500 mg total) by mouth 2 (two) times daily. 02/14/14  Yes Hollace Kinnier Sofia, PA-C  acetaminophen (TYLENOL) 500 MG tablet Take 500 mg by mouth every 6 (six) hours as needed for moderate pain.    Historical Provider, MD  diphenhydrAMINE [facility])  25 MG tablet Take 25 mg by mouth every 6 (six) hours as needed for allergies.    Historical Provider, MD  ibuprofen (ADVIL,MOTRIN) 800 MG tablet Take 1 tablet (800 mg total) by mouth 3 (three) times daily. Patient not taking: Reported on 04/07/2014 02/17/14   Nehemiah Settle A Upstill, PA-C  triamcinolone (NASACORT AQ) 55 MCG/ACT AERO nasal inhaler Place 2 sprays into the nose daily. Patient not taking: Reported on 04/07/2014 02/17/14   Nehemiah Settle A Upstill, PA-C   BP 154/85 mmHg  Pulse 69   Temp(Src) 98.3 F (36.8 C)  Resp 16  SpO2 100% Physical Exam  Constitutional: He is oriented to person, place, and time. He appears well-developed and well-nourished. No distress.  HENT:  Head: Normocephalic and atraumatic.  Eyes: Conjunctivae and EOM are normal. Pupils are equal, round, and reactive to light. Right eye exhibits no discharge. Left eye exhibits no discharge.  Neck: Normal range of motion. Neck supple. No tracheal deviation present.  Cardiovascular: Normal rate, regular rhythm and normal heart sounds.  Exam reveals no friction rub.   No murmur heard. Pulses:      Radial pulses are 2+ on the right side, and 2+ on the left side.  Cap refill < 3 seconds  Pulmonary/Chest: Effort normal and breath sounds normal. No respiratory distress. He has no wheezes. He has no rales.  Genitourinary:  Negative swelling, erythema, inflammation, bulging, varicocele, hydrocele identified to the testicles bilaterally. Rash of a papular presentation, normal skin color without drainage or bleeding noted identified to the left testicle. Mild discomfort upon palpation to the left testicle. Penis unremarkable-negative lesions or sores identified. Negative inguinal lymphadenopathy. Exam chaperoned with nurse, Floria Raveling  Musculoskeletal: He exhibits tenderness. He exhibits no edema.  Negative swelling, erythema, inflammation, lesions, sores, deformities, malalignments, ecchymosis identified to the left wrist. Healed over scars from previous surgery 1998 identified. Full flexion and extension identified, radial and ulnar inversion noted. Full range of motion to the digits of the left hand without difficulty. Patient is able to produce a fist.   Full range of motion to remaining left upper extremity, right upper extremity and lower extremities bilaterally without difficulty or ataxia.  Lymphadenopathy:    He has no cervical adenopathy.  Neurological: He is alert and oriented to person, place, and  time. No cranial nerve deficit. He exhibits normal muscle tone. Coordination normal.  Cranial nerves III-XII grossly intact Strength 5+/5+ to upper extremities bilaterally with resistance applied, equal distribution noted Strength intact to MCP, PIP, DIP joints of left hand - minimally decreased to the left ring and small finger secondary to pain Equal grip strength Sensation intact with differentiation to sharp and dull touch  Skin: Skin is warm and dry. Rash noted. He is not diaphoretic. No erythema.  Papular rashes skin color identified to the left groin without drainage or bleeding noted. Negative red streaks. Negative lymphadenopathy.  Psychiatric: He has a normal mood and affect. His behavior is normal. Thought content normal.  Nursing note and vitals reviewed.   ED Course  Procedures (including critical care time) Labs Review Labs Reviewed  URINALYSIS, ROUTINE W REFLEX MICROSCOPIC - Abnormal; Notable for the following:    APPearance CLOUDY (*)    All other components within normal limits    Imaging Review Dg Wrist Complete Left  04/07/2014   CLINICAL DATA:  Chronic left wrist pain for 1 week. No known injury.  EXAM: LEFT WRIST - COMPLETE 3+ VIEW  COMPARISON:  07/24/2007  FINDINGS: There is no  evidence of fracture or dislocation. There is no evidence of arthropathy or other focal bone abnormality. Soft tissues are unremarkable.  IMPRESSION: Negative.   Electronically Signed   By: Kerby Moors M.D.   On: 04/07/2014 14:23   US Scrotum  04/07/2014   CLINICAL DATA:  Left testicular pain for 2 years  EXAM: ULTRASOUND OF SCROTUM  TECHNIQUE: Complete ultrasound examination of the testicles, epididymis, and other scrotal structures was performed.  COMPARISON:  None.  FINDINGS: Right testicle  Measurements: 4.2 x 2.8 x 3.4 cm. No mass or microlithiasis visualized.  Left testicle  Measurements: 4.1 x 2.1 x 3.7 cm. No mass or microlithiasis visualized.  Right epididymis:  A tiny epididymal  cyst is noted measuring 2 mm.  Left epididymis:  Normal in size and appearance.  Hydrocele:  Small bilateral hydroceles are seen.  Varicocele:  None visualized.  IMPRESSION: No evidence of testicular torsion.  Small right epididymal cyst.  Small bilateral hydroceles.   Electronically Signed   By: Inez Catalina M.D.   On: 04/07/2014 13:49   [place] Art/ven Flow Abd Pelv Doppler  04/07/2014   CLINICAL DATA:  Left testicular pain for 2 years  EXAM: ULTRASOUND OF SCROTUM  TECHNIQUE: Complete ultrasound examination of the testicles, epididymis, and other scrotal structures was performed.  COMPARISON:  None.  FINDINGS: Right testicle  Measurements: 4.2 x 2.8 x 3.4 cm. No mass or microlithiasis visualized.  Left testicle  Measurements: 4.1 x 2.1 x 3.7 cm. No mass or microlithiasis visualized.  Right epididymis:  A tiny epididymal cyst is noted measuring 2 mm.  Left epididymis:  Normal in size and appearance.  Hydrocele:  Small bilateral hydroceles are seen.  Varicocele:  None visualized.  IMPRESSION: No evidence of testicular torsion.  Small right epididymal cyst.  Small bilateral hydroceles.   Electronically Signed   By: Inez Catalina M.D.   On: 04/07/2014 13:49     EKG Interpretation None      MDM   Final diagnoses:  Testicle pain  Left hand pain  Carpal tunnel syndrome of left wrist  Rash and nonspecific skin eruption    Medications - No data to display  Filed Vitals:   04/07/14 1106  BP: 154/85  Pulse: 69  Temp: 98.3 F (36.8 C)  Resp: 16  SpO2: 100%   Urinalysis unremarkable-negative finding of hemoglobin, nitrites, leukocytes-negative findings of infection. Ultrasound of scrotum no evidence of testicular torsion-small right epididymal cyst and small bilateral hydroceles. Plain film of left wrist negative for acute osseous injury. Patient presenting to emergency department with left wrist pain. Patient has history of left wrist issues, surgery performed in 1998. Reported that the pain started  on Monday. Left wrist unremarkable-full range of motion identified to the left wrist and digits of the left hand without difficulty. Pulses palpable and strong. Cap refill less than 3 seconds. Negative focal neurological deficits identified. Mild decreased range of motion identified to the left ring finger and small finger secondary to pain. Doubt septic joint. Doubt compartment syndrome. Negative signs of necrosis. Negative signs of ischemia. Suspicion to be carpal tunnel syndrome. Patient placed in wrist brace. Patient presenting to the with rash for the past 2 years localized to left groin. Doubt erythema multiforme major and minor. Doubt SJS. Doubt shingles. Suspicion to be possible contact dermatitis - definitive etiology unknown. Recommended patient to continue to use [employer] since this is helping the rash. Patient stable, afebrile. Patient not septic appearing. Discharged patient. [facility], dermatologist. Discussed with patient to rest and stay hydrated. Discussed with patient to rest, ice, elevate the left hand. Discussed with patient to closely monitor symptoms and if symptoms are to worsen or change to report back to the ED - strict return instructions given.  Patient agreed to plan of care, understood, all questions answered.    Jamse Mead, PA-C 04/07/14 [place], MD 04/07/14 [id number]

## 2014-04-07 NOTE — Discharge Instructions (Signed)
Please call your doctor for a followup appointment within 24-48 hours. When you talk to your doctor please let them know that you were seen in the emergency department and have them acquire all of your records so that they can discuss the findings with you and formulate a treatment plan to fully care for your new and ongoing problems. Please call and follow-up with hand specialist regarding hand pain that has been ongoing for many years Please follow up with [facility] [place] Please follow up with dermatologist regarding rash that has been ongoing for the past 2 years Please continue to use goldbond for rash if this is helping Please avoid any physical or strenuous activity  Please avoid heavy lifting Please keep area of rash dry Please continue to monitor symptoms closely and if symptoms are to worsen or change (fever greater than 101, chills, sweating, nausea, vomiting, chest pain, shortness of breathe, difficulty breathing, weakness, numbness, tingling, worsening or changes to pain pattern, swelling, fall, injury, loss of sensation, red streaks, drainage) please report back to the Emergency Department immediately.   Carpal Tunnel Syndrome The carpal tunnel is a narrow area located on the palm side of your wrist. The tunnel is formed by the wrist bones and ligaments. Nerves, blood vessels, and tendons pass through the carpal tunnel. Repeated wrist motion or certain diseases may cause swelling within the tunnel. This swelling pinches the main nerve in the wrist (median nerve) and causes the painful hand and arm condition called carpal tunnel syndrome. CAUSES   Repeated wrist motions.  Wrist injuries.  Certain diseases like arthritis, diabetes, alcoholism, hyperthyroidism, and kidney failure.  Obesity.  Pregnancy. SYMPTOMS   A "pins and needles" feeling in your fingers or hand, especially in your thumb, index and middle fingers.  Tingling or numbness in your fingers or hand.  An  aching feeling in your entire arm, especially when your wrist and elbow are bent for long periods of time.  Wrist pain that goes up your arm to your shoulder.  Pain that goes down into your palm or fingers.  A weak feeling in your hands. DIAGNOSIS  Your health care provider will take your history and perform a physical exam. An electromyography test may be needed. This test measures electrical signals sent out by your nerves into the muscles. The electrical signals are usually slowed by carpal tunnel syndrome. You may also need X-rays. TREATMENT  Carpal tunnel syndrome may clear up by itself. Your health care provider may recommend a wrist splint or medicine such as a nonsteroidal anti-inflammatory medicine. Cortisone injections may help. Sometimes, surgery may be needed to free the pinched nerve.  HOME CARE INSTRUCTIONS   Take all medicine as directed by your health care provider. Only take over-the-counter or prescription medicines for pain, discomfort, or fever as directed by your health care provider.  If you were given a splint to keep your wrist from bending, wear it as directed. It is important to wear the splint at night. Wear the splint for as long as you have pain or numbness in your hand, arm, or wrist. This may take 1 to 2 months.  Rest your wrist from any activity that may be causing your pain. If your symptoms are work-related, you may need to talk to your employer about changing to a job that does not require using your wrist.  Put ice on your wrist after long periods of wrist activity.  Put ice in a plastic bag.  Place a towel  between your skin and the bag.  Leave the ice on for 15-20 minutes, 03-04 times a day.  Keep all follow-up visits as directed by your health care provider. This includes any orthopedic referrals, physical therapy, and rehabilitation. Any delay in getting necessary care could result in a delay or failure of your condition to heal. SEEK IMMEDIATE  MEDICAL CARE IF:   You have new, unexplained symptoms.  Your symptoms get worse and are not helped or controlled with medicines. MAKE SURE YOU:   Understand these instructions.  Will watch your condition.  Will get help right away if you are not doing well or get worse. Document Released: 02/27/2000 Document Revised: 07/16/2013 Document Reviewed: 01/15/2011 [facility] Patient Information 2015 [place], [place]. This information is not intended to replace advice given to you by your health care provider. Make sure you discuss any questions you have with your health care provider.  Rash A rash is a change in the color or texture of your skin. There are many different types of rashes. You may have other problems that accompany your rash. CAUSES   Infections.  Allergic reactions. This can include allergies to pets or foods.  Certain medicines.  Exposure to certain chemicals, soaps, or cosmetics.  Heat.  Exposure to poisonous plants.  Tumors, both cancerous and noncancerous. SYMPTOMS   Redness.  Scaly skin.  Itchy skin.  Dry or cracked skin.  Bumps.  Blisters.  Pain. DIAGNOSIS  Your caregiver may do a physical exam to determine what type of rash you have. A skin sample (biopsy) may be taken and examined under a microscope. TREATMENT  Treatment depends on the type of rash you have. Your caregiver may prescribe certain medicines. For serious conditions, you may need to see a skin doctor (dermatologist). HOME CARE INSTRUCTIONS   Avoid the substance that caused your rash.  Do not scratch your rash. This can cause infection.  You may take cool baths to help stop itching.  Only take over-the-counter or prescription medicines as directed by your caregiver.  Keep all follow-up appointments as directed by your caregiver. SEEK IMMEDIATE MEDICAL CARE IF:  You have increasing pain, swelling, or redness.  You have a fever.  You have new or severe symptoms.  You have  body aches, diarrhea, or vomiting.  Your rash is not better after 3 days. MAKE SURE YOU:  Understand these instructions.  Will watch your condition.  Will get help right away if you are not doing well or get worse. Document Released: 02/19/2002 Document Revised: 05/24/2011 Document Reviewed: 12/14/2010 [facility] Patient Information 2015 [place], [place]. This information is not intended to replace advice given to you by your health care provider. Make sure you discuss any questions you have with your health care provider.    Emergency Department Resource Guide 1) Find a Doctor and Pay Out of Pocket Although you won't have to find out who is covered by your insurance plan, it is a good idea to ask around and get recommendations. You will then need to call the office and see if the doctor you have chosen will accept you as a new patient and what types of options they offer for patients who are self-pay. Some doctors offer discounts or will set up payment plans for their patients who do not have insurance, but you will need to ask so you aren't surprised when you get to your appointment.  2) Contact Your Local Health Department Not all health departments have doctors that can see patients for  sick visits, but many do, so it is worth a call to see if yours does. If you don't know where your local health department is, you can check in your phone book. The CDC also has a tool to help you locate your state's health department, and many state websites also have listings of all of their local health departments.  3) Find a [facility] If your illness is not likely to be very severe or complicated, you may want to try a walk in clinic. These are popping up all over the country in pharmacies, drugstores, and shopping centers. They're usually staffed by nurse practitioners or physician assistants that have been trained to treat common illnesses and complaints. They're usually fairly quick and  inexpensive. However, if you have serious medical issues or chronic medical problems, these are probably not your best option.  No Primary Care Doctor: - Call Health Connect at  [phone] - they can help you locate a primary care doctor that  accepts your insurance, provides certain services, etc. - Physician Referral Service- [phone]  Chronic Pain Problems: Organization         Address  Phone   Notes  [facility]  [phone] Patients need to be referred by their primary care doctor.   Medication Assistance: Organization         Address  Phone   Notes  [facility] Medication [facility] [address] [place], [place] [address] [phone] --Must be a resident of [facility] -- Must have NO insurance coverage whatsoever (no Medicaid/ Medicare, etc.) -- The pt. MUST have a primary care doctor that directs their care regularly and follows them in the community   MedAssist  [phone]   [employer]  [phone]    Agencies that provide inexpensive medical care: Organization         Address  Phone   Notes  [place]  [phone]   [facility]    [phone]   [facility] [place], [place] [address] [phone]   [place] [address], [place] [phone]   Planned Parenthood    [phone]   [facility]    [phone]   [place] and [address], [place] Phone:  [phone], Fax:  [phone] Hours of Operation:  9 am - 6 pm, M-F.  Also accepts Medicaid/Medicare and self-pay.  [facility] for [place], [address], [place] Phone: [phone], Fax: [phone]. Hours of Operation:  8:30 am - 5:30 pm, M-F.  Also accepts Medicaid and self-pay.  [facility] [place] [address] [place] Phone: [phone]   [place], [place], [place] [phone], Ext. [phone] Mondays & Thursdays: 7-9 AM.  First 15 patients are seen on a first come, first serve basis.    [place] Providers:  Organization         Address  Phone   Notes  [facility] [address], [place] [phone] Also accepts self-pay patients.  [place], [place]  [phone]   [place], [address], [place] [phone]   [facility] [address], [place] [phone]  Lucianne Lei [address], [place]   [phone] Only accepts [place] patients after they have their name applied to their card.   Self-Pay (no insurance) in [facility]:  Organization         Address  Phone   Notes  Sickle Cell Patients, [facility] [place] [phone]   [facility] [place] [phone]   [facility] [place]  [place], [place], [place] [phone]   [facility]/Dr. Osei-Bonsu  [address] [place] or [address], [place] [phone] Phone number for both [place] and [place] locations is the same.  [facility] [address], [place] [phone]   [facility] [address], [place] or [address] [phone]   [facility] [address], [place] [phone], phone; [phone], fax Sees patients 1st and 3rd Saturday of every month.  Must not qualify for public or private insurance (i.e. Medicaid, Medicare, [place] Health Choice, Veterans' Benefits)  Household income should be no more than 200% of the poverty level The clinic cannot treat you if you are pregnant or  think you are pregnant  Sexually transmitted diseases are not treated at the clinic.    Dental Care: Organization         Address  Phone  Notes  [facility] Department of [facility] [place] [phone] Accepts children up to age 74 who are enrolled in [place] or [place]; pregnant women with a Medicaid card; and children who have applied for Medicaid or [place] Health Choice, but were declined, whose parents can pay a reduced fee at time of service.  [facility] Department of [facility]  [address], [place] [phone] Accepts children up to age 44 who are enrolled in [place] or [place]; pregnant women with a Medicaid card; and children who have applied for Medicaid or [place] Health Choice, but were declined, whose parents can pay a reduced fee at time of service.  [place] Adult Dental Access PROGRAM  [place] [phone] Patients are seen by appointment only. Walk-ins are not accepted. [place] will see patients 56 years of age and older. Monday - Tuesday (8am-5pm) Most Wednesdays (8:30-5pm) $30 per visit, cash only  [facility] Adult Dental Access PROGRAM  [address], [facility] [phone] Patients are seen by appointment only. Walk-ins are not accepted. [place] will see patients 3 years of age and older. One Wednesday Evening (Monthly: Volunteer Based).  $30 per visit, cash only  [place]  [phone] for adults; Children under age 66, call Graduate Pediatric Dentistry at [phone]. Children aged 58-14, please call [phone] to request a pediatric application.  Dental services are provided in all areas of dental care including fillings, crowns and bridges, complete and partial dentures, implants, gum treatment, root canals, and extractions. Preventive care is also provided. Treatment is provided to both adults  and children. Patients are selected via a lottery and there is often a waiting list.   [facility] [address], [place]  [phone] [email or web address], [place], [place] [phone], Ext.  123 Second and Fourth Thursday of each month, opens at 6:30 AM; Clinic ends at 9 AM.  Patients are seen on a first-come first-served basis, and a limited number are seen during each clinic.   [facility]  [address] [place] Danker [place], [place] [phone]   Eligibility Requirements You must have lived in [place], [place], or [place] counties for at least the last three months.   You cannot be eligible for state or federal sponsored [employer], including [employer], [place], or [employer].   You generally cannot be eligible for healthcare insurance through your employer.    How to apply: Eligibility screenings are held every Tuesday and Wednesday afternoon from 1:00 pm until 4:00 pm. You do not need an appointment for the interview!  [facility] [address] [place], Center   [place]  [place] Department  [place]  [phone]    Behavioral Health Resources in the Community: Intensive Outpatient Programs Organization         Address  Phone  Notes  [place]. [address], [place], [place] [phone]   [facility] Outpatient [address] [place], [place]   ADS: Alcohol [facility] Drug [facility] [address] [place], [place]   [place] [address],  [place], [place] or [phone]   Substance Abuse Resources Organization         Address  Phone  Notes  Alcohol and Drug Services  [phone]   [place]  [phone]   [facility] [place]     Chinita Pester  [phone]   Residential & Outpatient Substance Abuse Program  [phone]   Psychological Services Organization         Address  Phone  Notes  [facility]  [place]  [phone]   [place] [address] [place] or [phone]    Mobile Crisis Teams Organization         Address  Phone  Notes  [facility], Mobile Crisis Care Unit  [phone]   [facility] Psychotherapeutic Services  [address] [place], [place]   [place] Levels [address], [place] [phone]    Self-Help/Support Groups Organization         Address  Phone             Notes  [place]. of [place] - variety of support groups  [place] Call for more information  Narcotics Anonymous (NA), Caring Services [address], [employer] [place]  2 meetings at this location   Special educational needs teacher         Address  Phone  Notes  [facility] [place],    [place]  [phone]   [facility]  [address], [place] [address], [place], [place]   [place], [place] [phone] Admissions: 8am-3pm M-F  Incentives Substance [place] [address]    [place], [place] [phone]   [facility] [address] Jadene Pierini [place], [place]   The [place].,  [place], [place] - Intensive Outpatient [address], [place], [place]   [facility] [address],  [place], [place] [phone] or [phone]   [facility] Services (RTS) [address], [place], [place] Accepts Medicaid  [facility] [place] [address],  [place] [phone] Substance Abuse/Addiction Treatment   [facility] Organization         Address  Phone  Notes  [facility]  [phone]   Domenic Schwab, PhD [address] Arlis [place], [place]   [phone] or [phone]   [place], [place] [phone]   [place] [address], [place], [place] [phone] Insurance/Medicaid/sponsorship through [facility] and Families [address], [address] [place]                                    [place], [place] [phone] [place] [address], [place] [phone]    Dr. Adele Schilder  [phone]   [facility] Dept. 1) [address], [place] 2) [place] 3)  [place] [address], [place] [phone]  [phone]   [place] [phone] or [phone] (After Hours)

## 2014-04-07 NOTE — ED Notes (Signed)
Pt from home c/o chronic right hand pain and groin itching that has been ongoing x 2 years

## 2014-05-14 DIAGNOSIS — S63522A Sprain of radiocarpal joint of left wrist, initial encounter: Secondary | ICD-10-CM | POA: Diagnosis not present

## 2014-05-14 DIAGNOSIS — R208 Other disturbances of skin sensation: Secondary | ICD-10-CM | POA: Diagnosis not present

## 2014-05-17 DIAGNOSIS — S63522D Sprain of radiocarpal joint of left wrist, subsequent encounter: Secondary | ICD-10-CM | POA: Diagnosis not present

## 2014-05-17 DIAGNOSIS — R208 Other disturbances of skin sensation: Secondary | ICD-10-CM | POA: Diagnosis not present

## 2014-05-31 DIAGNOSIS — R208 Other disturbances of skin sensation: Secondary | ICD-10-CM | POA: Diagnosis not present

## 2014-05-31 DIAGNOSIS — S63522D Sprain of radiocarpal joint of left wrist, subsequent encounter: Secondary | ICD-10-CM | POA: Diagnosis not present

## 2014-05-31 DIAGNOSIS — G5602 Carpal tunnel syndrome, left upper limb: Secondary | ICD-10-CM | POA: Diagnosis not present

## 2014-06-16 ENCOUNTER — Emergency Department (HOSPITAL_COMMUNITY)
Admission: EM | Admit: 2014-06-16 | Discharge: 2014-06-16 | Disposition: A | Discharge status: Home / Self Care | Payer: Medicare Other | Attending: Emergency Medicine | Admitting: Emergency Medicine

## 2014-06-16 ENCOUNTER — Encounter (HOSPITAL_COMMUNITY): Payer: Self-pay | Admitting: Emergency Medicine

## 2014-06-16 DIAGNOSIS — R059 Cough, unspecified: Secondary | ICD-10-CM

## 2014-06-16 DIAGNOSIS — F17210 Nicotine dependence, cigarettes, uncomplicated: Secondary | ICD-10-CM | POA: Diagnosis not present

## 2014-06-16 DIAGNOSIS — Z7952 Long term (current) use of systemic steroids: Secondary | ICD-10-CM | POA: Diagnosis not present

## 2014-06-16 DIAGNOSIS — Z880 Allergy status to penicillin: Secondary | ICD-10-CM | POA: Diagnosis not present

## 2014-06-16 DIAGNOSIS — Z791 Long term (current) use of non-steroidal anti-inflammatories (NSAID): Secondary | ICD-10-CM | POA: Diagnosis not present

## 2014-06-16 DIAGNOSIS — J069 Acute upper respiratory infection, unspecified: Secondary | ICD-10-CM | POA: Diagnosis not present

## 2014-06-16 DIAGNOSIS — Z8639 Personal history of other endocrine, nutritional and metabolic disease: Secondary | ICD-10-CM | POA: Insufficient documentation

## 2014-06-16 DIAGNOSIS — R05 Cough: Secondary | ICD-10-CM | POA: Diagnosis present

## 2014-06-16 DIAGNOSIS — Z79899 Other long term (current) drug therapy: Secondary | ICD-10-CM | POA: Insufficient documentation

## 2014-06-16 DIAGNOSIS — Z720 Tobacco use: Secondary | ICD-10-CM | POA: Insufficient documentation

## 2014-06-16 MED ORDER — GUAIFENESIN 100 MG/5ML PO LIQD: 100.0000 mg | ORAL | Status: DC | PRN

## 2014-06-16 NOTE — ED Provider Notes (Signed)
CSN: [id number]     Arrival date & time 06/16/14  1000 History   First MD Initiated Contact with Patient 06/16/14 1033     Chief Complaint  Patient presents with  . Cough    x 24 hours  . Headache    x 24 hours, took one Tylenol, x 2  . Chills     (Consider location/radiation/quality/duration/timing/severity/associated sxs/prior Treatment) HPI [name] is a 44 y.o. male who comes in for evaluation of cough and headache for the past 24 hours. Characterizes his cough is intermittent and nonproductive. He reports he only has a headache when he "coughs a lot". Patient reports he has tried Tylenol with some relief of his symptoms. He denies any fevers, sore throat, shortness of breath, chest pain, changes in vision, nausea or vomiting, abdominal pain, numbness or weakness, myalgias, rash. Rates his current discomfort as a 7/10. No other aggravating or modifying factors.  Past Medical History  Diagnosis Date  . Hypokalemia    Past Surgical History  Procedure Laterality Date  . Orif facial fracture    . Hand surgery  2004   Family History  Problem Relation Age of Onset  . Hypertension Mother    History  Substance Use Topics  . Smoking status: Current Every Day Smoker -- 1.00 packs/day    Types: Cigarettes  . Smokeless tobacco: Not on file  . Alcohol Use: No    Review of Systems  Constitutional: Negative for fever.  Respiratory: Positive for cough. Negative for chest tightness, shortness of breath and wheezing.   Cardiovascular: Negative for chest pain.  Skin: Negative for rash.  Neurological: Positive for headaches. Negative for syncope, weakness, light-headedness and numbness.      Allergies  Tramadol and Penicillins  Home Medications   Prior to Admission medications   Medication Sig Start Date End Date Taking? Authorizing Provider  acetaminophen (TYLENOL) 500 MG tablet Take 500 mg by mouth every 6 (six) hours as needed for moderate pain.    Historical  Provider, MD  diphenhydrAMINE (SOMINEX) 25 MG tablet Take 25 mg by mouth every 6 (six) hours as needed for allergies.    Historical Provider, MD  guaiFENesin (ROBITUSSIN) 100 MG/5ML liquid Take 5-10 mLs (100-200 mg total) by mouth every 4 (four) hours as needed for cough. 06/16/14   Comer Locket, PA-C  ibuprofen (ADVIL,MOTRIN) 800 MG tablet Take 1 tablet (800 mg total) by mouth 3 (three) times daily. Patient not taking: Reported on 04/07/2014 02/17/14   Charlann Lange, PA-C  meloxicam (MOBIC) 7.5 MG tablet Take 1 tablet (7.5 mg total) by mouth daily. 02/14/14   Fransico Meadow, PA-C  methocarbamol (ROBAXIN) 500 MG tablet Take 1 tablet (500 mg total) by mouth 2 (two) times daily. 02/14/14   Fransico Meadow, PA-C  triamcinolone (NASACORT AQ) 55 MCG/ACT AERO nasal inhaler Place 2 sprays into the nose daily. Patient not taking: Reported on 04/07/2014 02/17/14   Charlann Lange, PA-C   BP 121/76 mmHg  Pulse 67  Temp(Src) 98.1 F (36.7 C) (Oral)  SpO2 96% Physical Exam  Constitutional: He is oriented to person, place, and time.  Awake, alert, nontoxic appearance.  HENT:  Head: Atraumatic.  Mouth/Throat: Oropharynx is clear and moist. No oropharyngeal exudate.  Eyes: Right eye exhibits no discharge. Left eye exhibits no discharge.  Neck: Normal range of motion. Neck supple.  No meningismus or nuchal rigidity  Cardiovascular: Normal rate, regular rhythm and normal heart sounds.   Pulmonary/Chest: Effort normal and breath sounds  normal. No stridor. No respiratory distress. He has no wheezes. He has no rales. He exhibits no tenderness.  Abdominal: Soft. There is no tenderness. There is no rebound.  Musculoskeletal: Normal range of motion. He exhibits no tenderness.  Baseline ROM, no obvious new focal weakness.  Lymphadenopathy:    He has no cervical adenopathy.  Neurological: He is alert and oriented to person, place, and time. No cranial nerve deficit. Coordination normal.  Mental status and motor  strength appears baseline for patient and situation.  Skin: No rash noted.  Psychiatric: He has a normal mood and affect.  Nursing note and vitals reviewed.   ED Course  Procedures (including critical care time) Labs Review Labs Reviewed - No data to display  Imaging Review No results found.   EKG Interpretation None     Meds given in ED:  Medications - No data to display  Discharge Medication List as of 06/16/2014 10:46 AM    START taking these medications   Details  guaiFENesin (ROBITUSSIN) 100 MG/5ML liquid Take 5-10 mLs (100-200 mg total) by mouth every 4 (four) hours as needed for cough., Starting 06/16/2014, Until Discontinued, Print       Filed Vitals:   06/16/14 1011  BP: 121/76  Pulse: 67  Temp: 98.1 F (36.7 C)  TempSrc: Oral  SpO2: 96%    MDM  Vitals stable - WNL -afebrile Pt resting comfortably in ED. PE--normal lung exam. No meningismus or nuchal rigidity. Grossly benign physical exam.  DDX--patient with cough and nonspecific headache without fevers, neck stiffness, neck pain, chest pain or shortness of breath. Will treat cough symptomatically with Robitussin. Encouraged continued use of Tylenol at home for any discomfort he may experience. No evidence at this time for pneumonia or other acute bacterial infection. Headache not concerning for acute pathological process. No evidence of other acute or emergent pathology at this time.  I discussed all relevant lab findings and imaging results with pt and they verbalized understanding. Discussed f/u with PCP within 48 hrs and return precautions, pt very amenable to plan.  Final diagnoses:  Cough  URI (upper respiratory infection)        Comer Locket, PA-C 06/16/14 1126  Evelina Bucy, MD 06/16/14 1527

## 2014-06-16 NOTE — ED Notes (Signed)
Pt reports cough, headache and intermittent chills x 24 hours. Pt alert, oriented and appropriate. Took two tylenol and one dosage of cough medicine over the last 24 hours.Stated the he has an appointment with PCP tomorrow

## 2014-06-16 NOTE — Discharge Instructions (Signed)
Cough, Adult  A cough is a reflex. It helps you clear your throat and airways. A cough can help heal your body. A cough can last 2 or 3 weeks (acute) or may last more than 8 weeks (chronic). Some common causes of a cough can include an infection, allergy, or a cold. HOME CARE  Only take medicine as told by your doctor.  If given, take your medicines (antibiotics) as told. Finish them even if you start to feel better.  Use a cold steam vaporizer or humidifier in your home. This can help loosen thick spit (secretions).  Sleep so you are almost sitting up (semi-upright). Use pillows to do this. This helps reduce coughing.  Rest as needed.  Stop smoking if you smoke. GET HELP RIGHT AWAY IF:  You have yellowish-white fluid (pus) in your thick spit.  Your cough gets worse.  Your medicine does not reduce coughing, and you are losing sleep.  You cough up blood.  You have trouble breathing.  Your pain gets worse and medicine does not help.  You have a fever. MAKE SURE YOU:   Understand these instructions.  Will watch your condition.  Will get help right away if you are not doing well or get worse. Document Released: 11/12/2010 Document Revised: 07/16/2013 Document Reviewed: 11/12/2010 [facility] Patient Information 2015 [place], [place]. This information is not intended to replace advice given to you by your health care provider. Make sure you discuss any questions you have with your health care provider.  Upper Respiratory Infection, Adult An upper respiratory infection (URI) is also known as the common cold. It is often caused by a type of germ (virus). Colds are easily spread (contagious). You can pass it to others by kissing, coughing, sneezing, or drinking out of the same glass. Usually, you get better in 1 or 2 weeks.  HOME CARE   Only take medicine as told by your doctor.  Use a warm mist humidifier or breathe in steam from a hot shower.  Drink enough water and fluids to  keep your pee (urine) clear or pale yellow.  Get plenty of rest.  Return to work when your temperature is back to normal or as told by your doctor. You may use a face mask and wash your hands to stop your cold from spreading. GET HELP RIGHT AWAY IF:   After the first few days, you feel you are getting worse.  You have questions about your medicine.  You have chills, shortness of breath, or brown or red spit (mucus).  You have yellow or brown snot (nasal discharge) or pain in the face, especially when you bend forward.  You have a fever, puffy (swollen) neck, pain when you swallow, or white spots in the back of your throat.  You have a bad headache, ear pain, sinus pain, or chest pain.  You have a high-pitched whistling sound when you breathe in and out (wheezing).  You have a lasting cough or cough up blood.  You have sore muscles or a stiff neck. MAKE SURE YOU:   Understand these instructions.  Will watch your condition.  Will get help right away if you are not doing well or get worse. Document Released: 08/18/2007 Document Revised: 05/24/2011 Document Reviewed: 06/06/2013 [facility] Patient Information 2015 [place], [place]. This information is not intended to replace advice given to you by your health care provider. Make sure you discuss any questions you have with your health care provider.  Please take your medications as prescribed.  You may use an alternative medication as your pharmacist directs. Please follow-up with primary care for further evaluation and management of your symptoms. Return to ED for new or worsening symptoms.

## 2014-06-17 ENCOUNTER — Ambulatory Visit: Payer: Medicare Other | Admitting: Internal Medicine

## 2014-06-18 ENCOUNTER — Ambulatory Visit: Payer: Medicare Other | Attending: Family Medicine | Admitting: Family Medicine

## 2014-06-18 ENCOUNTER — Encounter: Payer: Self-pay | Admitting: Family Medicine

## 2014-06-18 VITALS — BP 112/71 | HR 90 | Temp 99.7°F | Resp 18 | Ht 64.0 in | Wt 147.0 lb

## 2014-06-18 DIAGNOSIS — Z114 Encounter for screening for human immunodeficiency virus [HIV]: Secondary | ICD-10-CM | POA: Diagnosis not present

## 2014-06-18 DIAGNOSIS — Z1211 Encounter for screening for malignant neoplasm of colon: Secondary | ICD-10-CM | POA: Insufficient documentation

## 2014-06-18 DIAGNOSIS — Z1159 Encounter for screening for other viral diseases: Secondary | ICD-10-CM | POA: Diagnosis not present

## 2014-06-18 DIAGNOSIS — F17210 Nicotine dependence, cigarettes, uncomplicated: Secondary | ICD-10-CM | POA: Insufficient documentation

## 2014-06-18 DIAGNOSIS — R6889 Other general symptoms and signs: Secondary | ICD-10-CM | POA: Insufficient documentation

## 2014-06-18 DIAGNOSIS — E876 Hypokalemia: Secondary | ICD-10-CM | POA: Insufficient documentation

## 2014-06-18 DIAGNOSIS — Z720 Tobacco use: Secondary | ICD-10-CM | POA: Diagnosis not present

## 2014-06-18 HISTORY — DX: Nicotine dependence, cigarettes, uncomplicated: F17.210

## 2014-06-18 LAB — COMPLETE METABOLIC PANEL WITH GFR
ALBUMIN: 4.4 g/dL (ref 3.5–5.2)
ALK PHOS: 62 U/L (ref 39–117)
ALT: 36 U/L (ref 0–53)
AST: 37 U/L (ref 0–37)
BUN: 21 mg/dL (ref 6–23)
CALCIUM: 9.2 mg/dL (ref 8.4–10.5)
CHLORIDE: 95 meq/L — AB (ref 96–112)
CO2: 29 mEq/L (ref 19–32)
Creat: 1.11 mg/dL (ref 0.50–1.35)
GFR, Est African American: >89 mL/min
GFR, Est Non African American: 80 mL/min
GLUCOSE: 74 mg/dL (ref 70–99)
POTASSIUM: 4.2 meq/L (ref 3.5–5.3)
Sodium: 133 mEq/L — ABNORMAL LOW (ref 135–145)
TOTAL PROTEIN: 7.2 g/dL (ref 6.0–8.3)
Total Bilirubin: 0.5 mg/dL (ref 0.2–1.2)

## 2014-06-18 LAB — MAGNESIUM: Magnesium: 2.2 mg/dL (ref 1.5–2.5)

## 2014-06-18 MED ORDER — IBUPROFEN 600 MG PO TABS: 600.0000 mg | ORAL_TABLET | Freq: Three times a day (TID) | ORAL | Status: DC | PRN

## 2014-06-18 MED ORDER — ACETAMINOPHEN 500 MG PO TABS: 1000.0000 mg | ORAL_TABLET | Freq: Three times a day (TID) | ORAL | Status: DC | PRN

## 2014-06-18 MED ORDER — ONDANSETRON 8 MG PO TBDP: 8.0000 mg | ORAL_TABLET | Freq: Three times a day (TID) | ORAL | Status: DC | PRN

## 2014-06-18 NOTE — Assessment & Plan Note (Signed)
History of low potassium: CMP today.

## 2014-06-18 NOTE — Assessment & Plan Note (Signed)
Smoking cessation support: smoking cessation hotline: 1-800-QUIT-NOW.  Smoking cessation classes are available through [facility]. Call 336-832-9999 or visit our website at [email or web address].   

## 2014-06-18 NOTE — Progress Notes (Signed)
Establish Care Complaining body ache, no energy Sweating, nausea, dry cough

## 2014-06-18 NOTE — Assessment & Plan Note (Addendum)
Healthcare maintenance: due for screening HIV

## 2014-06-18 NOTE — Assessment & Plan Note (Signed)
Flu like symptoms: treatment is time and symptom control   Zofran for nausea Alternate tylenol and motrin for pain you can take both three times a day 4 hours apart Drink plenty of fluids.  CBC today

## 2014-06-18 NOTE — Patient Instructions (Addendum)
[name],  Thank you for coming in today. It was a pleasure meeting you. I look forward to being your primary doctor.   1. Flu like symptoms: treatment is time and symptom control   Zofran for nausea Alternate tylenol and motrin for pain you can take both three times a day 4 hours apart Drink plenty of fluids.  CBC today   2. History of low potassium: CMP today.  3. Healthcare maintenance: due for screening HIV  4. Smoking cessation support: smoking cessation hotline: 1-800-QUIT-NOW.  Smoking cessation classes are available through [facility]. Call [phone] or visit our website at [email or web address]  F/u in 2 weeks if no better  Dr. Adrian Blackwater

## 2014-06-18 NOTE — Progress Notes (Signed)
   Subjective:    Patient ID: NGUYEN TODOROV, male    DOB: 14-Feb-1971, 44 y.o.   MRN: [id number] CC: establish care, flu like symptoms  HPI  1. Flu like symptoms: headache, congestion, cough, chest discomfort, nausea, diarrhea, abdominal pain x 4 days. Sick contact is girlfriend. T max is 103 last night. Took ibuprofen for aches and fever.   Soc Hx: smokes 2 cigs per day Med Hx: hypokalemia Fam Hx: mom with HTN  Review of Systems  Constitutional: Positive for fever and fatigue. Negative for chills.  HENT: Positive for congestion. Negative for sore throat.   Respiratory: Positive for cough and shortness of breath.   Cardiovascular: Positive for chest pain. Negative for palpitations and leg swelling.  Gastrointestinal: Positive for nausea, vomiting, abdominal pain and diarrhea.  Genitourinary: Positive for flank pain. Negative for dysuria.  Musculoskeletal: Positive for myalgias.  Allergic/Immunologic: Negative for immunocompromised state.  Neurological: Positive for headaches. Negative for dizziness, tremors, syncope, weakness, light-headedness and numbness.  Psychiatric/Behavioral: Negative.        Objective:   Physical Exam BP 112/71 mmHg  Pulse 90  Temp(Src) 99.7 F (37.6 C) (Oral)  Resp 18  Ht 5\' 4"  (1.626 m)  Wt 147 lb (66.679 kg)  BMI 25.22 kg/m2  SpO2 97% General appearance: alert, cooperative and no distress Head: Normocephalic, without obvious abnormality, atraumatic Eyes: conjunctivae/corneas clear. PERRL, EOM's intact. Nose: no discharge, turbinates normal, swollen Throat: lips, mucosa, and tongue normal; teeth and gums normal Neck: no adenopathy, supple, symmetrical, trachea midline and thyroid not enlarged, symmetric, no tenderness/mass/nodules Lungs: clear to auscultation bilaterally Heart: regular rate and rhythm, S1, S2 normal, no murmur, click, rub or gallop Abdomen: soft, non-tender; bowel sounds normal; no masses,  no organomegaly       Assessment  & Plan:

## 2014-06-19 LAB — CBC
HEMATOCRIT: 45.4 % (ref 39.0–52.0)
HEMOGLOBIN: 15.4 g/dL (ref 13.0–17.0)
MCH: 29.3 pg (ref 26.0–34.0)
MCHC: 33.9 g/dL (ref 30.0–36.0)
MCV: 86.5 fL (ref 78.0–100.0)
MPV: 11.8 fL (ref 8.6–12.4)
PLATELETS: 132 10*3/uL — AB (ref 150–400)
RBC: 5.25 MIL/uL (ref 4.22–5.81)
RDW: 15.6 % — AB (ref 11.5–15.5)
WBC: 3.3 10*3/uL — AB (ref 4.0–10.5)

## 2014-06-19 LAB — HIV ANTIBODY (ROUTINE TESTING W REFLEX): HIV: NONREACTIVE

## 2014-06-25 ENCOUNTER — Ambulatory Visit: Payer: Medicare Other | Admitting: Internal Medicine

## 2014-06-27 ENCOUNTER — Telehealth: Payer: Self-pay | Admitting: *Deleted

## 2014-06-27 NOTE — Telephone Encounter (Signed)
Left voice message to return call 

## 2014-06-27 NOTE — Telephone Encounter (Signed)
Pt called is returning nurse's phone call. Please f/u

## 2014-06-27 NOTE — Telephone Encounter (Signed)
Pt returned call. Aware of lab results Advised to drink plenty of water

## 2014-06-27 NOTE — Telephone Encounter (Signed)
-----   Message from Boykin Nearing, MD sent at 06/19/2014  8:45 AM EDT ----- Screening HIV negative Slight low Na and Cl consistent with current viral illness, drink plenty of fluids Normal potassium and magnesium  Normal CBC except slight low WBC

## 2014-06-28 DIAGNOSIS — G5602 Carpal tunnel syndrome, left upper limb: Secondary | ICD-10-CM | POA: Diagnosis not present

## 2014-06-28 DIAGNOSIS — S63522D Sprain of radiocarpal joint of left wrist, subsequent encounter: Secondary | ICD-10-CM | POA: Diagnosis not present

## 2014-07-10 DIAGNOSIS — G5602 Carpal tunnel syndrome, left upper limb: Secondary | ICD-10-CM | POA: Diagnosis not present

## 2014-07-23 DIAGNOSIS — Z4789 Encounter for other orthopedic aftercare: Secondary | ICD-10-CM | POA: Diagnosis not present

## 2014-08-14 ENCOUNTER — Encounter: Payer: Self-pay | Admitting: Family Medicine

## 2014-08-14 ENCOUNTER — Ambulatory Visit: Payer: Medicare Other | Attending: Family Medicine | Admitting: Family Medicine

## 2014-08-14 VITALS — BP 124/78 | HR 71 | Temp 98.1°F | Resp 16 | Ht 64.0 in | Wt 156.0 lb

## 2014-08-14 DIAGNOSIS — B356 Tinea cruris: Secondary | ICD-10-CM | POA: Diagnosis not present

## 2014-08-14 MED ORDER — FLUCONAZOLE 150 MG PO TABS: 150.0000 mg | ORAL_TABLET | ORAL | Status: DC

## 2014-08-14 NOTE — Patient Instructions (Addendum)
Mr. Frenette,  Thank you for coming in today  1. Chronic fungal groin rash: Take diflucan once weekly for 2-6 weeks.  Keep skin cool and dry. Try to avoid scratching.  F/u in 8 weeks   Dr. Adrian Blackwater

## 2014-08-14 NOTE — Progress Notes (Signed)
   Subjective:    Patient ID: [name], male    DOB: [birth year], 44 y.o.   MRN: [id number] CC: groin rash  HPI  1. Groin rash: x 5 years. Pruritic. No improvement with medicated ointment or powder. No rash on any other part of his body. Mom with DM2. Patient with normal blood sugars and negative screening HIV.   Soc Hx: smoker  Review of Systems  Skin: Positive for color change and rash.  Hematological: Negative.        Objective:   Physical Exam BP 124/78 mmHg  Pulse 71  Temp(Src) 98.1 F (36.7 C) (Oral)  Resp 16  Ht 5\' 4"  (1.626 m)  Wt 156 lb (70.761 kg)  BMI 26.76 kg/m2  SpO2 97% General appearance: alert, cooperative and no distress Male genitalia: normal Skin: hyperpigmented, lichenified rash in L groin, no ulceration, adenopathy or satellite lesions        Assessment & Plan:

## 2014-08-14 NOTE — Progress Notes (Signed)
Complaining of swelling around groin area No pain, itching x 2 years

## 2014-08-14 NOTE — Assessment & Plan Note (Signed)
Chronic fungal groin rash: Take diflucan once weekly for 2-6 weeks.  Keep skin cool and dry. Try to avoid scratching.  F/u in 8 weeks

## 2014-08-20 DIAGNOSIS — Z4789 Encounter for other orthopedic aftercare: Secondary | ICD-10-CM | POA: Diagnosis not present

## 2014-09-18 ENCOUNTER — Emergency Department (HOSPITAL_COMMUNITY)
Admission: EM | Admit: 2014-09-18 | Discharge: 2014-09-18 | Disposition: A | Discharge status: Home / Self Care | Payer: Medicare Other | Attending: Emergency Medicine | Admitting: Emergency Medicine

## 2014-09-18 ENCOUNTER — Encounter (HOSPITAL_COMMUNITY): Payer: Self-pay

## 2014-09-18 DIAGNOSIS — D1779 Benign lipomatous neoplasm of other sites: Secondary | ICD-10-CM | POA: Diagnosis not present

## 2014-09-18 DIAGNOSIS — Z8639 Personal history of other endocrine, nutritional and metabolic disease: Secondary | ICD-10-CM | POA: Diagnosis not present

## 2014-09-18 DIAGNOSIS — D179 Benign lipomatous neoplasm, unspecified: Secondary | ICD-10-CM

## 2014-09-18 DIAGNOSIS — Z880 Allergy status to penicillin: Secondary | ICD-10-CM | POA: Insufficient documentation

## 2014-09-18 DIAGNOSIS — Z720 Tobacco use: Secondary | ICD-10-CM | POA: Diagnosis not present

## 2014-09-18 DIAGNOSIS — D171 Benign lipomatous neoplasm of skin and subcutaneous tissue of trunk: Secondary | ICD-10-CM | POA: Insufficient documentation

## 2014-09-18 DIAGNOSIS — R222 Localized swelling, mass and lump, trunk: Secondary | ICD-10-CM | POA: Diagnosis present

## 2014-09-18 NOTE — ED Provider Notes (Signed)
CSN: [id number]     Arrival date & time 09/18/14  4742 History   First MD Initiated Contact with Patient 09/18/14 [phone]     Chief Complaint  Patient presents with  . Abscess   HPI   44 year old male presents today with a knot to his right ribs. Patient reports he has been there for the last 4 days, discovered by his significant other. Patient reports pain to the site, denies surrounding soft tissue swelling inflammation, no history of trauma. Patient denies fever or chills, nausea, vomiting, pain with deep inspiration, shortness of breath, or any other concerning signs or symptoms no history of the same previously.  Past Medical History  Diagnosis Date  . Hypokalemia    Past Surgical History  Procedure Laterality Date  . Orif facial fracture    . Hand surgery  2004   Family History  Problem Relation Age of Onset  . Hypertension Mother    History  Substance Use Topics  . Smoking status: Current Every Day Smoker -- 0.00 packs/day    Types: Cigarettes  . Smokeless tobacco: Not on file  . Alcohol Use: No    Review of Systems  All other systems reviewed and are negative.   Allergies  Tramadol and Penicillins  Home Medications   Prior to Admission medications   Medication Sig Start Date End Date Taking? Authorizing Provider  acetaminophen (TYLENOL) 500 MG tablet Take 2 tablets (1,000 mg total) by mouth every 8 (eight) hours as needed for moderate pain. 06/18/14   Josalyn Funches, MD  diphenhydrAMINE (SOMINEX) 25 MG tablet Take 25 mg by mouth every 6 (six) hours as needed for allergies.    Historical Provider, MD  fluconazole (DIFLUCAN) 150 MG tablet Take 1 tablet (150 mg total) by mouth once a week. 08/14/14   Josalyn Funches, MD  ibuprofen (ADVIL,MOTRIN) 600 MG tablet Take 1 tablet (600 mg total) by mouth every 8 (eight) hours as needed. 06/18/14   Josalyn Funches, MD   BP 143/87 mmHg  Pulse 71  Temp(Src) 97.6 F (36.4 C) (Oral)  Resp 20  SpO2 97%    Physical Exam   Constitutional: He is oriented to person, place, and time. He appears well-developed and well-nourished.  HENT:  Head: Normocephalic and atraumatic.  Eyes: Conjunctivae are normal. Pupils are equal, round, and reactive to light. Right eye exhibits no discharge. Left eye exhibits no discharge. No scleral icterus.  Neck: Normal range of motion. No JVD present. No tracheal deviation present.  Pulmonary/Chest: Effort normal. No stridor.  Neurological: He is alert and oriented to person, place, and time. Coordination normal.  Skin:  Firm 1 cm nodule to the right lateral rib cage, mobile.  Psychiatric: He has a normal mood and affect. His behavior is normal. Judgment and thought content normal.  Nursing note and vitals reviewed.   ED Course  Procedures (including critical care time)  EMERGENCY DEPARTMENT US SOFT TISSUE INTERPRETATION "Study: Limited Ultrasound of the noted body part in comments below"  INDICATIONS: Pain Multiple views of the body part are obtained with a multi-frequency linear probe  PERFORMED BY:  Myself  IMAGES ARCHIVED?: Yes  SIDE:Right   BODY PART:Chest Wall  FINDINGS: No abcess noted  LIMITATIONS:  {none  INTERPRETATION:  No abcess noted  COMMENT:  Images likely represent lipoma   Labs Review Labs Reviewed - No data to display  Imaging Review No results found.   EKG Interpretation None      MDM   Final diagnoses:  Lipoma    Labs:  Imaging:  Consults:  Therapeutics:  Assessment:  Plan: Patient presents with a nodule to his right lateral chest wall overlying the ribs. Mass is mobile, nonfluctuant, ultrasound was obtained by myself. Likely represents a lipoma. Patient was instructed to monitor for changes including redness, swelling, pain, surrounding cellulitis. Patient given strict return precautions, verbalizes understanding and agreement for today's plan and had no further questions concerns at the time of  discharge      Okey Regal, PA-C 09/19/14 0800  Dorie Rank, MD 09/19/14 [phone]

## 2014-09-18 NOTE — Discharge Instructions (Signed)
Lipoma A lipoma is a noncancerous (benign) tumor composed of fat cells. They are usually found under the skin (subcutaneous). A lipoma may occur in any tissue of the body that contains fat. Common areas for lipomas to appear include the back, shoulders, buttocks, and thighs. Lipomas are a very common soft tissue growth. They are soft and grow slowly. Most problems caused by a lipoma depend on where it is growing. DIAGNOSIS  A lipoma can be diagnosed with a physical exam. These tumors rarely become cancerous, but radiographic studies can help determine this for certain. Studies used may include:  Computerized X-ray scans (CT or CAT scan).  Computerized magnetic scans (MRI). TREATMENT  Small lipomas that are not causing problems may be watched. If a lipoma continues to enlarge or causes problems, removal is often the best treatment. Lipomas can also be removed to improve appearance. Surgery is done to remove the fatty cells and the surrounding capsule. Most often, this is done with medicine that numbs the area (local anesthetic). The removed tissue is examined under a microscope to make sure it is not cancerous. Keep all follow-up appointments with your caregiver. SEEK MEDICAL CARE IF:   The lipoma becomes larger or hard.  The lipoma becomes painful, red, or increasingly swollen. These could be signs of infection or a more serious condition. Document Released: 02/19/2002 Document Revised: 05/24/2011 Document Reviewed: 08/01/2009 [facility] Patient Information 2015 [place], [place]. This information is not intended to replace advice given to you by your health care provider. Make sure you discuss any questions you have with your health care provider.Please   Please follow-up with your primary care provider in 3 days for reevaluation. Please return to the emergency room if new worsening signs or symptoms present

## 2014-09-18 NOTE — ED Notes (Signed)
Pt c/o increasing abscess on R ribcage x 4 days.  Pain score 8/10.  Pt has not taken anything for pain.

## 2014-09-26 ENCOUNTER — Ambulatory Visit: Payer: Medicare Other | Admitting: Family Medicine

## 2014-09-27 DIAGNOSIS — Z4789 Encounter for other orthopedic aftercare: Secondary | ICD-10-CM | POA: Diagnosis not present

## 2014-10-01 ENCOUNTER — Ambulatory Visit: Payer: Medicare Other | Attending: Family Medicine | Admitting: Family Medicine

## 2014-10-01 ENCOUNTER — Encounter: Payer: Self-pay | Admitting: Family Medicine

## 2014-10-01 VITALS — BP 119/73 | HR 68 | Temp 98.6°F | Resp 16 | Ht 64.0 in | Wt 153.0 lb

## 2014-10-01 DIAGNOSIS — M791 Myalgia: Secondary | ICD-10-CM | POA: Insufficient documentation

## 2014-10-01 DIAGNOSIS — D171 Benign lipomatous neoplasm of skin and subcutaneous tissue of trunk: Secondary | ICD-10-CM | POA: Diagnosis not present

## 2014-10-01 DIAGNOSIS — F17210 Nicotine dependence, cigarettes, uncomplicated: Secondary | ICD-10-CM | POA: Diagnosis not present

## 2014-10-01 DIAGNOSIS — M549 Dorsalgia, unspecified: Secondary | ICD-10-CM | POA: Diagnosis present

## 2014-10-01 MED ORDER — NAPROXEN 500 MG PO TABS: 500.0000 mg | ORAL_TABLET | Freq: Two times a day (BID) | ORAL | Status: DC | PRN

## 2014-10-01 NOTE — Progress Notes (Signed)
Possible cyst on back rt side Pain and worsen with cough and eating  Hx tobacco 2 cigarette per day

## 2014-10-01 NOTE — Patient Instructions (Addendum)
Mr. Gasparro,   Thank you for coming in today.  1. Lipoma: Gen surg referral placed Naproxen for pain   F/u in 3 months  Dr. Adrian Blackwater  Lipoma A lipoma is a noncancerous (benign) tumor composed of fat cells. They are usually found under the skin (subcutaneous). A lipoma may occur in any tissue of the body that contains fat. Common areas for lipomas to appear include the back, shoulders, buttocks, and thighs. Lipomas are a very common soft tissue growth. They are soft and grow slowly. Most problems caused by a lipoma depend on where it is growing. DIAGNOSIS  A lipoma can be diagnosed with a physical exam. These tumors rarely become cancerous, but radiographic studies can help determine this for certain. Studies used may include:  Computerized X-ray scans (CT or CAT scan).  Computerized magnetic scans (MRI). TREATMENT  Small lipomas that are not causing problems may be watched. If a lipoma continues to enlarge or causes problems, removal is often the best treatment. Lipomas can also be removed to improve appearance. Surgery is done to remove the fatty cells and the surrounding capsule. Most often, this is done with medicine that numbs the area (local anesthetic). The removed tissue is examined under a microscope to make sure it is not cancerous. Keep all follow-up appointments with your caregiver. SEEK MEDICAL CARE IF:   The lipoma becomes larger or hard.  The lipoma becomes painful, red, or increasingly swollen. These could be signs of infection or a more serious condition. Document Released: 02/19/2002 Document Revised: 05/24/2011 Document Reviewed: 08/01/2009 [facility] Patient Information 2015 [place], [place]. This information is not intended to replace advice given to you by your health care provider. Make sure you discuss any questions you have with your health care provider.

## 2014-10-01 NOTE — Assessment & Plan Note (Signed)
Lipoma: Gen surg referral placed Naproxen for pain

## 2014-10-01 NOTE — Progress Notes (Signed)
   Subjective:    Patient ID: [name], male    DOB: [birth year], 44 y.o.   MRN: [id number] CC; R sided back pain  HPI 44 yo M presents for f/u appt   1. Lipoma: R flank x 2 weeks. Went to ED, bedside US did not reveal abscess. Area is tender but pain and swelling has improved. Patient interested in gen surg referral for lipoma excision.   Soc Hx: smokes 2 cigs per day  Review of Systems  Constitutional: Negative for fever and chills.  Musculoskeletal: Positive for myalgias.       Objective:   Physical Exam BP 119/73 mmHg  Pulse 68  Temp(Src) 98.6 F (37 C)  Resp 16  Ht 5\' 4"  (1.626 m)  Wt 153 lb (69.4 kg)  BMI 26.25 kg/m2  SpO2 98% General appearance: alert, cooperative and no distress Back: 2 cm x 3 cm lipoma R flank, tender      Assessment & Plan:

## 2014-10-17 ENCOUNTER — Other Ambulatory Visit: Payer: Self-pay | Admitting: General Surgery

## 2014-10-17 DIAGNOSIS — R222 Localized swelling, mass and lump, trunk: Secondary | ICD-10-CM | POA: Diagnosis not present

## 2014-10-17 DIAGNOSIS — Z720 Tobacco use: Secondary | ICD-10-CM | POA: Diagnosis not present

## 2014-10-25 DIAGNOSIS — Z4789 Encounter for other orthopedic aftercare: Secondary | ICD-10-CM | POA: Diagnosis not present

## 2014-10-25 DIAGNOSIS — G5602 Carpal tunnel syndrome, left upper limb: Secondary | ICD-10-CM | POA: Diagnosis not present

## 2014-11-04 DIAGNOSIS — G5601 Carpal tunnel syndrome, right upper limb: Secondary | ICD-10-CM | POA: Diagnosis not present

## 2014-11-07 ENCOUNTER — Encounter (HOSPITAL_BASED_OUTPATIENT_CLINIC_OR_DEPARTMENT_OTHER): Payer: Self-pay | Admitting: *Deleted

## 2014-11-07 NOTE — H&P (Signed)
  [name]. [place]  Location: [facility] Patient #: [id number] DOB: October 16, 1970 Single / Language: Cleophus Molt / Race: Black or African American Male       History of Present Illness   The patient is a 44 year old male who presents with a complaint of 4 cm subcutaneous mass right . This is a 44 year old [place] American male, referred to me by Dr. Boykin Nearing at the [facility] for evaluation and management of a soft tissue mass of the right flank. He states he noted this about a month ago. Never painful. Never red. No drainage. No pain and no change in size. Apparently seen in the ED and ultrasound was done which showed no abscess. Then he was seen at [facility] whenever [place]. There he was sent to me. 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 He was given the option of doing nothing or having it excised. He very strongly wanted this area excised. He will be scheduled for excision of a 4 cm soft tissue mass of his right flank. I discussed the indications, details, techniques, and numerous risk of the surgery with him. He is aware the risks of bleeding, infection, nerve damage, chronic pain or numbness, recurrence of the mass. He understands these issues well. All of his questions are answered. His mother is with him today and she also understands. He agrees with this plan.   Allergies  Penicillin G Potassium *PENICILLINS* TraMADol HCl *CHEMICALS*  Medication History No Current Medications Medications Reconciled  Vitals  Weight: 152 lb Height: 64in Body Surface Area: 1.76 m Body Mass Index: 26.09 kg/m Temp.: 97.5F  Pulse: 60 (Regular)  BP: 130/68 (Sitting, Left Arm, Standard)    Physical Exam  General Note: Alert. No distress. Cooperative.   Head and Neck Note: No adenopathy or mass   Chest and Lung Exam Note: Clear to auscultation  bilaterally. In the right flank overlying the lower aspect of the rib cage is a 4 cm x 2.5 cm soft tissue mass. This feels like a firm lipoma. The skin is normal. There is no axillary adenopathy.   Cardiovascular Note: Regular rate and rhythm. No murmur. No ectopy.   Neurologic Note: Alert and oriented 4. No gross motor or sensory deficits. Gait normal.   Musculoskeletal Note: Moves all 4 extremities well with equal strength. Bilateral wrist scars from carpal tunnel surgery.     Assessment & Plan  MASS OF TORSO (786.6  R22.2)   Schedule for Surgery You have a 4 cm subcutaneous mass of your right flank. This is almost certainly a benign lipoma Excision of this is elective. You have stated you would like this area excised. We have discussed the indications, techniques, and numerous risk of this surgery The surgery will be scheduled.  TOBACCO ABUSE (305.1  Z72.0)     Ruberta Holck M. Dalbert Batman, M.D., [facility] General and Minimally invasive Surgery Breast and Colorectal Surgery Office:   [phone] Pager:   [phone]  Signed by [name], MD (10/17/2014 1:49 PM)

## 2014-11-08 ENCOUNTER — Ambulatory Visit (HOSPITAL_BASED_OUTPATIENT_CLINIC_OR_DEPARTMENT_OTHER): Payer: Medicare Other | Admitting: Anesthesiology

## 2014-11-08 ENCOUNTER — Ambulatory Visit (HOSPITAL_BASED_OUTPATIENT_CLINIC_OR_DEPARTMENT_OTHER)
Admission: RE | Admit: 2014-11-08 | Discharge: 2014-11-08 | Disposition: A | Discharge status: Home / Self Care | Payer: Medicare Other | Source: Ambulatory Visit | Attending: General Surgery | Admitting: General Surgery

## 2014-11-08 ENCOUNTER — Encounter (HOSPITAL_BASED_OUTPATIENT_CLINIC_OR_DEPARTMENT_OTHER): Payer: Self-pay | Admitting: *Deleted

## 2014-11-08 ENCOUNTER — Encounter (HOSPITAL_BASED_OUTPATIENT_CLINIC_OR_DEPARTMENT_OTHER)
Admission: RE | Disposition: A | Discharge status: Home / Self Care | Payer: Self-pay | Source: Ambulatory Visit | Attending: General Surgery

## 2014-11-08 DIAGNOSIS — F17210 Nicotine dependence, cigarettes, uncomplicated: Secondary | ICD-10-CM | POA: Insufficient documentation

## 2014-11-08 DIAGNOSIS — Z79899 Other long term (current) drug therapy: Secondary | ICD-10-CM | POA: Insufficient documentation

## 2014-11-08 DIAGNOSIS — R229 Localized swelling, mass and lump, unspecified: Secondary | ICD-10-CM | POA: Diagnosis present

## 2014-11-08 DIAGNOSIS — D171 Benign lipomatous neoplasm of skin and subcutaneous tissue of trunk: Secondary | ICD-10-CM | POA: Diagnosis present

## 2014-11-08 DIAGNOSIS — Z880 Allergy status to penicillin: Secondary | ICD-10-CM | POA: Diagnosis not present

## 2014-11-08 DIAGNOSIS — M799 Soft tissue disorder, unspecified: Secondary | ICD-10-CM | POA: Diagnosis not present

## 2014-11-08 DIAGNOSIS — R222 Localized swelling, mass and lump, trunk: Secondary | ICD-10-CM | POA: Diagnosis not present

## 2014-11-08 HISTORY — PX: MASS EXCISION: SHX2000

## 2014-11-08 HISTORY — DX: Headache, unspecified: R51.9

## 2014-11-08 HISTORY — DX: Headache: R51

## 2014-11-08 LAB — POCT HEMOGLOBIN-HEMACUE: HEMOGLOBIN: 14.7 g/dL (ref 13.0–17.0)

## 2014-11-08 SURGERY — EXCISION MASS
Anesthesia: General | Site: Flank | Laterality: Right

## 2014-11-08 MED ORDER — MIDAZOLAM HCL 2 MG/2ML IJ SOLN
INTRAMUSCULAR | Status: AC
  Filled 2014-11-08: qty 2

## 2014-11-08 MED ORDER — FENTANYL CITRATE (PF) 100 MCG/2ML IJ SOLN
INTRAMUSCULAR | Status: AC
  Filled 2014-11-08: qty 6

## 2014-11-08 MED ORDER — LIDOCAINE HCL (CARDIAC) 20 MG/ML IV SOLN
INTRAVENOUS | Status: DC | PRN
  Administered 2014-11-08: 50 mg via INTRAVENOUS

## 2014-11-08 MED ORDER — BUPIVACAINE-EPINEPHRINE (PF) 0.5% -1:200000 IJ SOLN
INTRAMUSCULAR | Status: AC
  Filled 2014-11-08: qty 30

## 2014-11-08 MED ORDER — MIDAZOLAM HCL 2 MG/2ML IJ SOLN
1.0000 mg | INTRAMUSCULAR | Status: DC | PRN
  Administered 2014-11-08: 2 mg via INTRAVENOUS

## 2014-11-08 MED ORDER — LIDOCAINE HCL (PF) 1 % IJ SOLN
INTRAMUSCULAR | Status: AC
  Filled 2014-11-08: qty 30

## 2014-11-08 MED ORDER — BUPIVACAINE-EPINEPHRINE 0.5% -1:200000 IJ SOLN
INTRAMUSCULAR | Status: DC | PRN
  Administered 2014-11-08: 10 mL

## 2014-11-08 MED ORDER — CEFAZOLIN SODIUM-DEXTROSE 2-3 GM-% IV SOLR
2.0000 g | INTRAVENOUS | Status: AC
  Administered 2014-11-08: 2 g via INTRAVENOUS

## 2014-11-08 MED ORDER — SCOPOLAMINE 1 MG/3DAYS TD PT72: 1.0000 | MEDICATED_PATCH | Freq: Once | TRANSDERMAL | Status: DC | PRN

## 2014-11-08 MED ORDER — ONDANSETRON HCL 4 MG/2ML IJ SOLN
INTRAMUSCULAR | Status: DC | PRN
  Administered 2014-11-08: 4 mg via INTRAVENOUS

## 2014-11-08 MED ORDER — FENTANYL CITRATE (PF) 100 MCG/2ML IJ SOLN
50.0000 ug | INTRAMUSCULAR | Status: DC | PRN
  Administered 2014-11-08: 100 ug via INTRAVENOUS
  Administered 2014-11-08: 50 ug via INTRAVENOUS

## 2014-11-08 MED ORDER — CEFAZOLIN SODIUM-DEXTROSE 2-3 GM-% IV SOLR
INTRAVENOUS | Status: AC
  Filled 2014-11-08: qty 50

## 2014-11-08 MED ORDER — METRONIDAZOLE IN NACL 5-0.79 MG/ML-% IV SOLN: 500.0000 mg | INTRAVENOUS | Status: DC

## 2014-11-08 MED ORDER — FENTANYL CITRATE (PF) 100 MCG/2ML IJ SOLN
INTRAMUSCULAR | Status: AC
Start: 2014-11-08 — End: 2014-11-08
  Filled 2014-11-08: qty 2

## 2014-11-08 MED ORDER — DEXAMETHASONE SODIUM PHOSPHATE 4 MG/ML IJ SOLN
INTRAMUSCULAR | Status: DC | PRN
  Administered 2014-11-08: 10 mg via INTRAVENOUS

## 2014-11-08 MED ORDER — OXYCODONE-ACETAMINOPHEN 7.5-325 MG PO TABS: 1.0000 | ORAL_TABLET | ORAL | Status: DC | PRN

## 2014-11-08 MED ORDER — CHLORHEXIDINE GLUCONATE 4 % EX LIQD: 1.0000 "application " | Freq: Once | CUTANEOUS | Status: DC

## 2014-11-08 MED ORDER — GLYCOPYRROLATE 0.2 MG/ML IJ SOLN: 0.2000 mg | Freq: Once | INTRAMUSCULAR | Status: DC | PRN

## 2014-11-08 MED ORDER — LACTATED RINGERS IV SOLN
INTRAVENOUS | Status: DC
  Administered 2014-11-08: 07:00:00 via INTRAVENOUS

## 2014-11-08 SURGICAL SUPPLY — 58 items
ADH SKN CLS APL DERMABOND .7 (GAUZE/BANDAGES/DRESSINGS) ×1
APL SKNCLS STERI-STRIP NONHPOA (GAUZE/BANDAGES/DRESSINGS)
BANDAGE ELASTIC 6 VELCRO ST LF (GAUZE/BANDAGES/DRESSINGS) IMPLANT
BENZOIN TINCTURE PRP APPL 2/3 (GAUZE/BANDAGES/DRESSINGS) IMPLANT
BLADE HEX COATED 2.75 (ELECTRODE) ×3 IMPLANT
BLADE SURG 15 STRL LF DISP TIS (BLADE) ×2 IMPLANT
BLADE SURG 15 STRL SS (BLADE) ×3
CANISTER SUCT 1200ML W/VALVE (MISCELLANEOUS) ×1 IMPLANT
CHLORAPREP W/TINT 26ML (MISCELLANEOUS) ×3 IMPLANT
CLOSURE WOUND 1/2 X4 (GAUZE/BANDAGES/DRESSINGS)
COVER BACK TABLE 60X90IN (DRAPES) ×3 IMPLANT
COVER MAYO STAND STRL (DRAPES) ×3 IMPLANT
DECANTER SPIKE VIAL GLASS SM (MISCELLANEOUS) IMPLANT
DERMABOND ADVANCED (GAUZE/BANDAGES/DRESSINGS) ×2
DERMABOND ADVANCED .7 DNX12 (GAUZE/BANDAGES/DRESSINGS) IMPLANT
DRAPE LAPAROTOMY 100X72 PEDS (DRAPES) ×3 IMPLANT
DRAPE LAPAROTOMY TRNSV 102X78 (DRAPE) IMPLANT
DRAPE UTILITY XL STRL (DRAPES) ×3 IMPLANT
ELECT REM PT RETURN 9FT ADLT (ELECTROSURGICAL) ×3
ELECTRODE REM PT RTRN 9FT ADLT (ELECTROSURGICAL) ×1 IMPLANT
GAUZE SPONGE 4X4 16PLY XRAY LF (GAUZE/BANDAGES/DRESSINGS) IMPLANT
GLOVE ECLIPSE 6.5 STRL STRAW (GLOVE) ×2 IMPLANT
GLOVE EUDERMIC 7 POWDERFREE (GLOVE) ×3 IMPLANT
GLOVE EXAM NITRILE EXT CUFF MD (GLOVE) ×2 IMPLANT
GOWN STRL REUS W/ TWL LRG LVL3 (GOWN DISPOSABLE) ×1 IMPLANT
GOWN STRL REUS W/ TWL XL LVL3 (GOWN DISPOSABLE) ×1 IMPLANT
GOWN STRL REUS W/TWL LRG LVL3 (GOWN DISPOSABLE) ×3
GOWN STRL REUS W/TWL XL LVL3 (GOWN DISPOSABLE) ×3
NDL HYPO 25X1 1.5 SAFETY (NEEDLE) ×1 IMPLANT
NEEDLE HYPO 22GX1.5 SAFETY (NEEDLE) IMPLANT
NEEDLE HYPO 25X1 1.5 SAFETY (NEEDLE) ×3 IMPLANT
NS IRRIG 1000ML POUR BTL (IV SOLUTION) ×3 IMPLANT
PACK BASIN DAY SURGERY FS (CUSTOM PROCEDURE TRAY) ×3 IMPLANT
PENCIL BUTTON HOLSTER BLD 10FT (ELECTRODE) ×3 IMPLANT
SHEET MEDIUM DRAPE 40X70 STRL (DRAPES) IMPLANT
SLEEVE SCD COMPRESS KNEE MED (MISCELLANEOUS) ×2 IMPLANT
SPONGE GAUZE 4X4 12PLY STER LF (GAUZE/BANDAGES/DRESSINGS) IMPLANT
SPONGE LAP 4X18 X RAY DECT (DISPOSABLE) ×3 IMPLANT
STAPLER VISISTAT 35W (STAPLE) IMPLANT
STRIP CLOSURE SKIN 1/2X4 (GAUZE/BANDAGES/DRESSINGS) IMPLANT
SUT ETHILON 4 0 PS 2 18 (SUTURE) IMPLANT
SUT MNCRL AB 4-0 PS2 18 (SUTURE) ×2 IMPLANT
SUT SILK 2 0 SH (SUTURE) ×1 IMPLANT
SUT VIC AB 2-0 SH 27 (SUTURE)
SUT VIC AB 2-0 SH 27XBRD (SUTURE) IMPLANT
SUT VIC AB 3-0 FS2 27 (SUTURE) IMPLANT
SUT VIC AB 4-0 P-3 18XBRD (SUTURE) IMPLANT
SUT VIC AB 4-0 P3 18 (SUTURE)
SUT VICRYL 3-0 CR8 SH (SUTURE) ×3 IMPLANT
SUT VICRYL 4-0 PS2 18IN ABS (SUTURE) IMPLANT
SYR BULB 3OZ (MISCELLANEOUS) IMPLANT
SYRINGE 10CC LL (SYRINGE) ×3 IMPLANT
TAPE HYPAFIX 4 X10 (GAUZE/BANDAGES/DRESSINGS) IMPLANT
TOWEL OR 17X24 6PK STRL BLUE (TOWEL DISPOSABLE) ×3 IMPLANT
TOWEL OR NON WOVEN STRL DISP B (DISPOSABLE) ×1 IMPLANT
TUBE CONNECTING 20'X1/4 (TUBING)
TUBE CONNECTING 20X1/4 (TUBING) ×1 IMPLANT
YANKAUER SUCT BULB TIP NO VENT (SUCTIONS) ×1 IMPLANT

## 2014-11-08 NOTE — Anesthesia Procedure Notes (Signed)
Procedure Name: LMA Insertion Date/Time: 11/08/2014 7:34 AM Performed by: Melynda Ripple D Pre-anesthesia Checklist: Patient identified, Emergency Drugs available, Suction available and Patient being monitored Patient Re-evaluated:Patient Re-evaluated prior to inductionOxygen Delivery Method: Circle System Utilized Preoxygenation: Pre-oxygenation with 100% oxygen Intubation Type: IV induction Ventilation: Mask ventilation without difficulty LMA: LMA inserted LMA Size: 4.0 Number of attempts: 1 Airway Equipment and Method: Bite block Placement Confirmation: positive ETCO2 Tube secured with: Tape Dental Injury: Teeth and Oropharynx as per pre-operative assessment

## 2014-11-08 NOTE — Anesthesia Postprocedure Evaluation (Signed)
  Anesthesia Post-op Note  Patient: [name]  Procedure(s) Performed: Procedure(s): EXCISION 4 CM MASS RIGHT FLANK (Right)  Patient Location: PACU  Anesthesia Type: General   Level of Consciousness: awake, alert  and oriented  Airway and Oxygen Therapy: Patient Spontanous Breathing  Post-op Pain: mild  Post-op Assessment: Post-op Vital signs reviewed  Post-op Vital Signs: Reviewed  Last Vitals:  Filed Vitals:   11/08/14 0910  BP:   Pulse: 62  Temp: 36.7 C  Resp: 18    Complications: No apparent anesthesia complications

## 2014-11-08 NOTE — Discharge Instructions (Signed)
Stay in the house with family members for 24 hours. Place the ice pack on the wound for 10 minutes at a time, intermittently for 12-24 hours You may shower, starting tomorrow.  No tub baths for 2 weeks The clear plastic superglue patient on your wound will wear off in about 3 weeks ....Marland KitchenMarland Kitchenthis serves as the bandage for your wound..    The specimen that was removed was sent to pathology.  We will call the report to you next week  Call the office if there are any problems with the wound, such as bleeding, intense pain, swelling, or infection  You may start driving a car when you are comfortable.  Do not drive a car if you are taking the narcotic pain medication.  You must be able to wear the seatbelt in the proper position.   Post Anesthesia Home Care Instructions  Activity: Get plenty of rest for the remainder of the day. A responsible adult should stay with you for 24 hours following the procedure.  For the next 24 hours, DO NOT: -Drive a car -Paediatric nurse -Drink alcoholic beverages -Take any medication unless instructed by your physician -Make any legal decisions or sign important papers.  Meals: Start with liquid foods such as gelatin or soup. Progress to regular foods as tolerated. Avoid greasy, spicy, heavy foods. If nausea and/or vomiting occur, drink only clear liquids until the nausea and/or vomiting subsides. Call your physician if vomiting continues.  Special Instructions/Symptoms: Your throat may feel dry or sore from the anesthesia or the breathing tube placed in your throat during surgery. If this causes discomfort, gargle with warm salt water. The discomfort should disappear within 24 hours.  If you had a scopolamine patch placed behind your ear for the management of post- operative nausea and/or vomiting:  1. The medication in the patch is effective for 72 hours, after which it should be removed.  Wrap patch in a tissue and discard in the trash. Wash hands thoroughly  with soap and water. 2. You may remove the patch earlier than 72 hours if you experience unpleasant side effects which may include dry mouth, dizziness or visual disturbances. 3. Avoid touching the patch. Wash your hands with soap and water after contact with the patch.

## 2014-11-08 NOTE — Anesthesia Preprocedure Evaluation (Signed)
Anesthesia Evaluation  Patient identified by MRN, date of birth, ID band Patient awake    Reviewed: Allergy & Precautions, NPO status , Patient's Chart, lab work & pertinent test results  Airway Mallampati: I  TM Distance: >3 FB Neck ROM: Full    Dental  (+) Teeth Intact, Dental Advisory Given   Pulmonary Current Smoker,  breath sounds clear to auscultation        Cardiovascular Rhythm:Regular Rate:Normal     Neuro/Psych    GI/Hepatic   Endo/Other    Renal/GU      Musculoskeletal   Abdominal   Peds  Hematology   Anesthesia Other Findings   Reproductive/Obstetrics                             Anesthesia Physical Anesthesia Plan  ASA: I  Anesthesia Plan: General   Post-op Pain Management:    Induction: Intravenous  Airway Management Planned: LMA  Additional Equipment:   Intra-op Plan:   Post-operative Plan: Extubation in OR  Informed Consent: I have reviewed the patients History and Physical, chart, labs and discussed the procedure including the risks, benefits and alternatives for the proposed anesthesia with the patient or authorized representative who has indicated his/her understanding and acceptance.   Dental advisory given  Plan Discussed with: CRNA, Anesthesiologist and Surgeon  Anesthesia Plan Comments:         Anesthesia Quick Evaluation  

## 2014-11-08 NOTE — Op Note (Signed)
  Patient Name:           [name]   Date of Surgery:        11/08/2014  Pre op Diagnosis:      3 cm soft tissue mass right flank  Post op Diagnosis:    Same  Procedure:                 Excision 3 cm soft tissue mass right flank  Surgeon:                     Edsel Petrin. Dalbert Batman, M.D., FACS  Assistant:                      OR staff  Operative Indications:    This is a 44 year old [place] American male, referred to me by Dr. Boykin Nearing at the [facility] for evaluation and management of a soft tissue mass of the right flank. He states he noted this about a month ago. Never painful. Never red. No drainage. No pain and no change in size. Apparently seen in the ED and ultrasound was done which showed no abscess. Then he was seen at [facility] and [facility].  Then sent to me.. 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 He was given the option of doing nothing or having it excised. He very strongly wanted this area excised.  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  This was present in the right flank overlying the most inferior portion of the rib cage at the midaxillary line.  This was adherent to the deep muscle fascia and I took a little bit of the fascia with the tumor.  There did not appear to be any subfascial component.  Specimen was sent for pathology.  Procedure in Detail:          Following the induction of general LMA anesthesia the patient was placed in a left lateral decubitus position with appropriate padding.  The right flank was prepped and draped in a sterile fashion.  Intravenous antibiotic given.  Surgical timeout was performed.  0.5% Marcaine with epinephrine was used as local infiltration and aesthetic.    A transverse longitudinal incision was made.  Dissection was carried down into the deep subcutaneous  tissue and the mass was encountered, dissected free from surrounding tissues and sent to the pathology lab.  Hemostasis was excellent.  The wound was irrigated.  The deeper tissues were closed with interrupted interrupted 3-0 Vicryl sutures and skin closed with a running subcuticular 4-0 Monocryl and Dermabond.  The patient tolerated the procedure well was taken to PACU in stable condition.  EBL 5 mL or less.  Counts correct.  Complications none.     Edsel Petrin. Dalbert Batman, M.D., FACS General and Minimally Invasive Surgery Breast and Colorectal Surgery  11/08/2014 7:57 AM

## 2014-11-08 NOTE — Interval H&P Note (Signed)
History and Physical Interval Note:  11/08/2014 6:39 AM  [name]  has presented today for surgery, with the diagnosis of 4 cm soft tissue mass right flank  The various methods of treatment have been discussed with the patient and family. After consideration of risks, benefits and other options for treatment, the patient has consented to  Procedure(s): EXCISION 4 CM MASS RIGHT FLANK (Right) as a surgical intervention .  The patient's history has been reviewed, patient examined, no change in status, stable for surgery.  I have reviewed the patient's chart and labs.  Questions were answered to the patient's satisfaction.     [name]

## 2014-11-08 NOTE — Transfer of Care (Signed)
Immediate Anesthesia Transfer of Care Note  Patient: [name]  Procedure(s) Performed: Procedure(s): EXCISION 4 CM MASS RIGHT FLANK (Right)  Patient Location: PACU  Anesthesia Type:General  Level of Consciousness: sedated  Airway & Oxygen Therapy: Patient Spontanous Breathing and Patient connected to face mask oxygen  Post-op Assessment: Report given to RN and Post -op Vital signs reviewed and stable  Post vital signs: Reviewed and stable  Last Vitals:  Filed Vitals:   11/08/14 0618  BP: 138/85  Pulse: 60  Temp: 36.5 C  Resp: 18    Complications: No apparent anesthesia complications

## 2014-11-11 ENCOUNTER — Encounter (HOSPITAL_BASED_OUTPATIENT_CLINIC_OR_DEPARTMENT_OTHER): Payer: Self-pay | Admitting: General Surgery

## 2014-11-11 MED FILL — Metronidazole in NaCl 0.79% IV Soln 500 MG/100ML: INTRAVENOUS | Qty: 100 | Status: AC

## 2014-11-11 NOTE — Progress Notes (Signed)
Quick Note:  Inform patient of Pathology report,. Tell him this was a benign lipoma. Good news. hmi  ______

## 2014-11-12 DIAGNOSIS — G5601 Carpal tunnel syndrome, right upper limb: Secondary | ICD-10-CM | POA: Diagnosis not present

## 2014-12-26 DIAGNOSIS — Z4789 Encounter for other orthopedic aftercare: Secondary | ICD-10-CM | POA: Diagnosis not present

## 2014-12-26 DIAGNOSIS — G5601 Carpal tunnel syndrome, right upper limb: Secondary | ICD-10-CM | POA: Diagnosis not present

## 2015-01-13 ENCOUNTER — Emergency Department (INDEPENDENT_AMBULATORY_CARE_PROVIDER_SITE_OTHER)
Admission: EM | Admit: 2015-01-13 | Discharge: 2015-01-13 | Disposition: A | Discharge status: Home / Self Care | Payer: Medicare Other | Source: Home / Self Care | Attending: Family Medicine | Admitting: Family Medicine

## 2015-01-13 ENCOUNTER — Encounter (HOSPITAL_COMMUNITY): Payer: Self-pay | Admitting: *Deleted

## 2015-01-13 DIAGNOSIS — M25562 Pain in left knee: Secondary | ICD-10-CM

## 2015-01-13 MED ORDER — DICLOFENAC SODIUM 1 % TD GEL: 4.0000 g | Freq: Four times a day (QID) | TRANSDERMAL | Status: DC
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
## 2015-01-13 NOTE — ED Notes (Signed)
Pt  Reports  He  Was  Bending  Over  Working on a  Musician  And  He  Felt  Something     Biting  Him on his  l  Knee        He  Reports  Itching  Swelling and  Some  Pain       This  Happened  About  30  mins  Ago      no  Angioedema   Speaking in  Complete  sentances

## 2015-01-13 NOTE — Discharge Instructions (Signed)
Use ice and medicine as needed.

## 2015-03-07 DIAGNOSIS — Z4789 Encounter for other orthopedic aftercare: Secondary | ICD-10-CM | POA: Diagnosis not present

## 2015-03-07 DIAGNOSIS — G5601 Carpal tunnel syndrome, right upper limb: Secondary | ICD-10-CM | POA: Diagnosis not present

## 2015-05-05 ENCOUNTER — Ambulatory Visit: Payer: Medicare Other | Admitting: Family Medicine

## 2015-05-12 DIAGNOSIS — Z4789 Encounter for other orthopedic aftercare: Secondary | ICD-10-CM | POA: Diagnosis not present

## 2015-05-12 DIAGNOSIS — R208 Other disturbances of skin sensation: Secondary | ICD-10-CM | POA: Diagnosis not present

## 2015-05-12 DIAGNOSIS — S63522D Sprain of radiocarpal joint of left wrist, subsequent encounter: Secondary | ICD-10-CM | POA: Diagnosis not present

## 2015-05-12 DIAGNOSIS — G5602 Carpal tunnel syndrome, left upper limb: Secondary | ICD-10-CM | POA: Diagnosis not present

## 2015-05-16 ENCOUNTER — Ambulatory Visit: Payer: Medicare Other | Admitting: Family Medicine

## 2015-05-22 ENCOUNTER — Ambulatory Visit: Payer: Medicare Other | Admitting: Family Medicine

## 2015-06-09 DIAGNOSIS — G5602 Carpal tunnel syndrome, left upper limb: Secondary | ICD-10-CM | POA: Diagnosis not present

## 2015-08-12 DIAGNOSIS — G5601 Carpal tunnel syndrome, right upper limb: Secondary | ICD-10-CM | POA: Diagnosis not present

## 2015-09-18 DIAGNOSIS — S63522D Sprain of radiocarpal joint of left wrist, subsequent encounter: Secondary | ICD-10-CM | POA: Diagnosis not present

## 2015-09-18 DIAGNOSIS — G5602 Carpal tunnel syndrome, left upper limb: Secondary | ICD-10-CM | POA: Diagnosis not present

## 2015-09-18 DIAGNOSIS — Z4789 Encounter for other orthopedic aftercare: Secondary | ICD-10-CM | POA: Diagnosis not present

## 2015-09-18 DIAGNOSIS — R208 Other disturbances of skin sensation: Secondary | ICD-10-CM | POA: Diagnosis not present

## 2015-09-22 DIAGNOSIS — G5602 Carpal tunnel syndrome, left upper limb: Secondary | ICD-10-CM | POA: Diagnosis not present

## 2015-10-03 DIAGNOSIS — G5601 Carpal tunnel syndrome, right upper limb: Secondary | ICD-10-CM | POA: Diagnosis not present

## 2015-10-03 DIAGNOSIS — G5602 Carpal tunnel syndrome, left upper limb: Secondary | ICD-10-CM | POA: Diagnosis not present

## 2015-10-03 DIAGNOSIS — S63522D Sprain of radiocarpal joint of left wrist, subsequent encounter: Secondary | ICD-10-CM | POA: Diagnosis not present

## 2015-10-03 DIAGNOSIS — R200 Anesthesia of skin: Secondary | ICD-10-CM | POA: Diagnosis not present

## 2015-10-07 ENCOUNTER — Ambulatory Visit: Payer: Medicare Other | Admitting: Family Medicine

## 2015-10-08 ENCOUNTER — Ambulatory Visit (INDEPENDENT_AMBULATORY_CARE_PROVIDER_SITE_OTHER): Payer: Medicare Other | Admitting: Neurology

## 2015-10-08 ENCOUNTER — Encounter: Payer: Self-pay | Admitting: Neurology

## 2015-10-08 VITALS — BP 131/76 | HR 64 | Ht 64.75 in | Wt 144.0 lb

## 2015-10-08 DIAGNOSIS — M792 Neuralgia and neuritis, unspecified: Secondary | ICD-10-CM

## 2015-10-08 DIAGNOSIS — Z789 Other specified health status: Secondary | ICD-10-CM | POA: Diagnosis not present

## 2015-10-08 DIAGNOSIS — M25532 Pain in left wrist: Secondary | ICD-10-CM | POA: Diagnosis not present

## 2015-10-08 DIAGNOSIS — M542 Cervicalgia: Secondary | ICD-10-CM | POA: Diagnosis not present

## 2015-10-08 DIAGNOSIS — M5410 Radiculopathy, site unspecified: Secondary | ICD-10-CM

## 2015-10-08 DIAGNOSIS — R29818 Other symptoms and signs involving the nervous system: Secondary | ICD-10-CM

## 2015-10-08 DIAGNOSIS — G5603 Carpal tunnel syndrome, bilateral upper limbs: Secondary | ICD-10-CM | POA: Diagnosis not present

## 2015-10-08 DIAGNOSIS — M5010 Cervical disc disorder with radiculopathy, unspecified cervical region: Secondary | ICD-10-CM

## 2015-10-08 DIAGNOSIS — R29898 Other symptoms and signs involving the musculoskeletal system: Secondary | ICD-10-CM

## 2015-10-08 MED ORDER — GABAPENTIN 300 MG PO CAPS: 300.0000 mg | ORAL_CAPSULE | Freq: Three times a day (TID) | ORAL | 11 refills | Status: DC
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
## 2015-10-08 NOTE — Patient Instructions (Addendum)
Remember to drink plenty of fluid, eat healthy meals and do not skip any meals. Try to eat protein with a every meal and eat a healthy snack such as fruit or nuts in between meals. Try to keep a regular sleep-wake schedule and try to exercise daily, particularly in the form of walking, 20-30 minutes a day, if you can.   As far as your medications are concerned, I would like to suggest: Gabapentin 300mg  three times a day  As far as diagnostic testing: Physical therapy, Occupational therapy, MRI of the cervical spine  I would like to see you back after physical therapy if needed, sooner if we need to. Please call us with any interim questions, concerns, problems, updates or refill requests.   Our phone number is [phone]. We also have an after hours call service for urgent matters and there is a physician on-call for urgent questions. For any emergencies you know to call 911 or go to the nearest emergency room  Gabapentin capsules or tablets What is this medicine? GABAPENTIN (GA ba pen tin) is used to control partial seizures in adults with epilepsy. It is also used to treat certain types of nerve pain. This medicine may be used for other purposes; ask your health care provider or pharmacist if you have questions. What should I tell my health care provider before I take this medicine? They need to know if you have any of these conditions: -kidney disease -suicidal thoughts, plans, or attempt; a previous suicide attempt by you or a family member -an unusual or allergic reaction to gabapentin, other medicines, foods, dyes, or preservatives -pregnant or trying to get pregnant -breast-feeding How should I use this medicine? Take this medicine by mouth with a glass of water. Follow the directions on the prescription label. You can take it with or without food. If it upsets your stomach, take it with food.Take your medicine at regular intervals. Do not take it more often than directed. Do not stop  taking except on your doctor's advice. If you are directed to break the 600 or 800 mg tablets in half as part of your dose, the extra half tablet should be used for the next dose. If you have not used the extra half tablet within 28 days, it should be thrown away. A special MedGuide will be given to you by the pharmacist with each prescription and refill. Be sure to read this information carefully each time. Talk to your pediatrician regarding the use of this medicine in children. Special care may be needed. Overdosage: If you think you have taken too much of this medicine contact a poison control center or emergency room at once. NOTE: This medicine is only for you. Do not share this medicine with others. What if I miss a dose? If you miss a dose, take it as soon as you can. If it is almost time for your next dose, take only that dose. Do not take double or extra doses. What may interact with this medicine? Do not take this medicine with any of the following medications: -other gabapentin products This medicine may also interact with the following medications: -alcohol -antacids -antihistamines for allergy, cough and cold -certain medicines for anxiety or sleep -certain medicines for depression or psychotic disturbances -homatropine; hydrocodone -naproxen -narcotic medicines (opiates) for pain -phenothiazines like chlorpromazine, mesoridazine, prochlorperazine, thioridazine This list may not describe all possible interactions. Give your health care provider a list of all the medicines, herbs, non-prescription drugs, or dietary supplements you  use. Also tell them if you smoke, drink alcohol, or use illegal drugs. Some items may interact with your medicine. What should I watch for while using this medicine? Visit your doctor or health care professional for regular checks on your progress. You may want to keep a record at home of how you feel your condition is responding to treatment. You may  want to share this information with your doctor or health care professional at each visit. You should contact your doctor or health care professional if your seizures get worse or if you have any new types of seizures. Do not stop taking this medicine or any of your seizure medicines unless instructed by your doctor or health care professional. Stopping your medicine suddenly can increase your seizures or their severity. Wear a medical identification bracelet or chain if you are taking this medicine for seizures, and carry a card that lists all your medications. You may get drowsy, dizzy, or have blurred vision. Do not drive, use machinery, or do anything that needs mental alertness until you know how this medicine affects you. To reduce dizzy or fainting spells, do not sit or stand up quickly, especially if you are an older patient. Alcohol can increase drowsiness and dizziness. Avoid alcoholic drinks. Your mouth may get dry. Chewing sugarless gum or sucking hard candy, and drinking plenty of water will help. The use of this medicine may increase the chance of suicidal thoughts or actions. Pay special attention to how you are responding while on this medicine. Any worsening of mood, or thoughts of suicide or dying should be reported to your health care professional right away. Women who become pregnant while using this medicine may enroll in the [place] Pregnancy Registry by calling [phone]. This registry collects information about the safety of antiepileptic drug use during pregnancy. What side effects may I notice from receiving this medicine? Side effects that you should report to your doctor or health care professional as soon as possible: -allergic reactions like skin rash, itching or hives, swelling of the face, lips, or tongue -worsening of mood, thoughts or actions of suicide or dying Side effects that usually do not require medical attention (report to your doctor  or health care professional if they continue or are bothersome): -constipation -difficulty walking or controlling muscle movements -dizziness -nausea -slurred speech -tiredness -tremors -weight gain This list may not describe all possible side effects. Call your doctor for medical advice about side effects. You may report side effects to FDA at 1-800-FDA-1088. Where should I keep my medicine? Keep out of reach of children. This medicine may cause accidental overdose and death if it taken by other adults, children, or pets. Mix any unused medicine with a substance like cat litter or coffee grounds. Then throw the medicine away in a sealed container like a sealed bag or a coffee can with a lid. Do not use the medicine after the expiration date. Store at room temperature between 15 and 30 degrees C (59 and 86 degrees F). NOTE: This sheet is a summary. It may not cover all possible information. If you have questions about this medicine, talk to your doctor, pharmacist, or health care provider.    2016, Elsevier/Gold Standard. (2013-04-27 15:26:50)

## 2015-10-13 ENCOUNTER — Encounter: Payer: Self-pay | Admitting: Neurology

## 2015-10-13 DIAGNOSIS — M5410 Radiculopathy, site unspecified: Secondary | ICD-10-CM | POA: Insufficient documentation

## 2015-10-13 DIAGNOSIS — M25532 Pain in left wrist: Secondary | ICD-10-CM | POA: Insufficient documentation

## 2015-10-13 DIAGNOSIS — M542 Cervicalgia: Secondary | ICD-10-CM | POA: Insufficient documentation

## 2015-10-13 DIAGNOSIS — G5600 Carpal tunnel syndrome, unspecified upper limb: Secondary | ICD-10-CM | POA: Insufficient documentation

## 2015-10-23 ENCOUNTER — Ambulatory Visit: Payer: Medicare Other | Attending: Neurology | Admitting: Occupational Therapy

## 2015-10-23 ENCOUNTER — Encounter: Payer: Self-pay | Admitting: Occupational Therapy

## 2015-10-23 DIAGNOSIS — M79602 Pain in left arm: Secondary | ICD-10-CM | POA: Diagnosis not present

## 2015-10-23 DIAGNOSIS — R202 Paresthesia of skin: Secondary | ICD-10-CM | POA: Diagnosis not present

## 2015-10-23 DIAGNOSIS — R278 Other lack of coordination: Secondary | ICD-10-CM | POA: Diagnosis not present

## 2015-10-23 DIAGNOSIS — M542 Cervicalgia: Secondary | ICD-10-CM | POA: Insufficient documentation

## 2015-10-23 DIAGNOSIS — M79642 Pain in left hand: Secondary | ICD-10-CM | POA: Diagnosis not present

## 2015-10-23 DIAGNOSIS — M6281 Muscle weakness (generalized): Secondary | ICD-10-CM | POA: Diagnosis not present

## 2015-10-23 DIAGNOSIS — R293 Abnormal posture: Secondary | ICD-10-CM | POA: Diagnosis not present

## 2015-10-23 DIAGNOSIS — R208 Other disturbances of skin sensation: Secondary | ICD-10-CM | POA: Diagnosis not present
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
## 2015-10-27 ENCOUNTER — Encounter: Payer: Self-pay | Admitting: Physical Therapy

## 2015-10-27 ENCOUNTER — Ambulatory Visit: Payer: Medicare Other | Admitting: Physical Therapy

## 2015-10-27 DIAGNOSIS — M79602 Pain in left arm: Secondary | ICD-10-CM | POA: Diagnosis not present

## 2015-10-27 DIAGNOSIS — R293 Abnormal posture: Secondary | ICD-10-CM

## 2015-10-27 DIAGNOSIS — M79642 Pain in left hand: Secondary | ICD-10-CM | POA: Diagnosis not present

## 2015-10-27 DIAGNOSIS — M542 Cervicalgia: Secondary | ICD-10-CM

## 2015-10-27 DIAGNOSIS — R202 Paresthesia of skin: Secondary | ICD-10-CM | POA: Diagnosis not present

## 2015-10-27 DIAGNOSIS — M6281 Muscle weakness (generalized): Secondary | ICD-10-CM | POA: Diagnosis not present

## 2015-10-27 DIAGNOSIS — R208 Other disturbances of skin sensation: Secondary | ICD-10-CM

## 2015-10-27 DIAGNOSIS — R278 Other lack of coordination: Secondary | ICD-10-CM | POA: Diagnosis not present

## 2015-10-27 NOTE — Patient Instructions (Signed)
 -   Put towel at base of head (as pictured). - Tuck chin and while gently pushing head into towel. You should feel a gentle stretch in the front of neck and deep in neck. - Hold 10 seconds. Relax for 10 seconds. Repeat. Perform 6-8 repetitions, _1-2__ times per day.    - Bring RIGHT ear to RIGHT shoulder.  - RIGHT hand on top of head providing gentle pressure.  - LEFT hand behind back. *You should feel a gentle stretch in the top of your RIGHT shoulder/lower neck. - Hold for 30 seconds. Relax. Repeat on LEFT side. Perform 4 repetitions. Do this stretch __1-2__ times per day on each side.
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
## 2015-10-28 ENCOUNTER — Ambulatory Visit: Payer: Medicare Other | Admitting: Physical Therapy

## 2015-10-28 DIAGNOSIS — R208 Other disturbances of skin sensation: Secondary | ICD-10-CM

## 2015-10-28 DIAGNOSIS — R293 Abnormal posture: Secondary | ICD-10-CM

## 2015-10-28 DIAGNOSIS — M542 Cervicalgia: Secondary | ICD-10-CM

## 2015-10-28 DIAGNOSIS — M79642 Pain in left hand: Secondary | ICD-10-CM | POA: Diagnosis not present

## 2015-10-28 DIAGNOSIS — R202 Paresthesia of skin: Secondary | ICD-10-CM | POA: Diagnosis not present

## 2015-10-28 DIAGNOSIS — M6281 Muscle weakness (generalized): Secondary | ICD-10-CM | POA: Diagnosis not present

## 2015-10-28 DIAGNOSIS — R278 Other lack of coordination: Secondary | ICD-10-CM | POA: Diagnosis not present

## 2015-10-28 DIAGNOSIS — M79602 Pain in left arm: Secondary | ICD-10-CM | POA: Diagnosis not present
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
## 2015-10-28 NOTE — Patient Instructions (Signed)
Editor: Stefano Gaul, PT (Physical Therapist)      - Put towel at base of head (as pictured). - Tuck chin and while gently pushing head into towel. You should feel a gentle stretch in the front of neck and deep in neck. - Hold 10 seconds. Relax for 10 seconds. Repeat. Perform 6-8 repetitions, _1-2__ times per day.    - Bring RIGHT ear to RIGHT shoulder.  - RIGHT hand on top of head providing gentle pressure.  - LEFT hand behind back. *You should feel a gentle stretch in the top of your RIGHT shoulder/lower neck. - Hold for 30 seconds. Relax. Repeat on LEFT side. Perform 4 repetitions. Do this stretch __1-2__ times per day on each side.              - Both hands to your side (palms flat on seat) - Tilt your head to the side (ear to shoulder) - Tuck your chin - You should feel a stretch deep in your neck (opposite side from that you're stretching) - Hold for 30 seconds, _3-4__ times per day.

## 2015-10-29 ENCOUNTER — Encounter: Payer: Self-pay | Admitting: Physical Therapy

## 2015-10-29 ENCOUNTER — Ambulatory Visit: Payer: Medicare Other | Admitting: Physical Therapy

## 2015-10-29 DIAGNOSIS — R293 Abnormal posture: Secondary | ICD-10-CM

## 2015-10-29 DIAGNOSIS — R202 Paresthesia of skin: Secondary | ICD-10-CM | POA: Diagnosis not present

## 2015-10-29 DIAGNOSIS — R278 Other lack of coordination: Secondary | ICD-10-CM | POA: Diagnosis not present

## 2015-10-29 DIAGNOSIS — M542 Cervicalgia: Secondary | ICD-10-CM

## 2015-10-29 DIAGNOSIS — M79642 Pain in left hand: Secondary | ICD-10-CM | POA: Diagnosis not present

## 2015-10-29 DIAGNOSIS — M79602 Pain in left arm: Secondary | ICD-10-CM | POA: Diagnosis not present

## 2015-10-29 DIAGNOSIS — M6281 Muscle weakness (generalized): Secondary | ICD-10-CM | POA: Diagnosis not present

## 2015-10-29 DIAGNOSIS — R208 Other disturbances of skin sensation: Secondary | ICD-10-CM

## 2015-10-30 ENCOUNTER — Ambulatory Visit: Payer: Medicare Other | Admitting: Occupational Therapy

## 2015-10-30 DIAGNOSIS — M79602 Pain in left arm: Secondary | ICD-10-CM

## 2015-10-30 DIAGNOSIS — M6281 Muscle weakness (generalized): Secondary | ICD-10-CM | POA: Diagnosis not present

## 2015-10-30 DIAGNOSIS — M542 Cervicalgia: Secondary | ICD-10-CM | POA: Diagnosis not present

## 2015-10-30 DIAGNOSIS — R278 Other lack of coordination: Secondary | ICD-10-CM | POA: Diagnosis not present

## 2015-10-30 DIAGNOSIS — M79642 Pain in left hand: Secondary | ICD-10-CM | POA: Diagnosis not present

## 2015-10-30 DIAGNOSIS — R202 Paresthesia of skin: Secondary | ICD-10-CM
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
## 2015-11-05 ENCOUNTER — Telehealth: Payer: Self-pay | Admitting: *Deleted

## 2015-11-05 ENCOUNTER — Telehealth: Payer: Self-pay | Admitting: Neurology

## 2015-11-05 NOTE — Addendum Note (Signed)
Addended by: Sarina Ill B on: 11/05/2015 09:46 AM   Modules accepted: Orders

## 2015-11-05 NOTE — Telephone Encounter (Signed)
Dr Jaynee Eagles putting in order again for MRI cervical spine. Let [name] know so she can contact patient about scheduling.

## 2015-11-05 NOTE — Telephone Encounter (Signed)
Called and spoke with the patient regarding his MRI. I gave him the number to [facility] and he stated that he would call to schedule.

## 2015-11-06 ENCOUNTER — Ambulatory Visit: Payer: Medicare Other | Admitting: Occupational Therapy

## 2015-11-06 DIAGNOSIS — M79642 Pain in left hand: Secondary | ICD-10-CM

## 2015-11-06 DIAGNOSIS — M6281 Muscle weakness (generalized): Secondary | ICD-10-CM | POA: Diagnosis not present

## 2015-11-06 DIAGNOSIS — R278 Other lack of coordination: Secondary | ICD-10-CM | POA: Diagnosis not present

## 2015-11-06 DIAGNOSIS — M79602 Pain in left arm: Secondary | ICD-10-CM

## 2015-11-06 DIAGNOSIS — M542 Cervicalgia: Secondary | ICD-10-CM | POA: Diagnosis not present

## 2015-11-06 DIAGNOSIS — R202 Paresthesia of skin: Secondary | ICD-10-CM | POA: Diagnosis not present

## 2015-11-06 DIAGNOSIS — R208 Other disturbances of skin sensation: Secondary | ICD-10-CM
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
## 2015-11-11 ENCOUNTER — Ambulatory Visit: Payer: Medicare Other | Admitting: Physical Therapy

## 2015-11-11 ENCOUNTER — Ambulatory Visit: Payer: Medicare Other | Admitting: Occupational Therapy

## 2015-11-11 DIAGNOSIS — M79642 Pain in left hand: Secondary | ICD-10-CM | POA: Diagnosis not present

## 2015-11-11 DIAGNOSIS — M79602 Pain in left arm: Secondary | ICD-10-CM | POA: Diagnosis not present

## 2015-11-11 DIAGNOSIS — M542 Cervicalgia: Secondary | ICD-10-CM | POA: Diagnosis not present

## 2015-11-11 DIAGNOSIS — R278 Other lack of coordination: Secondary | ICD-10-CM

## 2015-11-11 DIAGNOSIS — M6281 Muscle weakness (generalized): Secondary | ICD-10-CM

## 2015-11-11 DIAGNOSIS — R293 Abnormal posture: Secondary | ICD-10-CM

## 2015-11-11 DIAGNOSIS — R202 Paresthesia of skin: Secondary | ICD-10-CM
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
## 2015-11-13 ENCOUNTER — Encounter: Payer: Self-pay | Admitting: Physical Therapy

## 2015-11-13 ENCOUNTER — Ambulatory Visit: Payer: Medicare Other | Admitting: Physical Therapy

## 2015-11-13 DIAGNOSIS — M542 Cervicalgia: Secondary | ICD-10-CM

## 2015-11-13 DIAGNOSIS — M79642 Pain in left hand: Secondary | ICD-10-CM | POA: Diagnosis not present

## 2015-11-13 DIAGNOSIS — R208 Other disturbances of skin sensation: Secondary | ICD-10-CM

## 2015-11-13 DIAGNOSIS — M6281 Muscle weakness (generalized): Secondary | ICD-10-CM | POA: Diagnosis not present

## 2015-11-13 DIAGNOSIS — M79602 Pain in left arm: Secondary | ICD-10-CM | POA: Diagnosis not present

## 2015-11-13 DIAGNOSIS — R202 Paresthesia of skin: Secondary | ICD-10-CM | POA: Diagnosis not present

## 2015-11-13 DIAGNOSIS — R278 Other lack of coordination: Secondary | ICD-10-CM | POA: Diagnosis not present

## 2015-11-13 DIAGNOSIS — R293 Abnormal posture: Secondary | ICD-10-CM
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
## 2015-11-14 ENCOUNTER — Inpatient Hospital Stay: Admission: RE | Admit: 2015-11-14 | Payer: Medicare Other | Source: Ambulatory Visit

## 2015-11-18 ENCOUNTER — Ambulatory Visit: Payer: Medicare Other | Attending: Neurology | Admitting: Occupational Therapy

## 2015-11-18 DIAGNOSIS — M79602 Pain in left arm: Secondary | ICD-10-CM | POA: Insufficient documentation

## 2015-11-18 DIAGNOSIS — R278 Other lack of coordination: Secondary | ICD-10-CM | POA: Insufficient documentation

## 2015-11-18 DIAGNOSIS — R293 Abnormal posture: Secondary | ICD-10-CM

## 2015-11-18 DIAGNOSIS — M6281 Muscle weakness (generalized): Secondary | ICD-10-CM | POA: Insufficient documentation

## 2015-11-18 DIAGNOSIS — M542 Cervicalgia: Secondary | ICD-10-CM | POA: Diagnosis not present

## 2015-11-18 DIAGNOSIS — R208 Other disturbances of skin sensation: Secondary | ICD-10-CM | POA: Diagnosis not present

## 2015-11-18 DIAGNOSIS — M79642 Pain in left hand: Secondary | ICD-10-CM | POA: Diagnosis not present
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
## 2015-11-18 NOTE — Patient Instructions (Signed)
  Pain management strategies:   Use of hot packs as needed  Use of braces  Massage   Stretches/exercises (see exercise sheet)   Avoiding prolonged gripping (stretches at stop lights with driving, taking rest breaks and stretching with vacuuming and/or mowing - may have to break up task over several days)   Compensatory strategies for tasks and adaptive equipment as needed (making handles of items bigger, foam on pen, keeping wrist neutral as much as possible, holding items differently)  Avoid improper positioning of neck and trunk. Keep good posture and perform gentle neck exercises provided by P.T.    SAFETY CONSIDERATIONS:   Always test temperature of water with Rt hand or part of body that is not affected/numb  Do NOT lift or carry anything sharp, heavy, breakable, or hot with Lt hand  When carrying with hands or using hands, always use vision (look at hand when carrying objects or when cutting vegetables, etc)

## 2015-11-19 ENCOUNTER — Ambulatory Visit: Payer: Medicare Other | Admitting: Physical Therapy

## 2015-11-19 ENCOUNTER — Telehealth: Payer: Self-pay | Admitting: Physical Therapy

## 2015-11-19 NOTE — Telephone Encounter (Signed)
Called patient due to missed PT appointment this morning at 10:15 am. Spoke with patient, who reports inability to attend due to court today. Patient verbalized plan to attend OT/PT appointments at 8 and 8:45 am tomorrow morning.  Billie Ruddy, PT, DPT [facility] [address] [place], [place], [address] Phone: [phone]   Fax:  [phone] 11/19/15, 10:28 AM

## 2015-11-20 ENCOUNTER — Ambulatory Visit: Payer: Medicare Other | Admitting: Physical Therapy

## 2015-11-20 ENCOUNTER — Ambulatory Visit: Payer: Medicare Other | Admitting: Occupational Therapy

## 2015-11-20 DIAGNOSIS — R208 Other disturbances of skin sensation: Secondary | ICD-10-CM | POA: Diagnosis not present

## 2015-11-20 DIAGNOSIS — M79602 Pain in left arm: Secondary | ICD-10-CM | POA: Diagnosis not present

## 2015-11-20 DIAGNOSIS — M6281 Muscle weakness (generalized): Secondary | ICD-10-CM

## 2015-11-20 DIAGNOSIS — R278 Other lack of coordination: Secondary | ICD-10-CM

## 2015-11-20 DIAGNOSIS — M79642 Pain in left hand: Secondary | ICD-10-CM | POA: Diagnosis not present

## 2015-11-20 DIAGNOSIS — R293 Abnormal posture: Secondary | ICD-10-CM | POA: Diagnosis not present
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
## 2015-11-20 NOTE — Patient Instructions (Signed)
   1. Grip Strengthening (Resistive Putty)   Squeeze putty using thumb and all fingers. Repeat _15___ times. Do __2__ sessions per day.   2. Roll putty into tube on table and pinch between each index and thumb, then long finger and thumb x 10 reps each. Do 2 sessions per day
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
## 2015-11-22 ENCOUNTER — Ambulatory Visit
Admission: RE | Admit: 2015-11-22 | Discharge: 2015-11-22 | Disposition: A | Discharge status: Home / Self Care | Payer: Medicare Other | Source: Ambulatory Visit | Attending: Neurology | Admitting: Neurology

## 2015-11-22 DIAGNOSIS — M5410 Radiculopathy, site unspecified: Secondary | ICD-10-CM

## 2015-11-22 DIAGNOSIS — M542 Cervicalgia: Secondary | ICD-10-CM | POA: Diagnosis not present

## 2015-11-22 DIAGNOSIS — M25532 Pain in left wrist: Secondary | ICD-10-CM

## 2015-11-22 DIAGNOSIS — R29818 Other symptoms and signs involving the nervous system: Secondary | ICD-10-CM

## 2015-11-22 DIAGNOSIS — R29898 Other symptoms and signs involving the musculoskeletal system: Secondary | ICD-10-CM

## 2015-11-22 DIAGNOSIS — M5010 Cervical disc disorder with radiculopathy, unspecified cervical region: Secondary | ICD-10-CM

## 2015-11-24 ENCOUNTER — Telehealth: Payer: Self-pay | Admitting: *Deleted

## 2015-11-24 NOTE — Telephone Encounter (Signed)
-----   Message from Melvenia Beam, MD sent at 11/23/2015  2:53 PM EDT ----- Mri of the cervical spine essentially normal. Some very minimal arthritis but no pinched nerves, the cord looks normal, no cause for his symptoms

## 2015-11-24 NOTE — Telephone Encounter (Signed)
Called and spoke to patient about MRI results per Dr Jaynee Eagles note. Pt verbalized understanding and has no further questions at this time.

## 2015-11-25 ENCOUNTER — Ambulatory Visit: Payer: Medicare Other | Admitting: Occupational Therapy

## 2015-11-27 ENCOUNTER — Ambulatory Visit: Payer: Medicare Other | Admitting: Occupational Therapy

## 2015-12-02 ENCOUNTER — Ambulatory Visit: Payer: Medicare Other | Admitting: Occupational Therapy

## 2015-12-04 ENCOUNTER — Encounter: Payer: Medicare Other | Admitting: Occupational Therapy

## 2015-12-04 ENCOUNTER — Ambulatory Visit: Payer: Medicare Other | Admitting: Occupational Therapy

## 2015-12-10 ENCOUNTER — Ambulatory Visit: Payer: Medicare Other | Admitting: Occupational Therapy

## 2015-12-10 ENCOUNTER — Encounter: Payer: Self-pay | Admitting: Occupational Therapy

## 2015-12-10 DIAGNOSIS — M6281 Muscle weakness (generalized): Secondary | ICD-10-CM | POA: Diagnosis not present

## 2015-12-10 DIAGNOSIS — R278 Other lack of coordination: Secondary | ICD-10-CM

## 2015-12-10 DIAGNOSIS — R208 Other disturbances of skin sensation: Secondary | ICD-10-CM

## 2015-12-10 DIAGNOSIS — M79602 Pain in left arm: Secondary | ICD-10-CM | POA: Diagnosis not present

## 2015-12-10 DIAGNOSIS — M79642 Pain in left hand: Secondary | ICD-10-CM | POA: Diagnosis not present

## 2015-12-10 DIAGNOSIS — R293 Abnormal posture: Secondary | ICD-10-CM | POA: Diagnosis not present
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
## 2016-04-16 ENCOUNTER — Ambulatory Visit: Payer: Medicare Other | Attending: Physician Assistant | Admitting: Physician Assistant

## 2016-04-16 ENCOUNTER — Encounter: Payer: Self-pay | Admitting: Physician Assistant

## 2016-04-16 VITALS — BP 154/87 | HR 83 | Temp 98.2°F | Resp 16 | Wt 150.0 lb

## 2016-04-16 DIAGNOSIS — R202 Paresthesia of skin: Secondary | ICD-10-CM

## 2016-04-16 DIAGNOSIS — M542 Cervicalgia: Secondary | ICD-10-CM | POA: Diagnosis not present

## 2016-04-16 DIAGNOSIS — R209 Unspecified disturbances of skin sensation: Secondary | ICD-10-CM | POA: Diagnosis not present

## 2016-04-16 DIAGNOSIS — M5412 Radiculopathy, cervical region: Secondary | ICD-10-CM

## 2016-04-16 MED ORDER — GABAPENTIN 300 MG PO CAPS: 300.0000 mg | ORAL_CAPSULE | Freq: Three times a day (TID) | ORAL | 2 refills | Status: DC

## 2016-04-16 MED ORDER — CYCLOBENZAPRINE HCL 10 MG PO TABS: 10.0000 mg | ORAL_TABLET | Freq: Three times a day (TID) | ORAL | 0 refills | Status: DC | PRN

## 2016-04-16 MED ORDER — CYCLOBENZAPRINE HCL 10 MG PO TABS: 10.0000 mg | ORAL_TABLET | Freq: Three times a day (TID) | ORAL | 0 refills | Status: AC | PRN

## 2016-04-16 MED ORDER — GABAPENTIN 300 MG PO CAPS: 300.0000 mg | ORAL_CAPSULE | Freq: Three times a day (TID) | ORAL | 0 refills | Status: DC

## 2016-04-16 NOTE — Progress Notes (Signed)
Patient ID: [name], male   DOB: 1970/10/23, 46 y.o.   MRN: [id number]    Subjective:  Patient ID: [name], male    DOB: 04/22/1970  Age: 46 y.o. MRN: [id number]  CC: chronic pain and radiculopathy  HPI [name] is a 46 y.o. male with a PMH of   Past Medical History:  Diagnosis Date  . CTS (carpal tunnel syndrome)   . Headache   . Hypokalemia    that presents with chronic left sided cervical radiculopathy. Feels pain and burning radiate from the left upper trapezius to his left hand. Says he also feels as the skin on his left forearm is on fire when wind blows on it. Feels weakness in his left grip when holding items "for about two minutes". Has previously taken Vicodin and gabapentin but has run out of these medications. Was also attending neuro-rehabilitation which he reports drastically reduced his symptoms. Would like a referral to rehabilitation again.   Outpatient Medications Prior to Visit  Medication Sig Dispense Refill  . diclofenac sodium (VOLTAREN) 1 % GEL Apply 4 g topically 4 (four) times daily. 1 Tube 1  . oxyCODONE-acetaminophen (PERCOCET) 7.5-325 MG per tablet Take 1 tablet by mouth every 4 (four) hours as needed for severe pain. (Patient not taking: Reported on 04/16/2016) 30 tablet 0  . gabapentin (NEURONTIN) 300 MG capsule Take 1 capsule (300 mg total) by mouth 3 (three) times daily. (Patient not taking: Reported on 04/16/2016) 90 capsule 11   No facility-administered medications prior to visit.     ROS Review of Systems  Constitutional: Negative for fatigue and fever.  HENT: Negative for trouble swallowing.   Respiratory: Negative for shortness of breath.   Cardiovascular: Negative for chest pain.  Gastrointestinal: Negative for abdominal pain.  Musculoskeletal: Positive for back pain (left upper trapezius pain).  Skin: Negative for rash.  Neurological: Positive for weakness. Negative for headaches.    Objective:  BP (!) 154/87 (BP  Location: Right Arm, Patient Position: Sitting, Cuff Size: Normal)   Pulse 83   Temp 98.2 F (36.8 C) (Oral)   Resp 16   Wt 150 lb (68 kg)   SpO2 99%   BMI 25.15 kg/m   BP/Weight 04/16/2016 10/08/2015 123456  Systolic BP 123456 [id number]  Diastolic BP 87 76 80  Wt. (Lbs) 150 144 -  BMI 25.15 24.15 -      Physical Exam  Constitutional: He appears well-developed and well-nourished. No distress.  HENT:  Head: Normocephalic and atraumatic.  Eyes: Scleral icterus is present.  Neck: Normal range of motion. Neck supple.  Cardiovascular: Normal rate, regular rhythm and normal heart sounds.   Pulmonary/Chest: Effort normal and breath sounds normal.  Musculoskeletal: Normal range of motion. He exhibits tenderness (Mild TTP of the left distal forearm). He exhibits no edema or deformity.  Skin: Skin is warm and dry. No rash noted. He is not diaphoretic.  Psychiatric: He has a normal mood and affect. His behavior is normal.     Assessment & Plan:   1. Cervical radiculopathy  - Ambulatory Referral to Neuro Rehab - gabapentin (NEURONTIN) 300 MG capsule; Take 1 capsule (300 mg total) by mouth 3 (three) times daily.  Dispense: 90 capsule; Refill: 0  2. Paresthesia  - Ambulatory Referral to Neuro Rehab - gabapentin (NEURONTIN) 300 MG capsule; Take 1 capsule (300 mg total) by mouth 3 (three) times daily.  Dispense: 90 capsule; Refill: 0  3. Neck pain  - Ambulatory  Referral to Neuro Rehab - cyclobenzaprine (FLEXERIL) 10 MG tablet; Take 1 tablet (10 mg total) by mouth 3 (three) times daily as needed for muscle spasms.  Dispense: 30 tablet; Refill: 0   Meds ordered this encounter  Medications  . DISCONTD: gabapentin (NEURONTIN) 300 MG capsule    Sig: Take 1 capsule (300 mg total) by mouth 3 (three) times daily.    Dispense:  90 capsule    Refill:  2    Order Specific Question:   Supervising Provider    Answer:   Tresa Garter [id number]  . DISCONTD: cyclobenzaprine (FLEXERIL)  10 MG tablet    Sig: Take 1 tablet (10 mg total) by mouth 3 (three) times daily as needed for muscle spasms (Do not use during times that mental alertness is required.).    Dispense:  30 tablet    Refill:  0    Order Specific Question:   Supervising Provider    Answer:   Tresa Garter [id number]  . gabapentin (NEURONTIN) 300 MG capsule    Sig: Take 1 capsule (300 mg total) by mouth 3 (three) times daily.    Dispense:  90 capsule    Refill:  0    Order Specific Question:   Supervising Provider    Answer:   Tresa Garter [id number]  . cyclobenzaprine (FLEXERIL) 10 MG tablet    Sig: Take 1 tablet (10 mg total) by mouth 3 (three) times daily as needed for muscle spasms.    Dispense:  30 tablet    Refill:  0    Order Specific Question:   Supervising Provider    Answer:   Tresa Garter [id number]    Follow-up: Return in about 4 weeks (around 05/14/2016) for follow up cervical radiculopathy.   Clent Demark PA

## 2016-04-16 NOTE — Progress Notes (Signed)
Pt states he has sharp pain in left hand that shoots from neck causing numbness and pain. He has had problems in the past, but is progressing.

## 2016-04-16 NOTE — Patient Instructions (Addendum)
I have referred you to [facility]. Please take medications as directed. Do not take Cyclobenzaprine during times that mental alertness is required. Gabapentin will need to be up titrated to an effective dose. To begin, please take one pill of Gabapentin on day one, then one pill twice a day on day two, lastly, one pill three times a day on day three and thereafter. Please follow up in 4 weeks. Go to emergency room should you have loss of sensation, loss of strength, or any other worrisome symptoms.   Paresthesia Introduction Paresthesia is an abnormal burning or prickling sensation. This sensation is generally felt in the hands, arms, legs, or feet. However, it may occur in any part of the body. Usually, it is not painful. The feeling may be described as:  Tingling or numbness.  Pins and needles.  Skin crawling.  Buzzing.  Limbs falling asleep.  Itching. Most people experience temporary (transient) paresthesia at some time in their lives. Paresthesia may occur when you breathe too quickly (hyperventilation). It can also occur without any apparent cause. Commonly, paresthesia occurs when pressure is placed on a nerve. The sensation quickly goes away after the pressure is removed. For some people, however, paresthesia is a long-lasting (chronic) condition that is caused by an underlying disorder. If you continue to have paresthesia, you may need further medical evaluation. Follow these instructions at home: Watch your condition for any changes. Taking the following actions may help to lessen any discomfort that you are feeling:  Avoid drinking alcohol.  Try acupuncture or massage to help relieve your symptoms.  Keep all follow-up visits as directed by your health care provider. This is important. Contact a health care provider if:  You continue to have episodes of paresthesia.  Your burning or prickling feeling gets worse when you walk.  You  have pain, cramps, or dizziness.  You develop a rash. Get help right away if:  You feel weak.  You have trouble walking or moving.  You have problems with speech, understanding, or vision.  You feel confused.  You cannot control your bladder or bowel movements.  You have numbness after an injury.  You faint. This information is not intended to replace advice given to you by your health care provider. Make sure you discuss any questions you have with your health care provider. Document Released: 02/19/2002 Document Revised: 08/07/2015 Document Reviewed: 02/25/2014  2017 Elsevier

## 2016-06-14 ENCOUNTER — Encounter (HOSPITAL_COMMUNITY): Payer: Self-pay | Admitting: Emergency Medicine

## 2016-06-14 ENCOUNTER — Ambulatory Visit (HOSPITAL_COMMUNITY)
Admission: EM | Admit: 2016-06-14 | Discharge: 2016-06-14 | Disposition: A | Discharge status: Home / Self Care | Payer: Medicare Other | Attending: Family Medicine | Admitting: Family Medicine

## 2016-06-14 DIAGNOSIS — W268XXA Contact with other sharp object(s), not elsewhere classified, initial encounter: Secondary | ICD-10-CM

## 2016-06-14 DIAGNOSIS — Z23 Encounter for immunization: Secondary | ICD-10-CM

## 2016-06-14 DIAGNOSIS — M79641 Pain in right hand: Secondary | ICD-10-CM

## 2016-06-14 DIAGNOSIS — S61209A Unspecified open wound of unspecified finger without damage to nail, initial encounter: Secondary | ICD-10-CM

## 2016-06-14 MED ORDER — TETANUS-DIPHTH-ACELL PERTUSSIS 5-2.5-18.5 LF-MCG/0.5 IM SUSP
0.5000 mL | Freq: Once | INTRAMUSCULAR | Status: AC
  Administered 2016-06-14: 0.5 mL via INTRAMUSCULAR

## 2016-06-14 MED ORDER — TETANUS-DIPHTH-ACELL PERTUSSIS 5-2.5-18.5 LF-MCG/0.5 IM SUSP
INTRAMUSCULAR | Status: AC
  Filled 2016-06-14: qty 0.5

## 2016-06-14 NOTE — ED Triage Notes (Signed)
The patient presented to the [facility] with a complaint of a laceration to the pinky finger on his right hand that occurred yesterday with a straight razor. The patient reported that the FD wrapped his finger last night.

## 2016-06-14 NOTE — Discharge Instructions (Signed)
Your tetanus vaccine is been updated, your wound has been cleaned, closed with Dermabond, and bandaged. Keep your hand clean, and dry, avoid using any lotions, creams, Vaseline, antibiotic ointment, or other substances as this can break down the glue. Should you see any signs or symptoms of infection, return to clinic, or go to the emergency room.
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
## 2016-06-23 ENCOUNTER — Ambulatory Visit: Payer: Medicare Other | Attending: Family Medicine | Admitting: Family Medicine

## 2016-06-23 ENCOUNTER — Other Ambulatory Visit (HOSPITAL_COMMUNITY)
Admission: RE | Admit: 2016-06-23 | Discharge: 2016-06-23 | Disposition: A | Discharge status: Home / Self Care | Payer: Medicare Other | Source: Ambulatory Visit | Attending: Family Medicine | Admitting: Family Medicine

## 2016-06-23 ENCOUNTER — Encounter: Payer: Self-pay | Admitting: Family Medicine

## 2016-06-23 VITALS — BP 130/84 | HR 59 | Temp 97.4°F | Resp 18 | Ht 64.0 in | Wt 149.0 lb

## 2016-06-23 DIAGNOSIS — M791 Myalgia, unspecified site: Secondary | ICD-10-CM

## 2016-06-23 DIAGNOSIS — Z8669 Personal history of other diseases of the nervous system and sense organs: Secondary | ICD-10-CM

## 2016-06-23 DIAGNOSIS — G44209 Tension-type headache, unspecified, not intractable: Secondary | ICD-10-CM | POA: Diagnosis not present

## 2016-06-23 DIAGNOSIS — Z202 Contact with and (suspected) exposure to infections with a predominantly sexual mode of transmission: Secondary | ICD-10-CM | POA: Diagnosis not present

## 2016-06-23 DIAGNOSIS — Z113 Encounter for screening for infections with a predominantly sexual mode of transmission: Secondary | ICD-10-CM | POA: Insufficient documentation

## 2016-06-23 DIAGNOSIS — E559 Vitamin D deficiency, unspecified: Secondary | ICD-10-CM | POA: Diagnosis not present

## 2016-06-23 DIAGNOSIS — R51 Headache: Secondary | ICD-10-CM | POA: Diagnosis present

## 2016-06-23 MED ORDER — IBUPROFEN 600 MG PO TABS: 600.0000 mg | ORAL_TABLET | Freq: Three times a day (TID) | ORAL | 0 refills | Status: DC | PRN

## 2016-06-23 NOTE — Progress Notes (Signed)
Patient is here for HIV testing  Patient stated that he scrape his finger at work he already had his tetanus shot on 06/14/16  Patient also complains about headaches  When he lay on his right side it starts to pound it get worst at night  He has to sit up to ease the pain

## 2016-06-23 NOTE — Progress Notes (Signed)
Subjective:  Patient ID: [name], male    DOB: 1970/11/30  Age: 46 y.o. MRN: [id number]  CC: No chief complaint on file.   HPI [name] presents for   Headaches: Began last Thursday. Bilateral, pounding headaches. Denies any head injury, diplopia, dizziness, or difficulty maintaining his balance. Denies taking anything for symptoms.   Muscle weakness: Reports muscle weakness for 1 year. Denies any history of significant muscle injury or spasm.  Blurry vision: 1 year history. No history of eye injury,  Floaters, or dipolpia. He reports strong family history of DM.   STI testing: Reports history of finger injury to the right 5th digit while at work. History of ED visit and tetanus shot for finger injury. Concerned about risk for HIV and other STI due to injury. Denies any penile discharge, lesions, or dysuria. Reports 1 sexual partner within the last 3 months.   He reports his brother had a history of colon cancer and died at 47 y.o. Colonoscopy offered. Pt.reports having colonoscopy performed within the last two years.     No outpatient prescriptions prior to visit.   No facility-administered medications prior to visit.     ROS Review of Systems  Constitutional: Negative.   Eyes: Positive for visual disturbance.  Respiratory: Negative.   Cardiovascular: Negative.   Gastrointestinal: Negative.   Musculoskeletal: Positive for myalgias.  Neurological: Positive for headaches.    Objective:  BP 130/84 (BP Location: Left Arm, Patient Position: Sitting, Cuff Size: Normal)   Pulse (!) 59   Temp 97.4 F (36.3 C) (Oral)   Resp 18   Ht 5\' 4"  (1.626 m)   Wt 149 lb (67.6 kg)   SpO2 96%   BMI 25.58 kg/m   BP/Weight 06/23/2016 4/0/1027 04/19/3662  Systolic BP 403 474 259  Diastolic BP 84 76 87  Wt. (Lbs) 149 - 150  BMI 25.58 - 25.15     Physical Exam  HENT:  Head: Normocephalic.  Right Ear: External ear normal.  Left Ear: External ear normal.  Nose:  Nose normal.  Mouth/Throat: Oropharynx is clear and moist.  Eyes: Conjunctivae and EOM are normal. Pupils are equal, round, and reactive to light.  Neck: No JVD present.  Cardiovascular: Normal rate, regular rhythm, normal heart sounds and intact distal pulses.   Pulmonary/Chest: Effort normal and breath sounds normal.  Abdominal: Soft. Bowel sounds are normal.  Musculoskeletal: Normal range of motion.  Skin: Skin is warm and dry.  Healed wound to lateral 5th digit of right hand.  Nursing note and vitals reviewed.  Assessment & Plan:   Problem List Items Addressed This Visit    None    Visit Diagnoses    Tension headache    -  Primary   -Suspect headaches are related to elevated BP's.   -Schedule BP recheck in 2 weeks with clinic RN.   -If BP is greater than 90/60 (MAP 65 or greater) but not less than 130/80 may add    HTCZ 25 mg QD and recheck in another 2 weeks with clinic RN.   -Follow up in 1 month with PCP for elevated BP.    Relevant Medications   ibuprofen (ADVIL,MOTRIN) 600 MG tablet   Other Relevant Orders   HEP, RPR, HIV Panel   Urine cytology ancillary only   POCT D6L   Basic metabolic panel   History of blurry vision       -Vision acuity screen   Relevant Orders   POCT A1C  Ambulatory referral to Ophthalmology   Myalgia       Relevant Orders   Vitamin D, 25-hydroxy   Screening for STDs (sexually transmitted diseases)       Relevant Orders   HEP, RPR, HIV Panel   Urine cytology ancillary only      Meds ordered this encounter  Medications  . ibuprofen (ADVIL,MOTRIN) 600 MG tablet    Sig: Take 1 tablet (600 mg total) by mouth every 8 (eight) hours as needed.    Dispense:  30 tablet    Refill:  0    Order Specific Question:   Supervising Provider    Answer:   Tresa Garter [id number]    Follow-up: Return if symptoms worsen or fail to improve. Return in about 2 weeks (around 07/07/2016), for BP check with clinic RN.  Alfonse Spruce  FNP

## 2016-06-23 NOTE — Patient Instructions (Signed)

## 2016-06-24 LAB — HEP, RPR, HIV PANEL
HEP B S AG: NEGATIVE
HIV Screen 4th Generation wRfx: NONREACTIVE
RPR: NONREACTIVE

## 2016-06-24 LAB — BASIC METABOLIC PANEL
BUN/Creatinine Ratio: 16 (ref 9–20)
BUN: 16 mg/dL (ref 6–24)
CALCIUM: 9.8 mg/dL (ref 8.7–10.2)
CHLORIDE: 101 mmol/L (ref 96–106)
CO2: 27 mmol/L (ref 18–29)
CREATININE: 1.03 mg/dL (ref 0.76–1.27)
GFR calc Af Amer: 100 mL/min/{1.73_m2} (ref >59)
GFR calc non Af Amer: 87 mL/min/{1.73_m2} (ref >59)
GLUCOSE: 94 mg/dL (ref 65–99)
Potassium: 3.9 mmol/L (ref 3.5–5.2)
Sodium: 141 mmol/L (ref 134–144)

## 2016-06-24 LAB — URINE CYTOLOGY ANCILLARY ONLY
Chlamydia: NEGATIVE
NEISSERIA GONORRHEA: NEGATIVE
TRICH (WINDOWPATH): NEGATIVE

## 2016-06-24 LAB — VITAMIN D 25 HYDROXY (VIT D DEFICIENCY, FRACTURES): Vit D, 25-Hydroxy: 8.2 ng/mL — ABNORMAL LOW (ref 30.0–100.0)

## 2016-06-25 ENCOUNTER — Other Ambulatory Visit: Payer: Self-pay | Admitting: Family Medicine

## 2016-06-25 DIAGNOSIS — E559 Vitamin D deficiency, unspecified: Secondary | ICD-10-CM

## 2016-06-25 MED ORDER — VITAMIN D (ERGOCALCIFEROL) 1.25 MG (50000 UNIT) PO CAPS: 50000.0000 [IU] | ORAL_CAPSULE | ORAL | 0 refills | Status: DC

## 2016-06-28 ENCOUNTER — Telehealth: Payer: Self-pay

## 2016-06-28 LAB — URINE CYTOLOGY ANCILLARY ONLY
Bacterial vaginitis: NEGATIVE
CANDIDA VAGINITIS: NEGATIVE

## 2016-06-28 NOTE — Telephone Encounter (Signed)
CMA call to go over lab results  Patient Verify DOB  Patient was aware and understood lab results

## 2016-06-28 NOTE — Telephone Encounter (Signed)
-----   Message from Alfonse Spruce, [place] sent at 06/25/2016  6:05 AM EDT ----- HIV is negative.  Hepatitis B is negative.  Syphilis negative. Gonorrhea, Chlamydia, and Trichomonas were all negative. Vitamin D level was low. Vitamin D helps to keep bones strong. When low it can cause muscle aches, fatigue, and bone pain. You were prescribed ergocalciferol (capsules) to increase your vitamin-d level. Once finished start taking OTC vitamin d supplement with 800 international units (IU) of vitamin-d per day. Recommend recheck in 3 months.  Kidney function normal

## 2016-07-07 ENCOUNTER — Ambulatory Visit: Payer: Medicare Other | Attending: Family Medicine | Admitting: *Deleted

## 2016-07-07 VITALS — BP 140/80 | HR 72 | Resp 16

## 2016-07-07 DIAGNOSIS — I10 Essential (primary) hypertension: Secondary | ICD-10-CM | POA: Diagnosis not present

## 2016-07-07 MED ORDER — HYDROCHLOROTHIAZIDE 25 MG PO TABS: 25.0000 mg | ORAL_TABLET | Freq: Every day | ORAL | 3 refills | Status: DC

## 2016-07-07 NOTE — Patient Instructions (Signed)
HTCZ 25 mg QD and recheck in another 2 weeks with clinic RN.

## 2016-07-07 NOTE — Progress Notes (Signed)
Pt arrived for BP check.  Manual BP taken, reading 140/80 left arm. BP taken twice with several minutes in between. Unable to take BP in right arm d/t elbow pain.    Per note: If BP is greater than 90/60 but not less 130/80 may add -HTCZ 25 mg daily. Prescription sent to the pharmacy.  Pt aware to follow up in 2 weeks for BP check with nurse.

## 2016-07-08 ENCOUNTER — Encounter: Payer: Self-pay | Admitting: Family Medicine

## 2016-07-08 ENCOUNTER — Ambulatory Visit: Payer: Medicare Other | Attending: Family Medicine | Admitting: Family Medicine

## 2016-07-08 VITALS — BP 136/84 | HR 64 | Temp 97.9°F | Resp 18 | Ht 64.0 in | Wt 152.0 lb

## 2016-07-08 DIAGNOSIS — M7989 Other specified soft tissue disorders: Secondary | ICD-10-CM | POA: Diagnosis not present

## 2016-07-08 DIAGNOSIS — G44209 Tension-type headache, unspecified, not intractable: Secondary | ICD-10-CM | POA: Insufficient documentation

## 2016-07-08 DIAGNOSIS — R29898 Other symptoms and signs involving the musculoskeletal system: Secondary | ICD-10-CM | POA: Diagnosis not present

## 2016-07-08 DIAGNOSIS — S59901A Unspecified injury of right elbow, initial encounter: Secondary | ICD-10-CM | POA: Diagnosis not present

## 2016-07-08 DIAGNOSIS — R208 Other disturbances of skin sensation: Secondary | ICD-10-CM | POA: Diagnosis not present

## 2016-07-08 DIAGNOSIS — M25521 Pain in right elbow: Secondary | ICD-10-CM

## 2016-07-08 DIAGNOSIS — Z299 Encounter for prophylactic measures, unspecified: Secondary | ICD-10-CM

## 2016-07-08 MED ORDER — DIPHENHYDRAMINE HCL 25 MG PO CAPS: 25.0000 mg | ORAL_CAPSULE | Freq: Four times a day (QID) | ORAL | 0 refills | Status: DC | PRN

## 2016-07-08 MED ORDER — ACETAMINOPHEN-CODEINE #3 300-30 MG PO TABS: 1.0000 | ORAL_TABLET | Freq: Three times a day (TID) | ORAL | 0 refills | Status: DC | PRN

## 2016-07-08 MED ORDER — IBUPROFEN 600 MG PO TABS: 600.0000 mg | ORAL_TABLET | Freq: Three times a day (TID) | ORAL | 0 refills | Status: DC | PRN

## 2016-07-08 NOTE — Progress Notes (Signed)
Patient is here for right elbow pain  Patient has a knot on the right elbow  Patient has not started on his current medication yet

## 2016-07-08 NOTE — Patient Instructions (Signed)
Acetaminophen; Codeine tablets  What is this medicine? ACETAMINOPHEN; CODEINE (a set a MEE noe fen; KOE deen) is a pain reliever. It is used to treat mild to moderate pain. This medicine may be used for other purposes; ask your health care provider or pharmacist if you have questions. COMMON BRAND NAME(S): Cocet, Cocet Plus, Tylenol with Codeine No.3, Tylenol with Codeine No.4, Vopac What should I tell my health care provider before I take this medicine? They need to know if you have any of these conditions: -brain tumor -Crohn's disease, inflammatory bowel disease, or ulcerative colitis -drug abuse or addiction -head injury -heart or circulation problems -if you often drink alcohol -kidney disease or problems going to the bathroom -liver disease -lung disease, asthma, or breathing problems -an unusual or allergic reaction to acetaminophen, codeine, salicylates, other opioid analgesics, other medicines, foods, dyes, or preservatives -pregnant or trying to get pregnant -breast-feeding How should I use this medicine? Take this medicine by mouth with a full glass of water. Follow the directions on the prescription label. You can take it with or without food. If if upsets your stomach, take the medicine with food. Do not take your medicine more often than directed. A special MedGuide will be given to you by the pharmacist with each prescription and refill. Be sure to read this information carefully each time. Talk to your pediatrician regarding the use of this medicine in children. Special care may be needed. Overdosage: If you think you have taken too much of this medicine contact a poison control center or emergency room at once. NOTE: This medicine is only for you. Do not share this medicine with others. What if I miss a dose? If you miss a dose, take it as soon as you can. If it is almost time for your next dose, take only that dose. Do not take double or extra doses. What may interact  with this medicine? This medicine may interact with the following medications: -alcohol -antihistamines for allergy, cough and cold -antiviral medicines used for HIV or AIDS -atropine -certain antibiotics like erythromycin and clarithromycin -certain medicines for anxiety or sleep -certain medicines for bladder problems like oxybutynin, tolterodine -certain medicines for depression like amitriptyline, fluoxetine, sertraline -certain medicines for fungal infections like ketoconazole and itraconazole -certain medicines for irregular heart beat like amiodarone, propafenone, quinidine -certain medicines for Parkinson's disease like benztropine, trihexyphenidyl -certain medicines for seizures like carbamazepine, phenobarbital, phenytoin, primidone -certain medicines for stomach problems like dicyclomine, hyoscyamine -certain medicines for travel sickness like scopolamine -general anesthetics like halothane, isoflurane, methoxyflurane, propofol -ipratropium -local anesthetics like lidocaine, pramoxine, tetracaine -MAOIs like Carbex, Eldepryl, Marplan, Nardil, and Parnate -medicines that relax muscles for surgery -other medicines with acetaminophen -other narcotic medicines for pain or cough -phenothiazines like chlorpromazine, mesoridazine, prochlorperazine, thioridazine -rifampin This list may not describe all possible interactions. Give your health care provider a list of all the medicines, herbs, non-prescription drugs, or dietary supplements you use. Also tell them if you smoke, drink alcohol, or use illegal drugs. Some items may interact with your medicine. What should I watch for while using this medicine? Tell your doctor or health care professional if your pain does not go away, if it gets worse, or if you have new or a different type of pain. You may develop tolerance to the medication. Tolerance means that you will need a higher dose of the medication for pain relief. Tolerance is  normal and is expected if you take the medicine for a long time. Do  not suddenly stop taking your medicine because you may develop a severe reaction. Your body becomes used to the medicine. This does NOT mean you are addicted. Addiction is a behavior related to getting and using a drug for a non medical reason. If you have pain, you have a medical reason to take pain medicine. Your doctor will tell you how much medicine to take. If your doctor wants you to stop the medicine, the dose will be slowly lowered over time to avoid any side effects. There are different types of narcotic medicines (opiates). If you take more than one type at the same time or if you are taking another medicine that also causes drowsiness, you may have more side effects. Give your health care provider a list of all medicines you use. Your doctor will tell you how much medicine to take. Do not take more medicine than directed. Call emergency for help if you have problems breathing or unusual sleepiness. Do not take other medicines that contain acetaminophen with this medicine. Always read labels carefully. If you have questions, ask your doctor or pharmacist. If you take too much acetaminophen get medical help right away. Too much acetaminophen can be very dangerous and cause liver damage. Even if you do not have symptoms, it is important to get help right away You may get drowsy or dizzy. Do not drive, use machinery, or do anything that needs mental alertness until you know how this medicine affects you. Do not stand or sit up quickly, especially if you are an older patient. This reduces the risk of dizzy or fainting spells. Alcohol may interfere with the effect of this medicine. Avoid alcoholic drinks. The medicine will cause constipation. Try to have a bowel movement at least every 2 to 3 days. If you do not have a bowel movement for 3 days, call your doctor or health care professional. Your mouth may get dry. Chewing sugarless gum  or sucking hard candy, and drinking plenty of water may help. Contact your doctor if the problem does not go away or is severe. Immediately call your physician or get emergency help if you are breast-feeding and your baby is sleepier than usual, is limp, or has difficulty breastfeeding or breathing. Children may be at higher risk for side effects. If your child has slow breathing, noisy breathing, confusion, or unusual sleepiness, stop giving this medicine and get medical help right away. What side effects may I notice from receiving this medicine? Side effects that you should report to your doctor or health care professional as soon as possible: -allergic reactions like skin rash, itching or hives, swelling of the face, lips, or tongue -breathing problems -confusion -redness, blistering, peeling or loosening of the skin, including inside the mouth -signs and symptoms of low blood pressure like dizziness; feeling faint or lightheaded, falls; unusually weak or tired -trouble passing urine or change in the amount of urine -yellowing of the eyes or skin Side effects that usually do not require medical attention (report to your doctor or health care professional if they continue or are bothersome): -constipation -dry mouth -nausea, vomiting -tiredness This list may not describe all possible side effects. Call your doctor for medical advice about side effects. You may report side effects to FDA at 1-800-FDA-1088. Where should I keep my medicine? Keep out of the reach of children. This medicine can be abused. Keep your medicine in a safe place to protect it from theft. Do not share this medicine with anyone. Selling or  giving away this medicine is dangerous and against the law. This medicine may cause accidental overdose and death if it taken by other adults, children, or pets. Mix any unused medicine with a substance like cat litter or coffee grounds. Then throw the medicine away in a sealed container  like a sealed bag or a coffee can with a lid. Do not use the medicine after the expiration date. Store at room temperature between 15 and 30 degrees C (59 and 86 degrees F). NOTE: This sheet is a summary. It may not cover all possible information. If you have questions about this medicine, talk to your doctor, pharmacist, or health care provider.  2018 Elsevier/Gold Standard (2014-11-22 13:44:28)

## 2016-07-08 NOTE — Progress Notes (Signed)
Subjective:  Patient ID: [name], male    DOB: 1971/01/21  Age: 46 y.o. MRN: [id number]  CC: Establish Care   HPI DEROY NOAH presents for    Right elbow pain: Reports pain started 2 weeks ago and has worsened since Monday.. Complaints of right elbow pain, swelling, tenderness, and burning sensation. Pain 7/10.  Also reports decreased hand right grip for 1 year.  History of trauma to right elbow when he hit it against the door at work. Pain is aggravated by tasks like driving. Reports taking ibuprofen and significant others Vicodin for relief of symptoms.    Outpatient Medications Prior to Visit  Medication Sig Dispense Refill  . hydrochlorothiazide (HYDRODIURIL) 25 MG tablet Take 1 tablet (25 mg total) by mouth daily. 90 tablet 3  . ibuprofen (ADVIL,MOTRIN) 600 MG tablet Take 1 tablet (600 mg total) by mouth every 8 (eight) hours as needed. 30 tablet 0  . Vitamin D, Ergocalciferol, (DRISDOL) 50000 units CAPS capsule Take 1 capsule (50,000 Units total) by mouth every 7 (seven) days. (Patient not taking: Reported on 07/07/2016) 8 capsule 0   No facility-administered medications prior to visit.     ROS Review of Systems  Respiratory: Negative.   Cardiovascular: Negative.   Musculoskeletal: Positive for arthralgias.  Neurological: Positive for weakness.       Parathesias    Objective:  BP 136/84 (BP Location: Left Arm, Patient Position: Sitting, Cuff Size: Normal)   Pulse 64   Temp 97.9 F (36.6 C) (Oral)   Resp 18   Ht 5\' 4"  (1.626 m)   Wt 152 lb (68.9 kg)   SpO2 97%   BMI 26.09 kg/m   BP/Weight 07/08/2016 07/07/2016 2/72/5366  Systolic BP 440 347 425  Diastolic BP 84 80 84  Wt. (Lbs) 152 - 149  BMI 26.09 - 25.58   Physical Exam  Cardiovascular: Normal rate, regular rhythm, normal heart sounds and intact distal pulses.   Pulmonary/Chest: Effort normal and breath sounds normal.  Abdominal: Soft. Bowel sounds are normal.  Musculoskeletal:       Right  elbow: He exhibits no deformity. Tenderness found.       Right hand: Decreased strength noted.  Nursing note and vitals reviewed.  Assessment & Plan:   Problem List Items Addressed This Visit    None    Visit Diagnoses    Right elbow pain    -  Primary   Relevant Medications   acetaminophen-codeine (TYLENOL #3) 300-30 MG tablet   Other Relevant Orders   DG Elbow Complete Right   Ambulatory referral to Orthopedics   Decreased grip strength of right hand       Relevant Orders   Ambulatory referral to Orthopedics   Burning sensation       Tension headache       Relevant Medications   ibuprofen (ADVIL,MOTRIN) 600 MG tablet   acetaminophen-codeine (TYLENOL #3) 300-30 MG tablet   Prophylactic measure       Patient self reports Vicodin use with no side effects. Has allergy to Tramadol listed in chart.   Will order benadryl prophylactic  for Tylenol # 3 use.    Relevant Medications   diphenhydrAMINE (BENADRYL) 25 mg capsule      Meds ordered this encounter  Medications  . DISCONTD: acetaminophen-codeine (TYLENOL #3) 300-30 MG tablet    Sig: Take 1 tablet by mouth every 8 (eight) hours as needed for severe pain.    Dispense:  30 tablet  Refill:  0    Order Specific Question:   Supervising Provider    Answer:   Tresa Garter [id number]  . ibuprofen (ADVIL,MOTRIN) 600 MG tablet    Sig: Take 1 tablet (600 mg total) by mouth every 8 (eight) hours as needed for moderate pain (Take with food).    Dispense:  30 tablet    Refill:  0    Order Specific Question:   Supervising Provider    Answer:   Tresa Garter [id number]  . diphenhydrAMINE (BENADRYL) 25 mg capsule    Sig: Take 1 capsule (25 mg total) by mouth every 6 (six) hours as needed.    Dispense:  30 capsule    Refill:  0    Order Specific Question:   Supervising Provider    Answer:   Tresa Garter [id number]  . acetaminophen-codeine (TYLENOL #3) 300-30 MG tablet    Sig: Take 1 tablet by mouth every 8  (eight) hours as needed for severe pain.    Dispense:  30 tablet    Refill:  0    Order Specific Question:   Supervising Provider    Answer:   Tresa Garter [id number]    Follow-up: Return in about 2 weeks (around 07/22/2016), or if symptoms worsen or fail to improve, for HTN .   Alfonse Spruce FNP

## 2016-07-20 ENCOUNTER — Ambulatory Visit (INDEPENDENT_AMBULATORY_CARE_PROVIDER_SITE_OTHER): Payer: Medicare Other

## 2016-07-20 ENCOUNTER — Ambulatory Visit (INDEPENDENT_AMBULATORY_CARE_PROVIDER_SITE_OTHER): Payer: Medicare Other | Admitting: Orthopedic Surgery

## 2016-07-20 ENCOUNTER — Encounter (INDEPENDENT_AMBULATORY_CARE_PROVIDER_SITE_OTHER): Payer: Self-pay | Admitting: Orthopedic Surgery

## 2016-07-20 VITALS — Ht 64.0 in | Wt 152.0 lb

## 2016-07-20 DIAGNOSIS — M25521 Pain in right elbow: Secondary | ICD-10-CM | POA: Diagnosis not present

## 2016-07-20 DIAGNOSIS — M7711 Lateral epicondylitis, right elbow: Secondary | ICD-10-CM | POA: Insufficient documentation

## 2016-07-20 MED ORDER — LIDOCAINE HCL 1 % IJ SOLN
1.0000 mL | INTRAMUSCULAR | Status: AC | PRN
  Administered 2016-07-20: 1 mL

## 2016-07-20 MED ORDER — METHYLPREDNISOLONE ACETATE 40 MG/ML IJ SUSP
40.0000 mg | INTRAMUSCULAR | Status: AC | PRN
  Administered 2016-07-20: 40 mg via INTRA_ARTICULAR
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
## 2016-07-21 ENCOUNTER — Ambulatory Visit: Payer: Medicare Other | Attending: Family Medicine | Admitting: *Deleted

## 2016-07-21 VITALS — BP 130/70 | HR 72 | Resp 16 | Wt 150.8 lb

## 2016-07-21 DIAGNOSIS — Z0130 Encounter for examination of blood pressure without abnormal findings: Secondary | ICD-10-CM | POA: Insufficient documentation

## 2016-07-21 DIAGNOSIS — I10 Essential (primary) hypertension: Secondary | ICD-10-CM | POA: Diagnosis not present

## 2016-07-21 NOTE — Progress Notes (Signed)
Pt arrives to [facility] accompanied by his wife.  He is alert and oriented.   When he took first dose of  HCTZ, he states he had a reaction Itching on back from a rash. Denies difficulty breating or facial swelling.  Since then he has not had a reaction.  He denies reaction since then and takes medication daily. He states this helps his headaches.  He states he take his medication daily. BP taken manually. BP reading 130/70.  Denies chest pain, SOB, or swelling. Denies new vision concerns.  Provider notified of reaction. Nurse visit will be routed to provider.

## 2016-07-23 ENCOUNTER — Ambulatory Visit: Payer: Medicare Other | Attending: Family Medicine | Admitting: Family Medicine

## 2016-07-23 ENCOUNTER — Encounter: Payer: Self-pay | Admitting: Family Medicine

## 2016-07-23 VITALS — BP 113/68 | HR 67 | Temp 97.8°F | Resp 18 | Ht 64.0 in | Wt 149.2 lb

## 2016-07-23 DIAGNOSIS — I10 Essential (primary) hypertension: Secondary | ICD-10-CM | POA: Diagnosis not present

## 2016-07-23 DIAGNOSIS — R21 Rash and other nonspecific skin eruption: Secondary | ICD-10-CM | POA: Diagnosis not present

## 2016-07-23 DIAGNOSIS — L247 Irritant contact dermatitis due to plants, except food: Secondary | ICD-10-CM | POA: Insufficient documentation

## 2016-07-23 DIAGNOSIS — Z79899 Other long term (current) drug therapy: Secondary | ICD-10-CM | POA: Diagnosis not present

## 2016-07-23 DIAGNOSIS — E559 Vitamin D deficiency, unspecified: Secondary | ICD-10-CM

## 2016-07-23 DIAGNOSIS — F17200 Nicotine dependence, unspecified, uncomplicated: Secondary | ICD-10-CM | POA: Insufficient documentation

## 2016-07-23 LAB — POCT UA - MICROALBUMIN
CREATININE, POC: 300 mg/dL
Microalbumin Ur, POC: 30 mg/L

## 2016-07-23 MED ORDER — HYDROCORTISONE 2.5 % EX CREA: TOPICAL_CREAM | Freq: Two times a day (BID) | CUTANEOUS | 0 refills | Status: DC

## 2016-07-23 MED ORDER — DIPHENHYDRAMINE HCL 25 MG PO CAPS: 25.0000 mg | ORAL_CAPSULE | Freq: Three times a day (TID) | ORAL | 0 refills | Status: DC | PRN

## 2016-07-23 MED ORDER — VITAMIN D (ERGOCALCIFEROL) 1.25 MG (50000 UNIT) PO CAPS: 50000.0000 [IU] | ORAL_CAPSULE | ORAL | 0 refills | Status: AC

## 2016-07-23 NOTE — Progress Notes (Signed)
Subjective:  Patient ID: [name], male    DOB: October 26, 1970  Age: 46 y.o. MRN: [id number]  CC: Hypertension   HPI [name] presents for   Hypertension: Patient here for follow-up of elevated blood pressure. He is not exercising and is not adherent to low salt diet. Blood pressure He does not check his BP at home. well controlled at home. Cardiac symptoms none. Patient denies chest pain, claudication, dyspnea, lower extremity edema, palpitations and syncope.  Cardiovascular risk factors: hypertension, male gender, sedentary lifestyle and smoking/ tobacco exposure. Current smoker not ready to quit at this time. Use of agents associated with hypertension: none. History of target organ damage: none.  Dermatitis: Patient complains of a rash to upper exterimties and neck . Symptoms began 1 week ago. He reports contact with plants in his sister's backyard. Patient describes the rash as maculopapular. Characteristics of rash and associated history: Similar rash in the past? no, Is rash pruritic?  yes, Alleviating factors? anttihistamines. Medications currently using: benadryl. Environmental exposures or allergies: plants.    Outpatient Medications Prior to Visit  Medication Sig Dispense Refill  . acetaminophen-codeine (TYLENOL #3) 300-30 MG tablet Take 1 tablet by mouth every 8 (eight) hours as needed for severe pain. 30 tablet 0  . hydrochlorothiazide (HYDRODIURIL) 25 MG tablet Take 1 tablet (25 mg total) by mouth daily. 90 tablet 3  . ibuprofen (ADVIL,MOTRIN) 600 MG tablet Take 1 tablet (600 mg total) by mouth every 8 (eight) hours as needed for moderate pain (Take with food). 30 tablet 0  . diphenhydrAMINE (BENADRYL) 25 mg capsule Take 1 capsule (25 mg total) by mouth every 6 (six) hours as needed. (Patient not taking: Reported on 07/21/2016) 30 capsule 0  . Vitamin D, Ergocalciferol, (DRISDOL) 50000 units CAPS capsule Take 1 capsule (50,000 Units total) by mouth every 7 (seven)  days. 8 capsule 0   No facility-administered medications prior to visit.     ROS Review of Systems  Constitutional: Negative.   Eyes: Negative.   Respiratory: Negative.   Cardiovascular: Negative.   Gastrointestinal: Negative.   Skin: Negative.   Neurological: Negative.     Objective:  BP 113/68 (BP Location: Left Arm, Patient Position: Sitting, Cuff Size: Normal)   Pulse 67   Temp 97.8 F (36.6 C) (Oral)   Resp 18   Ht 5\' 4"  (1.626 m)   Wt 149 lb 3.2 oz (67.7 kg)   SpO2 96%   BMI 25.61 kg/m   BP/Weight 07/23/2016 03/23/4172 0/10/1446  Systolic BP 185 631 -  Diastolic BP 68 70 -  Wt. (Lbs) 149.2 150.8 152  BMI 25.61 25.88 26.09    Physical Exam  Constitutional: He appears well-developed and well-nourished.  Eyes: Conjunctivae are normal. Pupils are equal, round, and reactive to light.  Neck: No JVD present.  Cardiovascular: Normal rate, regular rhythm, normal heart sounds and intact distal pulses.   Pulmonary/Chest: Effort normal and breath sounds normal.  Abdominal: Soft. Bowel sounds are normal.  Skin: Skin is warm and dry. Rash noted. Rash is maculopapular (pruritic; no drainage or scabing present).  Nursing note and vitals reviewed.    Assessment & Plan:   Problem List Items Addressed This Visit    None    Visit Diagnoses    Essential hypertension    -  Primary   Will keep antihypertensive medication at current dose.   Encouraged smoking cessation.    Relevant Orders   Lipid Panel (Completed)   POCT UA -  Microalbumin (Completed)   Vitamin D deficiency       Relevant Medications   Vitamin D, Ergocalciferol, (DRISDOL) 50000 units CAPS capsule   Irritant contact dermatitis due to plants, except food       Relevant Medications   hydrocortisone 2.5 % cream   diphenhydrAMINE (BENADRYL) 25 mg capsule      Meds ordered this encounter  Medications  . Vitamin D, Ergocalciferol, (DRISDOL) 50000 units CAPS capsule    Sig: Take 1 capsule (50,000 Units total)  by mouth every 7 (seven) days.    Dispense:  8 capsule    Refill:  0    Order Specific Question:   Supervising Provider    Answer:   Tresa Garter [id number]  . hydrocortisone 2.5 % cream    Sig: Apply topically 2 (two) times daily. For 2 weeks.    Dispense:  30 g    Refill:  0    Order Specific Question:   Supervising Provider    Answer:   Tresa Garter [id number]  . diphenhydrAMINE (BENADRYL) 25 mg capsule    Sig: Take 1 capsule (25 mg total) by mouth every 8 (eight) hours as needed for itching.    Dispense:  30 capsule    Refill:  0    Order Specific Question:   Supervising Provider    Answer:   Tresa Garter [[id number]]    Follow-up: Return in about 1 month (around 08/23/2016) for Physical.   Alfonse Spruce FNP

## 2016-07-23 NOTE — Patient Instructions (Signed)
After completing vitamin d prescription. Began taking over the counter vitamin d supplement with 800 IU of vitamin d.  Contact Dermatitis Dermatitis is redness, soreness, and swelling (inflammation) of the skin. Contact dermatitis is a reaction to certain substances that touch the skin. You either touched something that irritated your skin, or you have allergies to something you touched. Follow these instructions at home: [place] your skin as needed.  Apply cool compresses to the affected areas.  Try taking a bath with:  Epsom salts. Follow the instructions on the package. You can get these at a pharmacy or grocery store.  Baking soda. Pour a small amount into the bath as told by your doctor.  Colloidal oatmeal. Follow the instructions on the package. You can get this at a pharmacy or grocery store.  Try applying baking soda paste to your skin. Stir water into baking soda until it looks like paste.  Do not scratch your skin.  Bathe less often.  Bathe in lukewarm water. Avoid using hot water. Medicines   Take or apply over-the-counter and prescription medicines only as told by your doctor.  If you were prescribed an antibiotic medicine, take or apply your antibiotic as told by your doctor. Do not stop taking the antibiotic even if your condition starts to get better. General instructions   Keep all follow-up visits as told by your doctor. This is important.  Avoid the substance that caused your reaction. If you do not know what caused it, keep a journal to try to track what caused it. Write down:  What you eat.  What cosmetic products you use.  What you drink.  What you wear in the affected area. This includes jewelry.  If you were given a bandage (dressing), take care of it as told by your doctor. This includes when to change and remove it. Contact a doctor if:  You do not get better with treatment.  Your condition gets worse.  You have signs of  infection such as:  Swelling.  Tenderness.  Redness.  Soreness.  Warmth.  You have a fever.  You have new symptoms. Get help right away if:  You have a very bad headache.  You have neck pain.  Your neck is stiff.  You throw up (vomit).  You feel very sleepy.  You see red streaks coming from the affected area.  Your bone or joint underneath the affected area becomes painful after the skin has healed.  The affected area turns darker.  You have trouble breathing. This information is not intended to replace advice given to you by your health care provider. Make sure you discuss any questions you have with your health care provider. Document Released: 12/27/2008 Document Revised: 08/07/2015 Document Reviewed: 07/17/2014 Elsevier Interactive Patient Education  2017 [employer].

## 2016-07-23 NOTE — Progress Notes (Signed)
Patient is here for f/up  Patient denies pain for today  Patient ah snot taking his medication for today    Patient has not eaten for today

## 2016-07-24 LAB — LIPID PANEL
CHOL/HDL RATIO: 6 ratio — AB (ref 0.0–5.0)
Cholesterol, Total: 234 mg/dL — ABNORMAL HIGH (ref 100–199)
HDL: 39 mg/dL — ABNORMAL LOW (ref >39)
LDL CALC: 176 mg/dL — AB (ref 0–99)
TRIGLYCERIDES: 97 mg/dL (ref 0–149)
VLDL Cholesterol Cal: 19 mg/dL (ref 5–40)

## 2016-08-02 ENCOUNTER — Other Ambulatory Visit: Payer: Self-pay | Admitting: Family Medicine

## 2016-08-02 ENCOUNTER — Telehealth: Payer: Self-pay

## 2016-08-02 DIAGNOSIS — E782 Mixed hyperlipidemia: Secondary | ICD-10-CM

## 2016-08-02 MED ORDER — PRAVASTATIN SODIUM 20 MG PO TABS: 20.0000 mg | ORAL_TABLET | Freq: Every day | ORAL | 0 refills | Status: DC

## 2016-08-02 NOTE — Telephone Encounter (Signed)
-----   Message from Alfonse Spruce, [place] sent at 08/02/2016 10:12 AM EDT ----- Lipid levels were elevated. This can increase your risk of heart disease overtime. You were prescribed pravastatin to help lower risk. Start eating a diet low in saturated fat. Limit your intake of fried foods, red meats, and whole milk. Increase physical activity. Recommend follow up in 3 months.

## 2016-08-02 NOTE — Telephone Encounter (Signed)
CMA call regarding lab results   Patient Verify DOB   Patient was aware and understood  

## 2016-08-26 ENCOUNTER — Encounter: Payer: Medicare Other | Admitting: Family Medicine

## 2016-09-13 ENCOUNTER — Encounter: Payer: Medicare Other | Admitting: Family Medicine

## 2016-09-27 ENCOUNTER — Ambulatory Visit: Payer: Medicare Other | Admitting: Family Medicine

## 2016-09-28 ENCOUNTER — Ambulatory Visit: Payer: Medicare Other | Admitting: Family Medicine

## 2016-10-20 ENCOUNTER — Ambulatory Visit: Payer: Medicare Other | Attending: Family Medicine | Admitting: Family Medicine

## 2016-10-20 ENCOUNTER — Encounter: Payer: Self-pay | Admitting: Family Medicine

## 2016-10-20 VITALS — BP 124/77 | HR 88 | Temp 98.5°F | Resp 18 | Ht 64.0 in | Wt 151.8 lb

## 2016-10-20 DIAGNOSIS — M25521 Pain in right elbow: Secondary | ICD-10-CM | POA: Diagnosis not present

## 2016-10-20 DIAGNOSIS — M79601 Pain in right arm: Secondary | ICD-10-CM | POA: Diagnosis present

## 2016-10-20 MED ORDER — ELBOW SUPPORT MISC: 0 refills | Status: DC

## 2016-10-20 MED ORDER — ACETAMINOPHEN-CODEINE #3 300-30 MG PO TABS: 2.0000 | ORAL_TABLET | ORAL | 0 refills | Status: DC | PRN

## 2016-10-20 MED ORDER — IBUPROFEN 600 MG PO TABS: 600.0000 mg | ORAL_TABLET | Freq: Three times a day (TID) | ORAL | 0 refills | Status: DC | PRN

## 2016-10-20 NOTE — Progress Notes (Signed)
Patient is here for right arm knot

## 2016-10-20 NOTE — Progress Notes (Signed)
Subjective:  Patient ID: [name], male    DOB: March 16, 1970  Age: 46 y.o. MRN: [id number]  CC: Arm Pain   HPI [name] presents for complains of arthralgias for which has been present for 3 months. Pain is located in the right elbow(s), is described as aching, and is constant .  Associated symptoms include: decreased range of motion and tenderness. He also reports decreased hand right grip for 1 year. Pain is aggravated by tasks like driving.The patient has tried NSAIDs, rest and narcotics for pain, with minimal relief.  Pain is moderate to severe. He reports history of trauma to right elbow when he hit it against the door at work.      Outpatient Medications Prior to Visit  Medication Sig Dispense Refill  . diphenhydrAMINE (BENADRYL) 25 mg capsule Take 1 capsule (25 mg total) by mouth every 8 (eight) hours as needed for itching. 30 capsule 0  . hydrochlorothiazide (HYDRODIURIL) 25 MG tablet Take 1 tablet (25 mg total) by mouth daily. 90 tablet 3  . hydrocortisone 2.5 % cream Apply topically 2 (two) times daily. For 2 weeks. 30 g 0  . pravastatin (PRAVACHOL) 20 MG tablet Take 1 tablet (20 mg total) by mouth daily. 90 tablet 0  . acetaminophen-codeine (TYLENOL #3) 300-30 MG tablet Take 1 tablet by mouth every 8 (eight) hours as needed for severe pain. 30 tablet 0  . ibuprofen (ADVIL,MOTRIN) 600 MG tablet Take 1 tablet (600 mg total) by mouth every 8 (eight) hours as needed for moderate pain (Take with food). 30 tablet 0   No facility-administered medications prior to visit.     ROS Review of Systems  Respiratory: Negative.   Cardiovascular: Negative.   Musculoskeletal: Positive for arthralgias.  Neurological: Positive for weakness.   Objective:  BP 124/77 (BP Location: Left Arm, Patient Position: Sitting, Cuff Size: Normal)   Pulse 88   Temp 98.5 F (36.9 C) (Oral)   Resp 18   Ht 5\' 4"  (1.626 m)   Wt 151 lb 12.8 oz (68.9 kg)   SpO2 95%   BMI 26.06 kg/m    BP/Weight 10/20/2016 9/56/3875 08/17/3327  Systolic BP 518 841 660  Diastolic BP 77 68 70  Wt. (Lbs) 151.8 149.2 150.8  BMI 26.06 25.61 25.88   Physical Exam  Constitutional: He appears well-developed and well-nourished.  Cardiovascular: Normal rate, regular rhythm, normal heart sounds and intact distal pulses.   Pulmonary/Chest: Effort normal and breath sounds normal.  Abdominal: Soft. Bowel sounds are normal.  Musculoskeletal:       Right elbow: Tenderness found.       Right hand: Decreased strength (decreased hand grip 4/5 ) noted.  Skin: Skin is warm and dry.  Nursing note and vitals reviewed.   Assessment & Plan:   Problem List Items Addressed This Visit    None    Visit Diagnoses    Arthralgia of right elbow    -  Primary   Will evaluate for possibility of gout   If labs unremarkable will refer to orthopedics. Patient is requesting Dr.Ormant      Relevant Medications   acetaminophen-codeine (TYLENOL #3) 300-30 MG tablet   ibuprofen (ADVIL,MOTRIN) 600 MG tablet   Elastic Bandages & Supports (ELBOW SUPPORT) MISC   Other Relevant Orders   Uric Acid   Rheumatoid factor   Sedimentation rate      Meds ordered this encounter  Medications  . acetaminophen-codeine (TYLENOL #3) 300-30 MG tablet  Sig: Take 2 tablets by mouth every 4 (four) hours as needed for severe pain.    Dispense:  60 tablet    Refill:  0    Order Specific Question:   Supervising Provider    Answer:   Tresa Garter [id number]  . ibuprofen (ADVIL,MOTRIN) 600 MG tablet    Sig: Take 1 tablet (600 mg total) by mouth every 8 (eight) hours as needed for moderate pain (Take with food).    Dispense:  30 tablet    Refill:  0    Order Specific Question:   Supervising Provider    Answer:   Tresa Garter [id number]  . Elastic Bandages & Supports (ELBOW SUPPORT) MISC    Sig: APPLY ONE ELBOW SUPPORT TO RIGHT ELBOW FOR STABILITY AND SUPPORT. TO BE FITTED BY MEDICAL SUPPLY.    Dispense:  1 each     Refill:  0    Order Specific Question:   Supervising Provider    Answer:   Tresa Garter [id number]    Follow-up: Return if symptoms worsen or fail to improve.   Alfonse Spruce FNP

## 2016-10-20 NOTE — Patient Instructions (Signed)
Joint Pain Joint pain, which is also called arthralgia, can be caused by many things. Joint pain often goes away when you follow your health care provider's instructions for relieving pain at home. However, joint pain can also be caused by conditions that require further treatment. Common causes of joint pain include:  Bruising in the area of the joint.  Overuse of the joint.  Wear and tear on the joints that occur with aging (osteoarthritis).  Various other forms of arthritis.  A buildup of a crystal form of uric acid in the joint (gout).  Infections of the joint (septic arthritis) or of the bone (osteomyelitis).  Your health care provider may recommend medicine to help with the pain. If your joint pain continues, additional tests may be needed to diagnose your condition. Follow these instructions at home: Watch your condition for any changes. Follow these instructions as directed to lessen the pain that you are feeling.  Take medicines only as directed by your health care provider.  Rest the affected area for as long as your health care provider says that you should. If directed to do so, raise the painful joint above the level of your heart while you are sitting or lying down.  Do not do things that cause or worsen pain.  If directed, apply ice to the painful area: ? Put ice in a plastic bag. ? Place a towel between your skin and the bag. ? Leave the ice on for 20 minutes, 2-3 times per day.  Wear an elastic bandage, splint, or sling as directed by your health care provider. Loosen the elastic bandage or splint if your fingers or toes become numb and tingle, or if they turn cold and blue.  Begin exercising or stretching the affected area as directed by your health care provider. Ask your health care provider what types of exercise are safe for you.  Keep all follow-up visits as directed by your health care provider. This is important.  Contact a health care provider if:  Your  pain increases, and medicine does not help.  Your joint pain does not improve within 3 days.  You have increased bruising or swelling.  You have a fever.  You lose 10 lb (4.5 kg) or more without trying. Get help right away if:  You are not able to move the joint.  Your fingers or toes become numb or they turn cold and blue. This information is not intended to replace advice given to you by your health care provider. Make sure you discuss any questions you have with your health care provider. Document Released: 03/01/2005 Document Revised: 08/01/2015 Document Reviewed: 12/11/2013 Elsevier Interactive Patient Education  2018 [employer] Inc[employer]  

## 2016-10-28 LAB — SEDIMENTATION RATE

## 2016-10-28 LAB — RHEUMATOID FACTOR: Rhuematoid fact SerPl-aCnc: <10 IU/mL (ref 0.0–13.9)

## 2016-10-28 LAB — URIC ACID: URIC ACID: 6.5 mg/dL (ref 3.7–8.6)

## 2016-10-29 ENCOUNTER — Other Ambulatory Visit: Payer: Self-pay | Admitting: Family Medicine

## 2016-10-29 ENCOUNTER — Telehealth: Payer: Self-pay

## 2016-10-29 DIAGNOSIS — M25521 Pain in right elbow: Secondary | ICD-10-CM

## 2016-10-29 NOTE — Telephone Encounter (Signed)
Patient return CMA call  Patient verify DOB   Patient was aware and understood  

## 2016-10-29 NOTE — Telephone Encounter (Signed)
-----   Message from Alfonse Spruce, [place] sent at 10/29/2016  1:27 PM EDT ----- Labs indicate that you do not have gout or rheumatoid arthritis.  You will be referred to orthopedics for second opinion.

## 2016-10-29 NOTE — Telephone Encounter (Signed)
-----   Message from Alfonse Spruce, [place] sent at 10/29/2016  1:27 PM EDT ----- Labs indicate that you do not have gout or rheumatoid arthritis.  You will be referred to orthopedics for second opinion.

## 2016-10-29 NOTE — Telephone Encounter (Signed)
CMA call regarding lab results   Patient wife answer but in the middle of the call it hang up

## 2016-12-03 ENCOUNTER — Emergency Department (HOSPITAL_COMMUNITY)
Admission: EM | Admit: 2016-12-03 | Discharge: 2016-12-03 | Disposition: A | Discharge status: Home / Self Care | Payer: Medicare Other | Attending: Emergency Medicine | Admitting: Emergency Medicine

## 2016-12-03 ENCOUNTER — Emergency Department (HOSPITAL_COMMUNITY): Payer: Medicare Other

## 2016-12-03 DIAGNOSIS — W208XXA Other cause of strike by thrown, projected or falling object, initial encounter: Secondary | ICD-10-CM | POA: Insufficient documentation

## 2016-12-03 DIAGNOSIS — S9031XA Contusion of right foot, initial encounter: Secondary | ICD-10-CM | POA: Diagnosis not present

## 2016-12-03 DIAGNOSIS — M79671 Pain in right foot: Secondary | ICD-10-CM | POA: Diagnosis not present

## 2016-12-03 DIAGNOSIS — M7989 Other specified soft tissue disorders: Secondary | ICD-10-CM | POA: Diagnosis not present

## 2016-12-03 DIAGNOSIS — Y929 Unspecified place or not applicable: Secondary | ICD-10-CM | POA: Diagnosis not present

## 2016-12-03 DIAGNOSIS — Y999 Unspecified external cause status: Secondary | ICD-10-CM | POA: Insufficient documentation

## 2016-12-03 DIAGNOSIS — F17210 Nicotine dependence, cigarettes, uncomplicated: Secondary | ICD-10-CM | POA: Insufficient documentation

## 2016-12-03 DIAGNOSIS — Y939 Activity, unspecified: Secondary | ICD-10-CM | POA: Diagnosis not present

## 2016-12-03 DIAGNOSIS — S99921A Unspecified injury of right foot, initial encounter: Secondary | ICD-10-CM | POA: Diagnosis not present

## 2016-12-03 MED ORDER — IBUPROFEN 200 MG PO TABS
600.0000 mg | ORAL_TABLET | Freq: Once | ORAL | Status: AC
  Administered 2016-12-03: 600 mg via ORAL
  Filled 2016-12-03: qty 3

## 2016-12-03 MED ORDER — DICLOFENAC SODIUM 50 MG PO TBEC: 50.0000 mg | DELAYED_RELEASE_TABLET | Freq: Two times a day (BID) | ORAL | 0 refills | Status: DC

## 2016-12-03 NOTE — ED Notes (Signed)
Bed: [facility] Expected date:  Expected time:  Means of arrival:  Comments: EMS fall 
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
## 2017-04-08 ENCOUNTER — Encounter (HOSPITAL_COMMUNITY): Payer: Self-pay | Admitting: Emergency Medicine

## 2017-04-08 ENCOUNTER — Other Ambulatory Visit: Payer: Self-pay

## 2017-04-08 ENCOUNTER — Ambulatory Visit (HOSPITAL_COMMUNITY)
Admission: EM | Admit: 2017-04-08 | Discharge: 2017-04-08 | Disposition: A | Discharge status: Home / Self Care | Payer: Medicare Other | Attending: Family Medicine | Admitting: Family Medicine

## 2017-04-08 DIAGNOSIS — B9789 Other viral agents as the cause of diseases classified elsewhere: Secondary | ICD-10-CM | POA: Diagnosis not present

## 2017-04-08 DIAGNOSIS — J069 Acute upper respiratory infection, unspecified: Secondary | ICD-10-CM | POA: Diagnosis not present

## 2017-04-08 MED ORDER — PREDNISONE 20 MG PO TABS: ORAL_TABLET | ORAL | 0 refills | Status: DC

## 2017-04-08 MED ORDER — BENZONATATE 100 MG PO CAPS: 100.0000 mg | ORAL_CAPSULE | Freq: Three times a day (TID) | ORAL | 0 refills | Status: DC | PRN

## 2017-04-08 NOTE — ED Provider Notes (Signed)
[place]   [id number] 04/08/17 Arrival Time: 2778   SUBJECTIVE:  [name] is a 47 y.o. male who presents to the urgent care with complaint of coughing, has nasal congestion, and an itchy throat. Was exposed to another family member with similar cough.  Cough began today.  Patient is a smoker.  He has no history of asthma.  He has had no fever or shortness of breath.  Patient's throat feels kind of scratchy.  He has had no nausea or vomiting or diarrhea   Past Medical History:  Diagnosis Date  . CTS (carpal tunnel syndrome)   . Headache   . Hypokalemia    Family History  Problem Relation Age of Onset  . Hypertension Mother   . CVA Mother   . Diabetes Sister   . Hypertension Sister   . Cancer Brother    Social History   Socioeconomic History  . Marital status: Married    Spouse name: [name]   . Number of children: 2  . Years of education: 5  . Highest education level: Not on file  Social Needs  . Financial resource strain: Not on file  . Food insecurity - worry: Not on file  . Food insecurity - inability: Not on file  . Transportation needs - medical: Not on file  . Transportation needs - non-medical: Not on file  Occupational History  . Occupation: [employer]ployed  Tobacco Use  . Smoking status: Current Every Day Smoker    Packs/day: 0.25    Years: 20.00    Pack years: 5.00    Types: Cigarettes  . Smokeless tobacco: Never Used  Substance and Sexual Activity  . Alcohol use: No    Comment: none  since 1997  . Drug use: No  . Sexual activity: Not on file  Other Topics Concern  . Not on file  Social History Narrative   Lives with spouse   Caffeine EUM:PNTIR    No outpatient medications have been marked as taking for the 04/08/17 encounter [facility] Encounter).   Allergies  Allergen Reactions  . Tramadol Hives  . Penicillins Rash      ROS: As per HPI, remainder of ROS negative.   OBJECTIVE:   Vitals:   04/08/17 1747  BP:  140/75  Pulse: 64  Resp: 18  Temp: 98 F (36.7 C)  SpO2: 100%     General appearance: alert; no distress Eyes: PERRL; EOMI; conjunctiva normal HENT: normocephalic; atraumatic; TMs normal, canal normal, external ears normal without trauma; nasal mucosa normal; oral mucosa normal Neck: supple Lungs: clear to auscultation bilaterally Heart: regular rate and rhythm Back: no CVA tenderness Extremities: no cyanosis or edema; symmetrical with no gross deformities Skin: warm and dry Neurologic: normal gait; grossly normal Psychological: alert and cooperative; normal mood and affect      Labs:  Results for orders placed or performed in visit on 10/20/16  Uric Acid  Result Value Ref Range   Uric Acid 6.5 3.7 - 8.6 mg/dL  Rheumatoid factor  Result Value Ref Range   Rhuematoid fact SerPl-aCnc <10.0 0.0 - 13.9 IU/mL  Sedimentation rate  Result Value Ref Range   Sed Rate CANCELED mm/hr    Labs Reviewed - No data to display  No results found.     ASSESSMENT & PLAN:  1. Viral URI with cough     Meds ordered this encounter  Medications  . predniSONE (DELTASONE) 20 MG tablet    Sig: Two daily with food  Dispense:  10 tablet    Refill:  0  . benzonatate (TESSALON) 100 MG capsule    Sig: Take 1-2 capsules (100-200 mg total) by mouth 3 (three) times daily as needed for cough.    Dispense:  40 capsule    Refill:  0    Reviewed expectations re: course of current medical issues. Questions answered. Outlined signs and symptoms indicating need for more acute intervention. Patient verbalized understanding. After Visit Summary given.    Procedures:      Robyn Haber, MD 04/08/17 [phone]

## 2017-04-08 NOTE — ED Triage Notes (Signed)
Pt states he was exposed to a family member with a cough and now hes coughing, has nasal congestion, and an itchy throat.

## 2017-04-10 ENCOUNTER — Emergency Department (HOSPITAL_COMMUNITY)
Admission: EM | Admit: 2017-04-10 | Discharge: 2017-04-10 | Disposition: A | Discharge status: Home / Self Care | Payer: Medicare Other | Attending: Emergency Medicine | Admitting: Emergency Medicine

## 2017-04-10 ENCOUNTER — Encounter (HOSPITAL_COMMUNITY): Payer: Self-pay

## 2017-04-10 ENCOUNTER — Other Ambulatory Visit: Payer: Self-pay

## 2017-04-10 DIAGNOSIS — Z5321 Procedure and treatment not carried out due to patient leaving prior to being seen by health care provider: Secondary | ICD-10-CM | POA: Insufficient documentation

## 2017-04-10 DIAGNOSIS — Y999 Unspecified external cause status: Secondary | ICD-10-CM | POA: Insufficient documentation

## 2017-04-10 DIAGNOSIS — S6992XA Unspecified injury of left wrist, hand and finger(s), initial encounter: Secondary | ICD-10-CM | POA: Diagnosis present

## 2017-04-10 DIAGNOSIS — S61012A Laceration without foreign body of left thumb without damage to nail, initial encounter: Secondary | ICD-10-CM | POA: Diagnosis not present

## 2017-04-10 DIAGNOSIS — Y929 Unspecified place or not applicable: Secondary | ICD-10-CM | POA: Diagnosis not present

## 2017-04-10 DIAGNOSIS — X58XXXA Exposure to other specified factors, initial encounter: Secondary | ICD-10-CM | POA: Insufficient documentation

## 2017-04-10 DIAGNOSIS — Y939 Activity, unspecified: Secondary | ICD-10-CM | POA: Diagnosis not present

## 2017-04-10 NOTE — ED Notes (Signed)
No answer in the lobby, bathroom or outside when called for a room.

## 2017-04-10 NOTE — ED Triage Notes (Signed)
States around 2000 pm last night cut left thumb on soup can bleeding controlled with dressing in place.

## 2017-04-11 NOTE — ED Notes (Addendum)
04/11/2017  Called listed number for follow-up call, person answered and hung up.

## 2017-06-16 ENCOUNTER — Ambulatory Visit: Payer: Medicare Other | Admitting: Podiatry

## 2017-06-30 ENCOUNTER — Ambulatory Visit: Payer: Medicare Other | Admitting: Podiatry

## 2017-07-15 ENCOUNTER — Ambulatory Visit: Payer: Medicare Other | Admitting: Podiatry

## 2017-07-28 ENCOUNTER — Encounter: Payer: Self-pay | Admitting: Podiatry

## 2017-07-28 ENCOUNTER — Ambulatory Visit: Payer: Medicare Other

## 2017-07-28 ENCOUNTER — Ambulatory Visit (INDEPENDENT_AMBULATORY_CARE_PROVIDER_SITE_OTHER): Payer: Medicare Other | Admitting: Podiatry

## 2017-07-28 VITALS — BP 116/61 | HR 86 | Resp 16

## 2017-07-28 DIAGNOSIS — R269 Unspecified abnormalities of gait and mobility: Secondary | ICD-10-CM | POA: Diagnosis not present

## 2017-07-28 DIAGNOSIS — M25572 Pain in left ankle and joints of left foot: Secondary | ICD-10-CM | POA: Diagnosis not present

## 2017-07-28 DIAGNOSIS — B353 Tinea pedis: Secondary | ICD-10-CM | POA: Diagnosis not present

## 2017-07-28 DIAGNOSIS — M779 Enthesopathy, unspecified: Secondary | ICD-10-CM | POA: Diagnosis not present

## 2017-07-28 MED ORDER — CLOTRIMAZOLE-BETAMETHASONE 1-0.05 % EX CREA: TOPICAL_CREAM | CUTANEOUS | 0 refills | Status: DC

## 2017-09-08 ENCOUNTER — Ambulatory Visit (INDEPENDENT_AMBULATORY_CARE_PROVIDER_SITE_OTHER): Payer: Medicare Other | Admitting: Podiatry

## 2017-09-08 ENCOUNTER — Encounter: Payer: Self-pay | Admitting: Podiatry

## 2017-09-08 DIAGNOSIS — B353 Tinea pedis: Secondary | ICD-10-CM | POA: Diagnosis not present

## 2017-09-08 NOTE — Progress Notes (Signed)
  Subjective:  Patient ID: TAAJ HURLBUT, male    DOB: 1970-11-26,  MRN: [id number]  Chief Complaint  Patient presents with  . Tinea Pedis    left foot follow up; pt stated, "seems to be clearing up, no new concerns"  . Tendonitis    left foot follow up; pt stated, "doing good, brace helping, no new concerns"   47 y.o. male returns for the above complaint. States the athlete's foot is doing better.  Objective:  There were no vitals filed for this visit. General AA&O x3. Normal mood and affect.  Vascular Pedal pulses palpable.  Neurologic Epicritic sensation grossly intact.  Dermatologic No open lesions. Skin normal texture and turgor.  Orthopedic: No pain to palpation either foot.   Assessment & Plan:  Patient was evaluated and treated and all questions answered.  Athlete's Foot -Improved.  Tendonitis L -Continue ASO brace. -F/u should symptoms not improve.  Return in about 6 weeks (around 10/20/2017) for Tendonitis, Bilateral.
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
## 2017-10-20 ENCOUNTER — Ambulatory Visit: Payer: Medicare Other | Admitting: Podiatry

## 2017-11-10 ENCOUNTER — Ambulatory Visit: Payer: Medicare Other | Attending: Family Medicine | Admitting: Family Medicine

## 2017-11-10 ENCOUNTER — Other Ambulatory Visit: Payer: Self-pay

## 2017-11-10 ENCOUNTER — Encounter: Payer: Self-pay | Admitting: Family Medicine

## 2017-11-10 VITALS — BP 127/81 | HR 91 | Temp 98.0°F | Resp 18 | Ht 64.0 in | Wt 167.6 lb

## 2017-11-10 DIAGNOSIS — Z8249 Family history of ischemic heart disease and other diseases of the circulatory system: Secondary | ICD-10-CM | POA: Diagnosis not present

## 2017-11-10 DIAGNOSIS — M7989 Other specified soft tissue disorders: Secondary | ICD-10-CM | POA: Insufficient documentation

## 2017-11-10 DIAGNOSIS — F17210 Nicotine dependence, cigarettes, uncomplicated: Secondary | ICD-10-CM | POA: Insufficient documentation

## 2017-11-10 DIAGNOSIS — M25561 Pain in right knee: Secondary | ICD-10-CM

## 2017-11-10 DIAGNOSIS — Z833 Family history of diabetes mellitus: Secondary | ICD-10-CM | POA: Diagnosis not present

## 2017-11-10 DIAGNOSIS — G5600 Carpal tunnel syndrome, unspecified upper limb: Secondary | ICD-10-CM | POA: Diagnosis not present

## 2017-11-10 DIAGNOSIS — Z888 Allergy status to other drugs, medicaments and biological substances status: Secondary | ICD-10-CM | POA: Insufficient documentation

## 2017-11-10 DIAGNOSIS — Z880 Allergy status to penicillin: Secondary | ICD-10-CM | POA: Insufficient documentation

## 2017-11-10 MED ORDER — PREDNISONE 20 MG PO TABS: ORAL_TABLET | ORAL | 1 refills | Status: DC

## 2017-11-10 MED ORDER — SULFAMETHOXAZOLE-TRIMETHOPRIM 800-160 MG PO TABS: 1.0000 | ORAL_TABLET | Freq: Two times a day (BID) | ORAL | 0 refills | Status: AC

## 2017-11-10 MED ORDER — IBUPROFEN 600 MG PO TABS: 600.0000 mg | ORAL_TABLET | Freq: Three times a day (TID) | ORAL | 0 refills | Status: DC | PRN
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
## 2017-11-11 ENCOUNTER — Ambulatory Visit (INDEPENDENT_AMBULATORY_CARE_PROVIDER_SITE_OTHER): Payer: Medicare Other

## 2017-11-11 ENCOUNTER — Ambulatory Visit (INDEPENDENT_AMBULATORY_CARE_PROVIDER_SITE_OTHER): Payer: Medicare Other | Admitting: Orthopaedic Surgery

## 2017-11-11 ENCOUNTER — Encounter (INDEPENDENT_AMBULATORY_CARE_PROVIDER_SITE_OTHER): Payer: Self-pay | Admitting: Orthopaedic Surgery

## 2017-11-11 DIAGNOSIS — M25561 Pain in right knee: Secondary | ICD-10-CM | POA: Diagnosis not present

## 2017-11-11 DIAGNOSIS — G8929 Other chronic pain: Secondary | ICD-10-CM | POA: Diagnosis not present

## 2017-11-11 LAB — CBC WITH DIFFERENTIAL/PLATELET
Basophils Absolute: 0 x10E3/uL (ref 0.0–0.2)
Basos: 0 %
EOS (ABSOLUTE): 0.2 x10E3/uL (ref 0.0–0.4)
Eos: 3 %
Hematocrit: 40.1 % (ref 37.5–51.0)
Hemoglobin: 14 g/dL (ref 13.0–17.7)
Immature Grans (Abs): 0 x10E3/uL (ref 0.0–0.1)
Immature Granulocytes: 0 %
Lymphocytes Absolute: 3.2 x10E3/uL — ABNORMAL HIGH (ref 0.7–3.1)
Lymphs: 55 %
MCH: 31 pg (ref 26.6–33.0)
MCHC: 34.9 g/dL (ref 31.5–35.7)
MCV: 89 fL (ref 79–97)
Monocytes Absolute: 0.3 x10E3/uL (ref 0.1–0.9)
Monocytes: 5 %
Neutrophils Absolute: 2.2 x10E3/uL (ref 1.4–7.0)
Neutrophils: 37 %
Platelets: 193 x10E3/uL (ref 150–450)
RBC: 4.51 x10E6/uL (ref 4.14–5.80)
RDW: 15.1 % (ref 12.3–15.4)
WBC: 5.9 x10E3/uL (ref 3.4–10.8)

## 2017-11-11 LAB — URIC ACID: Uric Acid: 6.7 mg/dL (ref 3.7–8.6)

## 2017-11-11 MED ORDER — LIDOCAINE HCL 1 % IJ SOLN
2.0000 mL | INTRAMUSCULAR | Status: AC | PRN
  Administered 2017-11-11: 2 mL

## 2017-11-11 MED ORDER — BUPIVACAINE HCL 0.25 % IJ SOLN
2.0000 mL | INTRAMUSCULAR | Status: AC | PRN
  Administered 2017-11-11: 2 mL via INTRA_ARTICULAR

## 2017-11-11 MED ORDER — METHYLPREDNISOLONE ACETATE 40 MG/ML IJ SUSP
40.0000 mg | INTRAMUSCULAR | Status: AC | PRN
  Administered 2017-11-11: 40 mg via INTRA_ARTICULAR
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
## 2017-11-16 ENCOUNTER — Telehealth: Payer: Self-pay

## 2017-11-16 NOTE — Telephone Encounter (Signed)
Patient was called, answered, verified DOB. Patient was given most recent lab results. Patient verbalized understanding and had no further questions.

## 2017-11-16 NOTE — Telephone Encounter (Signed)
-----   Message from Antony Blackbird, MD sent at 11/11/2017  2:42 PM EDT ----- Notify patient that his uric acid level was within normal at 6.7 but levels above 6 can cause gout flare-ups. CBC was normal. I would still like for patient to take the antibiotic and prednisone and keep his follow-up appointment with Orthopedics. Please have him call or return if he has increased pain or any concerns.

## 2017-11-25 ENCOUNTER — Ambulatory Visit (INDEPENDENT_AMBULATORY_CARE_PROVIDER_SITE_OTHER): Payer: Medicare Other | Admitting: Orthopaedic Surgery

## 2017-11-25 ENCOUNTER — Encounter (INDEPENDENT_AMBULATORY_CARE_PROVIDER_SITE_OTHER): Payer: Self-pay | Admitting: Orthopaedic Surgery

## 2017-11-25 DIAGNOSIS — G8929 Other chronic pain: Secondary | ICD-10-CM

## 2017-11-25 DIAGNOSIS — M25561 Pain in right knee: Secondary | ICD-10-CM | POA: Diagnosis not present
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
## 2017-12-13 DIAGNOSIS — G5603 Carpal tunnel syndrome, bilateral upper limbs: Secondary | ICD-10-CM | POA: Diagnosis not present

## 2017-12-19 DIAGNOSIS — G5603 Carpal tunnel syndrome, bilateral upper limbs: Secondary | ICD-10-CM | POA: Diagnosis not present

## 2017-12-23 DIAGNOSIS — M7711 Lateral epicondylitis, right elbow: Secondary | ICD-10-CM | POA: Diagnosis not present

## 2017-12-23 DIAGNOSIS — G5603 Carpal tunnel syndrome, bilateral upper limbs: Secondary | ICD-10-CM | POA: Diagnosis not present

## 2018-01-13 ENCOUNTER — Encounter (HOSPITAL_COMMUNITY): Payer: Self-pay | Admitting: Nurse Practitioner

## 2018-01-13 ENCOUNTER — Encounter (HOSPITAL_COMMUNITY): Payer: Self-pay | Admitting: Emergency Medicine

## 2018-01-13 ENCOUNTER — Emergency Department (HOSPITAL_COMMUNITY)
Admission: EM | Admit: 2018-01-13 | Discharge: 2018-01-14 | Disposition: A | Discharge status: Home / Self Care | Payer: Medicare Other | Attending: Emergency Medicine | Admitting: Emergency Medicine

## 2018-01-13 ENCOUNTER — Ambulatory Visit (INDEPENDENT_AMBULATORY_CARE_PROVIDER_SITE_OTHER)
Admission: EM | Admit: 2018-01-13 | Discharge: 2018-01-13 | Disposition: A | Discharge status: Home / Self Care | Payer: Medicare Other | Source: Home / Self Care | Attending: Family Medicine | Admitting: Family Medicine

## 2018-01-13 DIAGNOSIS — F17210 Nicotine dependence, cigarettes, uncomplicated: Secondary | ICD-10-CM | POA: Insufficient documentation

## 2018-01-13 DIAGNOSIS — K0889 Other specified disorders of teeth and supporting structures: Secondary | ICD-10-CM

## 2018-01-13 MED ORDER — AMOXICILLIN-POT CLAVULANATE 875-125 MG PO TABS: 1.0000 | ORAL_TABLET | Freq: Two times a day (BID) | ORAL | 0 refills | Status: DC

## 2018-01-13 MED ORDER — IBUPROFEN 800 MG PO TABS: 800.0000 mg | ORAL_TABLET | Freq: Three times a day (TID) | ORAL | 0 refills | Status: DC

## 2018-01-13 MED ORDER — OXYCODONE-ACETAMINOPHEN 5-325 MG PO TABS
1.0000 | ORAL_TABLET | Freq: Once | ORAL | Status: AC
  Administered 2018-01-14: 1 via ORAL
  Filled 2018-01-13: qty 1

## 2018-01-13 MED ORDER — HYDROCHLOROTHIAZIDE 12.5 MG PO CAPS
25.0000 mg | ORAL_CAPSULE | Freq: Once | ORAL | Status: AC
  Administered 2018-01-14: 25 mg via ORAL
  Filled 2018-01-13: qty 2
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
## 2018-01-13 NOTE — ED Triage Notes (Signed)
Pt c/o dental pain on front tooth, pt has missing teeth, poor dentition.

## 2018-01-13 NOTE — Discharge Instructions (Addendum)
Please use dental resource to contact offices to seek permenant treatment/relief.  ° °Today we have given you an antibiotic. This should help with pain as any infection is cleared.  ° °For pain please take 600mg-800mg of Ibuprofen every 8 hours, take with 1000 mg of Tylenol Extra strength every 8 hours. These are safe to take together. Please take with food.  ° °Please return if you start to experience significant swelling of your face, experiencing fever. °

## 2018-01-13 NOTE — ED Triage Notes (Signed)
Pt is c/o throbbing dental pain.
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
## 2018-01-13 NOTE — Discharge Instructions (Signed)
Continue taking ibuprofen and Tylenol as prescribed, on the clock for your pain.  Continue taking Augmentin as prescribed.  It is important that you see your dentist on Tuesday to have your dental problems taken care of.  This will be the definitive treatment for your problem.  Please return to emergency department if you develop any new or worsening symptoms.  Please follow-up with your doctor for recheck and further management of your blood pressure next week.

## 2018-01-14 ENCOUNTER — Encounter (HOSPITAL_COMMUNITY): Payer: Self-pay | Admitting: Emergency Medicine

## 2018-01-14 ENCOUNTER — Ambulatory Visit (INDEPENDENT_AMBULATORY_CARE_PROVIDER_SITE_OTHER)
Admission: EM | Admit: 2018-01-14 | Discharge: 2018-01-14 | Disposition: A | Discharge status: Home / Self Care | Payer: Medicare Other | Source: Home / Self Care

## 2018-01-14 ENCOUNTER — Other Ambulatory Visit: Payer: Self-pay

## 2018-01-14 DIAGNOSIS — K0889 Other specified disorders of teeth and supporting structures: Secondary | ICD-10-CM | POA: Diagnosis not present

## 2018-01-14 DIAGNOSIS — L03211 Cellulitis of face: Secondary | ICD-10-CM | POA: Diagnosis not present

## 2018-01-14 MED ORDER — LIDOCAINE HCL (PF) 1 % IJ SOLN
INTRAMUSCULAR | Status: AC
  Filled 2018-01-14: qty 2

## 2018-01-14 MED ORDER — PREDNISONE 10 MG PO TABS: ORAL_TABLET | ORAL | 0 refills | Status: DC

## 2018-01-14 MED ORDER — CEFTRIAXONE SODIUM 1 G IJ SOLR
0.5000 g | Freq: Once | INTRAMUSCULAR | Status: AC
  Administered 2018-01-14: 0.5 g via INTRAMUSCULAR

## 2018-01-14 MED ORDER — HYDROCODONE-ACETAMINOPHEN 5-325 MG PO TABS
1.0000 | ORAL_TABLET | Freq: Once | ORAL | Status: AC
  Administered 2018-01-14: 1 via ORAL

## 2018-01-14 MED ORDER — CEFTRIAXONE SODIUM 1 G IJ SOLR
INTRAMUSCULAR | Status: AC
  Filled 2018-01-14: qty 10

## 2018-01-14 MED ORDER — DOXYCYCLINE HYCLATE 100 MG PO TABS: 100.0000 mg | ORAL_TABLET | Freq: Two times a day (BID) | ORAL | 0 refills | Status: AC

## 2018-01-14 MED ORDER — HYDROCODONE-ACETAMINOPHEN 5-325 MG PO TABS
ORAL_TABLET | ORAL | Status: AC
  Filled 2018-01-14: qty 1

## 2018-01-14 MED ORDER — HYDROCHLOROTHIAZIDE 25 MG PO TABS: 25.0000 mg | ORAL_TABLET | Freq: Every day | ORAL | 0 refills | Status: DC
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
## 2018-01-14 NOTE — ED Notes (Signed)
PT DISCHARGED. INSTRUCTIONS AND PRESCRIPTION GIVEN. AAOX4. PT IN NO APPARENT DISTRESS WITH SEVERE PAIN. THE OPPORTUNITY TO ASK QUESTIONS WAS PROVIDED. 

## 2018-01-14 NOTE — ED Triage Notes (Signed)
Patient has had multiple visits with ed for med refill and dental pain treatment.    Left side of face is swollen and painful

## 2018-01-14 NOTE — Discharge Instructions (Signed)
Your wife will drive you home as I given you a dose of Norco.    I am concerned that you developed cellulitis after your dental infection.  You may stop the Augmentin at this time.   I would like for you to start doxycycline.  I am giving you a 10-day supply of this.   Please ensure that you start taking probiotics such as yogurt due to the number antibiotics that you have been on. You may also start a prednisone taper which to help with the swelling.  It is preferable if you start this tomorrow as prednisone can interfere with your sleep however if your preference to go ahead and start tonight, you may do so however know that you may not be able to sleep well .  Please call your primary care first thing on [facility] as you need to be closely followed to ensure that the skin infection is improving. Is also very important as we discussed, to monitor your blood pressure.  Please pick up the hydrochlorothiazide as we discussed today.  Any signs of shortness of breath, chest pain or worsening facial swelling or pain, please go immediately to the nearest emergency room.

## 2018-01-24 DIAGNOSIS — G5603 Carpal tunnel syndrome, bilateral upper limbs: Secondary | ICD-10-CM | POA: Diagnosis not present

## 2018-01-24 DIAGNOSIS — M7711 Lateral epicondylitis, right elbow: Secondary | ICD-10-CM | POA: Diagnosis not present

## 2018-02-22 ENCOUNTER — Other Ambulatory Visit: Payer: Self-pay | Admitting: Family Medicine

## 2018-02-22 ENCOUNTER — Telehealth: Payer: Self-pay | Admitting: Family Medicine

## 2018-02-22 DIAGNOSIS — I10 Essential (primary) hypertension: Secondary | ICD-10-CM

## 2018-02-22 MED ORDER — HYDROCHLOROTHIAZIDE 25 MG PO TABS: 25.0000 mg | ORAL_TABLET | Freq: Every day | ORAL | 0 refills | Status: DC

## 2018-02-22 NOTE — Telephone Encounter (Signed)
Patient called because he was concerned about his hydrochlorothiazide medication. When patient went to the pharmacy to pick up his prescription he was told to contact PCP office because the medication was listed in the Patient's son name.   Patient significant other states patient was still able to get the medication although it was listed and under the sons name. Patient would like to know what should be done so it can be filled under his name.  Please follow up.

## 2018-02-22 NOTE — Telephone Encounter (Signed)
I called and spoke with the patient and his significant other, they were concerned about refilling the HCTZ prior to the patients next appt on 03/16/17 and that it would be filled at the right pharmacy under the right profile this time. I confirmed they want to use the [facility] on [address] and told the patient we would include a note to the pharmacy to make sure it is filled under his profile. The error that occurred was on the pharmacy's behalf and the patient was advised to call my number [phone] if there were any further issues.   Dr. Chapman Fitch: this patient was last seen by you on 11/10/17 and has an appt coming up on 03/16/17. The most recent RX for HCTZ came from a provider in the ER, please refill if appropriate.

## 2018-02-22 NOTE — Progress Notes (Signed)
See phone encounter. Patient requested a refill of HCTZ as he will be out before his appointment in January. Medication refilled with a 30 day supply and will discuss with patient at follow-up if medication should be changed as patient also with gout which can worsened with the use of a diuretic medication.

## 2018-02-22 NOTE — Telephone Encounter (Signed)
Medication was refilled for 30 days.

## 2018-03-16 ENCOUNTER — Encounter: Payer: Self-pay | Admitting: Family Medicine

## 2018-03-16 ENCOUNTER — Ambulatory Visit: Payer: Medicare Other | Attending: Family Medicine | Admitting: Family Medicine

## 2018-03-16 VITALS — BP 115/64 | HR 65 | Temp 98.2°F | Resp 18 | Ht 65.0 in | Wt 165.0 lb

## 2018-03-16 DIAGNOSIS — F17210 Nicotine dependence, cigarettes, uncomplicated: Secondary | ICD-10-CM | POA: Diagnosis not present

## 2018-03-16 DIAGNOSIS — E782 Mixed hyperlipidemia: Secondary | ICD-10-CM | POA: Diagnosis not present

## 2018-03-16 DIAGNOSIS — Z880 Allergy status to penicillin: Secondary | ICD-10-CM | POA: Diagnosis not present

## 2018-03-16 DIAGNOSIS — Z8249 Family history of ischemic heart disease and other diseases of the circulatory system: Secondary | ICD-10-CM | POA: Diagnosis not present

## 2018-03-16 DIAGNOSIS — Z885 Allergy status to narcotic agent status: Secondary | ICD-10-CM | POA: Insufficient documentation

## 2018-03-16 DIAGNOSIS — Z791 Long term (current) use of non-steroidal anti-inflammatories (NSAID): Secondary | ICD-10-CM

## 2018-03-16 DIAGNOSIS — Z79899 Other long term (current) drug therapy: Secondary | ICD-10-CM | POA: Diagnosis not present

## 2018-03-16 DIAGNOSIS — I10 Essential (primary) hypertension: Secondary | ICD-10-CM | POA: Diagnosis present

## 2018-03-16 DIAGNOSIS — Z809 Family history of malignant neoplasm, unspecified: Secondary | ICD-10-CM | POA: Insufficient documentation

## 2018-03-16 DIAGNOSIS — E559 Vitamin D deficiency, unspecified: Secondary | ICD-10-CM

## 2018-03-16 DIAGNOSIS — Z833 Family history of diabetes mellitus: Secondary | ICD-10-CM | POA: Diagnosis not present

## 2018-03-16 DIAGNOSIS — G5603 Carpal tunnel syndrome, bilateral upper limbs: Secondary | ICD-10-CM

## 2018-03-16 MED ORDER — HYDROCHLOROTHIAZIDE 25 MG PO TABS: 25.0000 mg | ORAL_TABLET | Freq: Every day | ORAL | 5 refills | Status: DC

## 2018-03-16 MED ORDER — IBUPROFEN 800 MG PO TABS: 800.0000 mg | ORAL_TABLET | Freq: Three times a day (TID) | ORAL | 3 refills | Status: DC

## 2018-03-16 NOTE — Patient Instructions (Signed)
Please return for fasting lab visit.  Please do not have anything to eat for 8 hours before your appointment.  You may have water or black coffee to drink if needed.  You can eat after having your blood work done.  Please also schedule an appointment for annual well exam.
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
## 2018-03-28 ENCOUNTER — Ambulatory Visit (INDEPENDENT_AMBULATORY_CARE_PROVIDER_SITE_OTHER): Payer: Medicare Other | Admitting: Orthopaedic Surgery

## 2018-04-06 ENCOUNTER — Encounter: Payer: Medicare Other | Admitting: Family Medicine

## 2018-04-25 ENCOUNTER — Encounter: Payer: Medicare Other | Admitting: Family Medicine

## 2018-07-07 ENCOUNTER — Telehealth: Payer: Self-pay | Admitting: *Deleted

## 2018-07-07 ENCOUNTER — Encounter: Payer: Self-pay | Admitting: Family Medicine

## 2018-07-07 ENCOUNTER — Ambulatory Visit: Payer: Medicare Other | Attending: Family Medicine | Admitting: Family Medicine

## 2018-07-07 ENCOUNTER — Other Ambulatory Visit: Payer: Self-pay

## 2018-07-07 DIAGNOSIS — M5412 Radiculopathy, cervical region: Secondary | ICD-10-CM

## 2018-07-07 DIAGNOSIS — R42 Dizziness and giddiness: Secondary | ICD-10-CM | POA: Diagnosis not present

## 2018-07-07 DIAGNOSIS — G44319 Acute post-traumatic headache, not intractable: Secondary | ICD-10-CM

## 2018-07-07 MED ORDER — PREDNISONE 20 MG PO TABS: ORAL_TABLET | ORAL | 0 refills | Status: DC

## 2018-07-07 MED ORDER — OXYCODONE HCL 5 MG PO TABS: 5.0000 mg | ORAL_TABLET | Freq: Two times a day (BID) | ORAL | 0 refills | Status: AC | PRN
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
## 2018-07-07 NOTE — Telephone Encounter (Signed)
Called patient and LMOM . Staff need to confirm with patient if he will be getting his Doppler ordered with the [facility] like he discussed with provider during his visit or if he need office to try to get [employer] for his Doppler. Office number was left on machine.

## 2018-07-07 NOTE — Telephone Encounter (Signed)
Spoke with patient and he stated he will see if [facility] can do the Doppler dr. Chapman Fitch talked with him about during his visit today.

## 2018-07-10 ENCOUNTER — Encounter (HOSPITAL_COMMUNITY): Payer: Self-pay

## 2018-07-10 ENCOUNTER — Other Ambulatory Visit: Payer: Self-pay

## 2018-07-10 ENCOUNTER — Telehealth: Payer: Self-pay | Admitting: *Deleted

## 2018-07-10 ENCOUNTER — Ambulatory Visit (HOSPITAL_COMMUNITY)
Admission: RE | Admit: 2018-07-10 | Discharge: 2018-07-10 | Disposition: A | Discharge status: Home / Self Care | Payer: Medicare Other | Source: Ambulatory Visit | Attending: Family Medicine | Admitting: Family Medicine

## 2018-07-10 DIAGNOSIS — G44319 Acute post-traumatic headache, not intractable: Secondary | ICD-10-CM | POA: Insufficient documentation

## 2018-07-10 DIAGNOSIS — R42 Dizziness and giddiness: Secondary | ICD-10-CM | POA: Insufficient documentation

## 2018-07-10 NOTE — Telephone Encounter (Signed)
Called and spoke with patient and informed him with his procedure schedule. Informed patient that his appointment for his CT is at [facility] at [place] today and his MRI is at [facility] for Wednesday 07-12-18 at 2 pm.

## 2018-07-12 ENCOUNTER — Other Ambulatory Visit: Payer: Self-pay

## 2018-07-12 ENCOUNTER — Ambulatory Visit (HOSPITAL_COMMUNITY)
Admission: RE | Admit: 2018-07-12 | Discharge: 2018-07-12 | Disposition: A | Discharge status: Home / Self Care | Payer: Medicare Other | Source: Ambulatory Visit | Attending: Family Medicine | Admitting: Family Medicine

## 2018-07-12 DIAGNOSIS — M5412 Radiculopathy, cervical region: Secondary | ICD-10-CM | POA: Insufficient documentation

## 2018-07-14 ENCOUNTER — Ambulatory Visit (INDEPENDENT_AMBULATORY_CARE_PROVIDER_SITE_OTHER): Payer: Medicare Other | Admitting: Orthopaedic Surgery

## 2018-07-14 ENCOUNTER — Telehealth: Payer: Self-pay | Admitting: Family Medicine

## 2018-07-14 ENCOUNTER — Ambulatory Visit (INDEPENDENT_AMBULATORY_CARE_PROVIDER_SITE_OTHER): Payer: Self-pay

## 2018-07-14 ENCOUNTER — Encounter (INDEPENDENT_AMBULATORY_CARE_PROVIDER_SITE_OTHER): Payer: Self-pay | Admitting: Orthopaedic Surgery

## 2018-07-14 ENCOUNTER — Other Ambulatory Visit: Payer: Self-pay

## 2018-07-14 VITALS — BP 156/88 | HR 73 | Ht 64.25 in | Wt 154.0 lb

## 2018-07-14 DIAGNOSIS — M7541 Impingement syndrome of right shoulder: Secondary | ICD-10-CM

## 2018-07-14 DIAGNOSIS — G8929 Other chronic pain: Secondary | ICD-10-CM

## 2018-07-14 DIAGNOSIS — G5691 Unspecified mononeuropathy of right upper limb: Secondary | ICD-10-CM

## 2018-07-14 DIAGNOSIS — M542 Cervicalgia: Secondary | ICD-10-CM | POA: Diagnosis not present

## 2018-07-14 DIAGNOSIS — M25511 Pain in right shoulder: Secondary | ICD-10-CM | POA: Diagnosis not present

## 2018-07-14 MED ORDER — BUPIVACAINE HCL 0.25 % IJ SOLN
4.0000 mL | INTRAMUSCULAR | Status: AC | PRN
  Administered 2018-07-14: 4 mL via INTRA_ARTICULAR

## 2018-07-14 MED ORDER — METHYLPREDNISOLONE ACETATE 40 MG/ML IJ SUSP
40.0000 mg | INTRAMUSCULAR | Status: AC | PRN
  Administered 2018-07-14: 40 mg via INTRA_ARTICULAR

## 2018-07-14 MED ORDER — LIDOCAINE HCL 1 % IJ SOLN
3.0000 mL | INTRAMUSCULAR | Status: AC | PRN
  Administered 2018-07-14: 10:00:00 3 mL
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
## 2018-07-14 NOTE — Telephone Encounter (Signed)
Pt had a missed call he thinks it was his CMA they lvm to call back please follow up

## 2018-08-11 ENCOUNTER — Ambulatory Visit: Payer: Medicaid Other | Admitting: Orthopaedic Surgery

## 2018-08-17 ENCOUNTER — Telehealth: Payer: Self-pay | Admitting: *Deleted

## 2018-08-17 NOTE — Telephone Encounter (Signed)
Noted thanks °

## 2018-08-17 NOTE — Telephone Encounter (Signed)
I called the pt and LVM asking for a call back today if possible to confirm if he is coming into the office for his appt on Monday 6/8 @ 8;30 arrival 15-30 minutes prior. I advised of requirement of mask and covid19 screening questions including fever, cough, SOB, exposure and advised he would need to r/s to err on side of caution if any of these apply to pt. I left the office number in message and advised if we do not hear back from him to confirm, we will cancel the appt and he will then need to reschedule.   COVID19 risk score 2.

## 2018-08-17 NOTE — Telephone Encounter (Signed)
Pt returned call, pt was advised to wear a mask during visit , pt has passed screening questions

## 2018-08-21 ENCOUNTER — Other Ambulatory Visit: Payer: Self-pay

## 2018-08-21 ENCOUNTER — Ambulatory Visit (INDEPENDENT_AMBULATORY_CARE_PROVIDER_SITE_OTHER): Payer: Medicare Other | Admitting: Neurology

## 2018-08-21 ENCOUNTER — Encounter: Payer: Self-pay | Admitting: Neurology

## 2018-08-21 VITALS — BP 153/82 | HR 69 | Temp 97.1°F | Ht 64.75 in | Wt 159.0 lb

## 2018-08-21 DIAGNOSIS — M542 Cervicalgia: Secondary | ICD-10-CM

## 2018-08-21 DIAGNOSIS — R200 Anesthesia of skin: Secondary | ICD-10-CM | POA: Diagnosis not present

## 2018-08-21 DIAGNOSIS — M5481 Occipital neuralgia: Secondary | ICD-10-CM | POA: Diagnosis not present

## 2018-08-21 DIAGNOSIS — R202 Paresthesia of skin: Secondary | ICD-10-CM

## 2018-08-21 DIAGNOSIS — M7918 Myalgia, other site: Secondary | ICD-10-CM | POA: Diagnosis not present

## 2018-08-21 MED ORDER — TIZANIDINE HCL 4 MG PO TABS: 4.0000 mg | ORAL_TABLET | Freq: Three times a day (TID) | ORAL | 6 refills | Status: DC | PRN

## 2018-08-21 NOTE — Patient Instructions (Addendum)
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 It happens when the nerve that extends from the back of your upper arm to your forearm (radial nerve) gets squeezed (compressed). The condition is usually caused by inflammation and swelling, an injury, or a tumor that puts pressure on the nerve. What are the causes? This condition may be caused by:  Repeatedly using your forearm too much, especially to twist or extend your arm.  Muscle inflammation.  An injury.  A mass of nerve tissue (ganglia).  Noncancerous fatty tumors (lipomas).  A bone tumor. What increases the risk? The following factors may make you more likely to develop this condition:  Playing sports that involve moving the forearm a lot, such as tennis.  Having a job that involves using the forearm a lot, such as some factory jobs. What are the signs or symptoms? Symptoms of this condition include:  Aching or stabbing pain on the top of the forearm, back of the hand, or side of the elbow.  Pain while straightening the wrist or fingers.  Weakness in the forearm muscles and wrist. How is this diagnosed? This condition may be diagnosed based on:  Your symptoms and medical history.  A physical exam. You may be asked to move your hand, fingers, wrist, and arm in certain ways so your health care provider can find the source of your pain.  Tests to rule out other conditions. These tests may include: ? An electromyogram (EMG). This test can show how well the radial nerve is working. ? A nerve conduction study. This test  measures how well electrical signals pass through your nerves. ? An MRI. This test checks for causes of nerve problems, such as scarring from an injury, pressure on a nerve, or a tumor. ? An ultrasound. This test can show certain injuries, such as tears to ligaments or tendons. How is this treated? Treatment for this condition may include:  Avoiding activities that cause your symptoms to get worse or flare up.  Taking steroids or anti-inflammatory medicines, such as ibuprofen, to help with symptoms.  Wearing a splint or brace for support until your symptoms go away.  Doing exercises to regain strength and maintain movement and range of motion in your hand and arm.  Surgery to take pressure off the radial nerve. Usually, surgery is not needed unless other treatments fail and symptoms last longer than 3 months. Follow these instructions at home: If you have a splint or brace:  Wear the splint or brace as told by your health care provider. Remove it only as told by your health care provider.  Loosen the splint or brace if your fingers tingle, become numb, or turn cold and blue.  Keep the splint or brace clean.  If the splint or brace is not waterproof: ? Do not let it get wet. ? Cover it with a watertight covering when you take a bath or shower. Activity  Rest your arm from activities that may be causing your pain.  Return to your normal activities as told by your health care provider. Ask your health care provider what activities are safe for you.  Do exercises  as told by your health care provider. Managing pain and swelling      If directed, put ice on the painful area. ? Put ice in a plastic bag. ? Place a towel between your skin and the bag. ? Leave the ice on for 20 minutes, 2-3 times a day.  If directed, apply heat to the affected area as often as told by your health care provider. Use the heat source that your health care provider recommends, such as a moist heat pack  or a heating pad. ? Place a towel between your skin and the heat source. ? Leave the heat on for 20-30 minutes. ? Remove the heat if your skin turns bright red. This is especially important if you are unable to feel pain, heat, or cold. You may have a greater risk of getting burned. General instructions  Take over-the-counter and prescription medicines only as told by your health care provider.  Do not use any products that contain nicotine or tobacco, such as cigarettes and e-cigarettes. If you need help quitting, ask your health care provider.  Keep all follow-up visits as told by your health care provider. This is important. How is this prevented? Take these steps to help prevent reinjury:  Warm up and stretch before being active.  Cool down and stretch after being active.  Give your body time to rest between periods of activity.  Make sure you use equipment that fits you. Use protective gear, such as elbow pads.  Be safe and responsible while being active. This will help you avoid falls.  Do at least 150 minutes of moderate-intensity exercise each week, such as brisk walking or water aerobics.  Maintain physical fitness, including: ? Strength. ? Flexibility. ? Cardiovascular fitness. ? Endurance. Contact a health care provider if:  Your symptoms do not improve within 2 weeks.  Your symptoms get worse.  Your wrist gets weak or droopy. Get help right away if:  Your pain is severe.  You cannot move part of your hand or arm. Summary  Radial tunnel syndrome is a condition that causes pain in the hand and arm.  The condition is usually caused by inflammation and swelling, an injury, or a tumor that puts pressure on the nerve.  Treatment may include avoiding activities that cause your symptoms to get worse and taking medicines.  Return to your normal activities as told by your health care provider. Ask your health care provider what activities are safe for you. This  information is not intended to replace advice given to you by your health care provider. Make sure you discuss any questions you have with your health care provider. Document Released: 03/01/2005 Document Revised: 07/07/2017 Document Reviewed: 07/07/2017 Elsevier Interactive Patient Education  2019 [place] is a test to check how well your muscles and nerves are working. This procedure includes the combined use of electromyogram (EMG) and nerve conduction study (NCS). EMG is used to look for muscular disorders. NCS, which is also called electroneurogram, measures how well your nerves are controlling your muscles. The procedures are usually performed together to check if your muscles and nerves are healthy. If the reaction to testing is abnormal, this can indicate disease or injury, such as peripheral nerve damage. Tell a health care provider about:  Any allergies you have.  All medicines you are taking, including vitamins, herbs, eye drops, creams, and over-the-counter medicines.  Any problems you or family members have had with anesthetic medicines.  Any blood disorders  you have.  Any surgeries you have had.  Any medical conditions you have.  Any pacemaker you have. What are the risks? Generally, this is a safe procedure. However, problems may occur, including:  Infection where the electrodes were inserted.  Bleeding. What happens before the procedure?  Ask your health care provider about: ? Changing or stopping your regular medicines. This is especially important if you are taking diabetes medicines or blood thinners. ? Taking medicines such as aspirin and ibuprofen. These medicines can thin your blood. Do not take these medicines before your procedure if your health care provider instructs you not to.  Your health care provider may ask you to avoid: ? Caffeine, such as coffee and tea. ? Nicotine. This includes cigarettes and  anything with tobacco.  Do not use lotions or creams on the same day that you will be having the procedure. What happens during the procedure? For EMG:  Your health care provider will ask you to stay in a position so that he or she can access the muscle that will be studied. You may be standing, sitting down, or lying down.  You may be given a medicine that numbs the area (local anesthetic).  A very thin needle that has an electrode on it will be inserted into your muscle.  Another small electrode will be placed on your skin near the muscle.  Your health care provider will ask you to continue to remain still.  The electrodes will send a signal that tells about the electrical activity of your muscles. You may see this on a monitor or hear it in the room.  After your muscles have been studied at rest, your health care provider will ask you to contract or flex your muscles. The electrodes will send a signal that tells about the electrical activity of your muscles.  Your health care provider will remove the electrodes and the electrode needles when the procedure is finished. The procedure may vary among health care providers and hospitals. For NCS:  An electrode that records your nerve activity (recording electrode) will be placed on your skin by the muscle that is being studied.  An electrode that is used as a reference (reference electrode) will be placed near the recording electrode.  A paste or gel will be applied to your skin between the recording electrode and the reference electrode.  Your nerve will be stimulated with a mild shock. Your health care provider will measure how much time it takes for your muscle to react.  Your health care provider will remove the electrodes and the gel when the procedure is finished. The procedure may vary among health care providers and hospitals. What happens after the procedure?  It is your responsibility to obtain your test results. Ask your  health care provider or the department performing the test when and how you will get your results.  Your health care provider may: ? Give you medicines for any pain. ? Monitor the insertion sites to make sure that they stop bleeding. This information is not intended to replace advice given to you by your health care provider. Make sure you discuss any questions you have with your health care provider. Document Released: 07/02/2004 Document Revised: 08/07/2015 Document Reviewed: 04/22/2014 Elsevier Interactive Patient Education  2019 [place].  Tizanidine tablets or capsules What is this medicine? TIZANIDINE (tye ZAN i deen) helps to relieve muscle spasms. It may be used to help in the treatment of multiple sclerosis and spinal cord injury. This  medicine may be used for other purposes; ask your health care provider or pharmacist if you have questions. COMMON BRAND NAME(S): Zanaflex What should I tell my health care provider before I take this medicine? They need to know if you have any of these conditions: -kidney disease -liver disease -low blood pressure -mental disorder -an unusual or allergic reaction to tizanidine, other medicines, lactose (tablets only), foods, dyes, or preservatives -pregnant or trying to get pregnant -breast-feeding How should I use this medicine? Take this medicine by mouth with a full glass of water. Take this medicine on an empty stomach, at least 30 minutes before or 2 hours after food. Do not take with food unless you talk with your doctor. Follow the directions on the prescription label. Take your medicine at regular intervals. Do not take your medicine more often than directed. Do not stop taking except on your doctor's advice. Suddenly stopping the medicine can be very dangerous. Talk to your pediatrician regarding the use of this medicine in children. Patients over 32 years old may have a stronger reaction and need a smaller dose. Overdosage: If you  think you have taken too much of this medicine contact a poison control center or emergency room at once. NOTE: This medicine is only for you. Do not share this medicine with others. What if I miss a dose? If you miss a dose, take it as soon as you can. If it is almost time for your next dose, take only that dose. Do not take double or extra doses. What may interact with this medicine? Do not take this medicine with any of the following medications: -ciprofloxacin -fluvoxamine -narcotic medicines for cough -thiabendazole This medicine may also interact with the following medications: -acyclovir -alcohol -antihistamines for allergy, cough, and cold -baclofen -certain medicines for anxiety or sleep -certain medicines for blood pressure, heart disease, irregular heartbeat -certain medicines for depression like amitriptyline, fluoxetine, sertraline -certain medicines for seizures like phenobarbital, primidone -certain medicines for stomach problems like cimetidine, famotidine -male hormones, like estrogens or progestins and birth control pills, patches, rings, or injections -general anesthetics like halothane, isoflurane, methoxyflurane, propofol -local anesthetics like lidocaine, pramoxine, tetracaine -medicines that relax muscles for surgery -narcotic medicines for pain -phenothiazines like chlorpromazine, mesoridazine, prochlorperazine -ticlopidine -zileuton This list may not describe all possible interactions. Give your health care provider a list of all the medicines, herbs, non-prescription drugs, or dietary supplements you use. Also tell them if you smoke, drink alcohol, or use illegal drugs. Some items may interact with your medicine. What should I watch for while using this medicine? Tell your doctor or health care professional if your symptoms do not start to get better or if they get worse. You may get drowsy or dizzy. Do not drive, use machinery, or do anything that needs  mental alertness until you know how this medicine affects you. Do not stand or sit up quickly, especially if you are an older patient. This reduces the risk of dizzy or fainting spells. Alcohol may interfere with the effect of this medicine. Avoid alcoholic drinks. If you are taking another medicine that also causes drowsiness, you may have more side effects. Give your health care provider a list of all medicines you use. Your doctor will tell you how much medicine to take. Do not take more medicine than directed. Call emergency for help if you have problems breathing or unusual sleepiness. Your mouth may get dry. Chewing sugarless gum or sucking hard candy, and drinking plenty of water  may help. Contact your doctor if the problem does not go away or is severe. What side effects may I notice from receiving this medicine? Side effects that you should report to your doctor or health care professional as soon as possible: -allergic reactions like skin rash, itching or hives, swelling of the face, lips, or tongue -breathing problems -hallucinations -signs and symptoms of liver injury like dark yellow or brown urine; general ill feeling or flu-like symptoms; light-colored stools; loss of appetite; nausea; right upper quadrant belly pain; unusually weak or tired; yellowing of the eyes or skin -signs and symptoms of low blood pressure like dizziness; feeling faint or lightheaded, falls; unusually weak or tired -unusually slow heartbeat -unusually weak or tired Side effects that usually do not require medical attention (report to your doctor or health care professional if they continue or are bothersome): -blurred vision -constipation -dizziness -dry mouth -tiredness This list may not describe all possible side effects. Call your doctor for medical advice about side effects. You may report side effects to FDA at 1-800-FDA-1088. Where should I keep my medicine? Keep out of the reach of children. Store at  room temperature between 15 and 30 degrees C (59 and 86 degrees F). Throw away any unused medicine after the expiration date. NOTE: This sheet is a summary. It may not cover all possible information. If you have questions about this medicine, talk to your doctor, pharmacist, or health care provider.  2019 Elsevier/Gold Standard (2016-12-14 13:33:29)
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
## 2018-08-22 ENCOUNTER — Ambulatory Visit (INDEPENDENT_AMBULATORY_CARE_PROVIDER_SITE_OTHER): Payer: Medicare Other | Admitting: Orthopaedic Surgery

## 2018-08-22 ENCOUNTER — Encounter: Payer: Self-pay | Admitting: Orthopaedic Surgery

## 2018-08-22 ENCOUNTER — Other Ambulatory Visit: Payer: Self-pay

## 2018-08-22 VITALS — Ht 64.75 in | Wt 159.0 lb

## 2018-08-22 DIAGNOSIS — M7711 Lateral epicondylitis, right elbow: Secondary | ICD-10-CM
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
## 2018-08-25 ENCOUNTER — Ambulatory Visit: Payer: Medicare Other | Admitting: Physical Therapy

## 2018-08-29 ENCOUNTER — Telehealth: Payer: Self-pay | Admitting: Family Medicine

## 2018-08-29 NOTE — Telephone Encounter (Signed)
Patient wife called stating that the patient woke up this morning complaining that he could not hear. Please follow up.
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
## 2018-08-30 ENCOUNTER — Other Ambulatory Visit: Payer: Self-pay

## 2018-08-30 ENCOUNTER — Encounter: Payer: Self-pay | Admitting: Physical Therapy

## 2018-08-30 ENCOUNTER — Ambulatory Visit: Payer: Medicare Other | Attending: Neurology | Admitting: Physical Therapy

## 2018-08-30 DIAGNOSIS — M542 Cervicalgia: Secondary | ICD-10-CM | POA: Insufficient documentation

## 2018-08-30 DIAGNOSIS — M6281 Muscle weakness (generalized): Secondary | ICD-10-CM

## 2018-08-31 ENCOUNTER — Encounter: Payer: Self-pay | Admitting: Family Medicine

## 2018-08-31 ENCOUNTER — Ambulatory Visit: Payer: Medicare Other | Attending: Family Medicine | Admitting: Family Medicine

## 2018-08-31 VITALS — BP 148/90 | HR 71 | Temp 98.9°F | Ht 64.75 in | Wt 160.6 lb

## 2018-08-31 DIAGNOSIS — M5410 Radiculopathy, site unspecified: Secondary | ICD-10-CM | POA: Diagnosis not present

## 2018-08-31 DIAGNOSIS — M542 Cervicalgia: Secondary | ICD-10-CM

## 2018-08-31 DIAGNOSIS — H9191 Unspecified hearing loss, right ear: Secondary | ICD-10-CM | POA: Diagnosis not present

## 2018-08-31 MED ORDER — GABAPENTIN 100 MG PO CAPS: ORAL_CAPSULE | ORAL | 3 refills | Status: DC

## 2018-08-31 MED ORDER — PREDNISONE 20 MG PO TABS: ORAL_TABLET | ORAL | 0 refills | Status: DC

## 2018-08-31 MED ORDER — TIZANIDINE HCL 4 MG PO TABS: 4.0000 mg | ORAL_TABLET | Freq: Three times a day (TID) | ORAL | 6 refills | Status: DC | PRN
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
## 2018-08-31 NOTE — Progress Notes (Signed)
Per pt pain down in his spine for about weeks now  Per pt his neck is hurting

## 2018-08-31 NOTE — Patient Instructions (Signed)
Radicular Pain Radicular pain is a type of pain that spreads from your back or neck along a spinal nerve. Spinal nerves are nerves that leave the spinal cord and go to the muscles. Radicular pain is sometimes called radiculopathy, radiculitis, or a pinched nerve. When you have this type of pain, you may also have weakness, numbness, or tingling in the area of your body that is supplied by the nerve. The pain may feel sharp and burning. Depending on which spinal nerve is affected, the pain may occur in the:  Neck area (cervical radicular pain). You may also feel pain, numbness, weakness, or tingling in the arms.  Mid-spine area (thoracic radicular pain). You would feel this pain in the back and chest. This type is rare.  Lower back area (lumbar radicular pain). You would feel this pain as low back pain. You may feel pain, numbness, weakness, or tingling in the buttocks or legs. Sciatica is a type of lumbar radicular pain that shoots down the back of the leg. Radicular pain occurs when one of the spinal nerves becomes irritated or squeezed (compressed). It is often caused by something pushing on a spinal nerve, such as one of the bones of the spine (vertebrae) or one of the round cushions between vertebrae (intervertebral disks). This can result from:  An injury.  Wear and tear or aging of a disk.  The growth of a bone spur that pushes on the nerve. Radicular pain often goes away when you follow instructions from your health care provider for relieving pain at home. Follow these instructions at home: Managing pain      If directed, put ice on the affected area: ? Put ice in a plastic bag. ? Place a towel between your skin and the bag. ? Leave the ice on for 20 minutes, 2-3 times a day.  If directed, apply heat to the affected area as often as told by your health care provider. Use the heat source that your health care provider recommends, such as a moist heat pack or a heating pad. ? Place  a towel between your skin and the heat source. ? Leave the heat on for 20-30 minutes. ? Remove the heat if your skin turns bright red. This is especially important if you are unable to feel pain, heat, or cold. You may have a greater risk of getting burned. Activity   Do not sit or rest in bed for long periods of time.  Try to stay as active as possible. Ask your health care provider what type of exercise or activity is best for you.  Avoid activities that make your pain worse, such as bending and lifting.  Do not lift anything that is heavier than 10 lb (4.5 kg), or the limit that you are told, until your health care provider says that it is safe.  Practice using proper technique when lifting items. Proper lifting technique involves bending your knees and rising up.  Do strength and range-of-motion exercises only as told by your health care provider or physical therapist. General instructions  Take over-the-counter and prescription medicines only as told by your health care provider.  Pay attention to any changes in your symptoms.  Keep all follow-up visits as told by your health care provider. This is important. ? Your health care provider may send you to a physical therapist to help with this pain. Contact a health care provider if:  Your pain and other symptoms get worse.  Your pain medicine is not   helping.  Your pain has not improved after a few weeks of home care.  You have a fever. Get help right away if:  You have severe pain, weakness, or numbness.  You have difficulty with bladder or bowel control. Summary  Radicular pain is a type of pain that spreads from your back or neck along a spinal nerve.  When you have radicular pain, you may also have weakness, numbness, or tingling in the area of your body that is supplied by the nerve.  The pain may feel sharp or burning.  Radicular pain may be treated with ice, heat, medicines, or physical therapy. This  information is not intended to replace advice given to you by your health care provider. Make sure you discuss any questions you have with your health care provider. Document Released: 04/08/2004 Document Revised: 09/13/2017 Document Reviewed: 09/13/2017 Elsevier Interactive Patient Education  2019 [employer]  

## 2018-09-01 DIAGNOSIS — M26621 Arthralgia of right temporomandibular joint: Secondary | ICD-10-CM | POA: Insufficient documentation

## 2018-09-01 DIAGNOSIS — J343 Hypertrophy of nasal turbinates: Secondary | ICD-10-CM | POA: Diagnosis not present

## 2018-09-01 DIAGNOSIS — H6121 Impacted cerumen, right ear: Secondary | ICD-10-CM | POA: Diagnosis not present

## 2018-09-01 DIAGNOSIS — H9201 Otalgia, right ear: Secondary | ICD-10-CM | POA: Diagnosis not present

## 2018-09-06 ENCOUNTER — Telehealth: Payer: Self-pay

## 2018-09-06 ENCOUNTER — Encounter: Payer: Self-pay | Admitting: Family Medicine

## 2018-09-07 ENCOUNTER — Encounter: Payer: Medicare Other | Admitting: Neurology

## 2018-09-13 ENCOUNTER — Ambulatory Visit: Payer: Medicare Other | Attending: Neurology | Admitting: Physical Therapy

## 2018-09-13 ENCOUNTER — Telehealth: Payer: Self-pay | Admitting: Physical Therapy

## 2018-09-13 DIAGNOSIS — M545 Low back pain: Secondary | ICD-10-CM | POA: Insufficient documentation

## 2018-09-13 DIAGNOSIS — G8929 Other chronic pain: Secondary | ICD-10-CM | POA: Insufficient documentation

## 2018-09-13 DIAGNOSIS — M6281 Muscle weakness (generalized): Secondary | ICD-10-CM | POA: Insufficient documentation

## 2018-09-13 DIAGNOSIS — M25571 Pain in right ankle and joints of right foot: Secondary | ICD-10-CM | POA: Insufficient documentation

## 2018-09-13 DIAGNOSIS — M542 Cervicalgia: Secondary | ICD-10-CM | POA: Insufficient documentation

## 2018-09-13 NOTE — Telephone Encounter (Signed)
Attempted to call to check on status regarding no show for 5:15 therapy appointment. Left voicemail with reminder of next appointment time.

## 2018-09-14 ENCOUNTER — Encounter: Payer: Self-pay | Admitting: Physical Therapy

## 2018-09-14 ENCOUNTER — Ambulatory Visit: Payer: Medicare Other | Admitting: Physical Therapy

## 2018-09-14 ENCOUNTER — Other Ambulatory Visit: Payer: Self-pay

## 2018-09-14 DIAGNOSIS — G8929 Other chronic pain: Secondary | ICD-10-CM | POA: Diagnosis present

## 2018-09-14 DIAGNOSIS — M6281 Muscle weakness (generalized): Secondary | ICD-10-CM

## 2018-09-14 DIAGNOSIS — M545 Low back pain: Secondary | ICD-10-CM | POA: Diagnosis present

## 2018-09-14 DIAGNOSIS — M25571 Pain in right ankle and joints of right foot: Secondary | ICD-10-CM | POA: Diagnosis present

## 2018-09-14 DIAGNOSIS — M542 Cervicalgia: Secondary | ICD-10-CM | POA: Diagnosis present
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
## 2018-09-14 NOTE — Patient Instructions (Signed)

## 2018-09-18 ENCOUNTER — Telehealth: Payer: Self-pay | Admitting: Physical Therapy

## 2018-09-18 ENCOUNTER — Ambulatory Visit: Payer: Medicare Other | Admitting: Physical Therapy

## 2018-09-18 NOTE — Telephone Encounter (Signed)
Attempted to call patient regarding no show for therapy appointment. No answer (cell), left voicemail with reminder of next appointment time as well as facility attendance policy.

## 2018-09-20 ENCOUNTER — Ambulatory Visit: Payer: Medicare Other | Admitting: Physical Therapy

## 2018-09-21 ENCOUNTER — Other Ambulatory Visit: Payer: Self-pay

## 2018-09-21 ENCOUNTER — Ambulatory Visit (INDEPENDENT_AMBULATORY_CARE_PROVIDER_SITE_OTHER): Payer: Medicare Other | Admitting: Neurology

## 2018-09-21 DIAGNOSIS — G5601 Carpal tunnel syndrome, right upper limb: Secondary | ICD-10-CM

## 2018-09-21 DIAGNOSIS — R202 Paresthesia of skin: Secondary | ICD-10-CM

## 2018-09-21 DIAGNOSIS — Z0289 Encounter for other administrative examinations: Secondary | ICD-10-CM

## 2018-09-21 DIAGNOSIS — R200 Anesthesia of skin: Secondary | ICD-10-CM

## 2018-09-24 NOTE — Progress Notes (Signed)
See procedure note.

## 2018-09-24 NOTE — Progress Notes (Signed)
Full Name: [name] Gender: Male MRN #: [id number] Date of Birth: [birth year]    Visit Date: 09/21/2018 07:44 Age: 48 Years 1 Months Old Examining Physician: Sarina Ill, MD  Referring Physician:    Herbie Saxon, [facility]    History: patient sent for evaluation of right upper extremity pain and numbness   Summary: Nerve Conduction Studies were performed on the bilateral upper extremities.  The right median/ulnar (palm) comparison nerve showed prolonged distal peak latency (Median Palm, 2.5 ms, N<2.2) and abnormal peak latency difference (Median Palm-Ulnar Palm, 0.8 ms, N<0.4) with a relative median delay.  The right Median 2nd Digit orthodromic sensory nerve showed borderline distal peak latency (3.4 ms, N<3.4) and reduced amplitude(8 uV, N>10). All remaining nerves (as indicated in the following tables) were within normal limits.  The right opponens pollicis showed polyphasic motor units with diminished motor unit recruitment. All remaining muscles (as indicated in the following tables) were within normal limits.     Conclusion:  There is electrophysiologic evidence of right Carpal Tunnel Syndrome as shown by the reduced median sensory conduction amplitude and the delayed median sensory conduction latency with abnormal peak latency difference on the median/ulnar comparison study. Chronic neurogenic changes were seen in a distal median muscle but no acute/ongoing denervation was observed.  No suggestion of polyneuropathy or radiculopathy.   Sarina Ill M.D.  Cc: Lanae Crumbly, PA-C, Dr. Gavin Pound,  Dr. Antony Blackbird  [facility] [address], [place] [address] Tel: [phone] Fax: [phone]        [facility]    Nerve / Sites Muscle Latency Ref. Amplitude Ref. Rel Amp Segments Distance Velocity Ref. Area    ms ms mV mV %  cm m/s m/s mVms  R Median - APB     Wrist APB 3.2 ?4.4 9.3 ?4.0 100 Wrist - APB 7   23.4     Upper arm  APB 7.6  8.8  95.1 Upper arm - Wrist 23 53 ?49 22.7  L Median - APB     Wrist APB 3.0 ?4.4 9.0 ?4.0 100 Wrist - APB 7   24.9     Upper arm APB 7.3  8.6  95.8 Upper arm - Wrist 23 54 ?49 23.7  R Ulnar - ADM     Wrist ADM 2.3 ?3.3 11.1 ?6.0 100 Wrist - ADM 7   28.3     B.Elbow ADM 5.9  9.9  89.2 B.Elbow - Wrist 20 56 ?49 28.0     A.Elbow ADM 7.9  9.0  91.2 A.Elbow - B.Elbow 10 51 ?49 27.2         A.Elbow - Wrist      L Ulnar - ADM     Wrist ADM 2.5 ?3.3 9.8 ?6.0 100 Wrist - ADM 7   22.8     B.Elbow ADM 6.3  8.4  86.2 B.Elbow - Wrist 21 56 ?49 21.8     A.Elbow ADM 8.2  7.9  93.8 A.Elbow - B.Elbow 10 52 ?49 21.3         A.Elbow - Wrist      R Radial - EIP     Forearm EIP 1.7 ?2.9 8.0 ?2.0 100 Forearm - EIP 4  ?49 33.1     Elbow EIP 4.7  7.4  91.9 Elbow - Forearm 17 55  33.2     Spiral Gr EIP 6.6  7.4  100 Spiral Gr - Elbow 10  55  33.6                 SNC    Nerve / Sites Rec. Site Peak Lat Ref.  Amp Ref. Segments Distance Peak Diff Ref.    ms ms V V  cm ms ms  R Radial - Anatomical snuff box (Forearm)     Forearm Wrist 2.4 ?2.9 21 ?15 Forearm - Wrist 10    R Median, Ulnar - Transcarpal comparison     Median Palm Wrist 2.5 ?2.2 31 ?35 Median Palm - Wrist 8       Ulnar Palm Wrist 1.7 ?2.2 13 ?12 Ulnar Palm - Wrist 8          Median Palm - Ulnar Palm  0.8 ?0.4  L Median, Ulnar - Transcarpal comparison     Median Palm Wrist 2.0 ?2.2 39 ?35 Median Palm - Wrist 8       Ulnar Palm Wrist 1.8 ?2.2 16 ?12 Ulnar Palm - Wrist 8          Median Palm - Ulnar Palm  0.2 ?0.4  R Median - Orthodromic (Dig II, Mid palm)     Dig II Wrist 3.4 ?3.4 8 ?10 Dig II - Wrist 13    L Median - Orthodromic (Dig II, Mid palm)     Dig II Wrist 2.9 ?3.4 17 ?10 Dig II - Wrist 13    R Ulnar - Orthodromic, (Dig V, Mid palm)     Dig V Wrist 2.5 ?3.1 7 ?5 Dig V - Wrist 11    L Ulnar - Orthodromic, (Dig V, Mid palm)     Dig V Wrist 2.4 ?3.1 7 ?5 Dig V - Wrist 18                     F  Wave    Nerve F Lat Ref.    ms ms  R Median - APB 28.5 ?31.0  R Ulnar - ADM 29.1 ?32.0  L Ulnar - ADM 29.3 ?32.0               EMG full       EMG Summary Table    Spontaneous MUAP Recruitment  Muscle IA Fib PSW Fasc Other Amp Dur. Poly Pattern  R. Cervical paraspinals (low) Normal None None None _______ Normal Normal Normal Normal  R. Deltoid Normal None None None _______ Normal Normal Normal Normal  R. Pronator teres Normal None None None _______ Normal Normal Normal Normal  R. Triceps brachii Normal None None None _______ Normal Normal Normal Normal  R. First dorsal interosseous Normal None None None _______ Normal Normal Normal Normal  R. Opponens pollicis Normal None None None _______ Increased Increased 4+ Reduced

## 2018-09-24 NOTE — Procedures (Signed)
Full Name: [name] Gender: Male MRN #: [id number] Date of Birth: 03-Jul-1970    Visit Date: 09/21/2018 07:44 Age: 48 Years 1 Months Old Examining Physician: Sarina Ill, MD  Referring Physician:    Herbie Saxon, [facility]    History: patient sent for evaluation of right upper extremity pain and numbness   Summary: Nerve Conduction Studies were performed on the bilateral upper extremities.  The right median/ulnar (palm) comparison nerve showed prolonged distal peak latency (Median Palm, 2.5 ms, N<2.2) and abnormal peak latency difference (Median Palm-Ulnar Palm, 0.8 ms, N<0.4) with a relative median delay.  The right Median 2nd Digit orthodromic sensory nerve showed borderline distal peak latency (3.4 ms, N<3.4) and reduced amplitude(8 uV, N>10). All remaining nerves (as indicated in the following tables) were within normal limits.  The right opponens pollicis showed polyphasic motor units with diminished motor unit recruitment. All remaining muscles (as indicated in the following tables) were within normal limits.     Conclusion:  There is electrophysiologic evidence of right Carpal Tunnel Syndrome as shown by the reduced median sensory conduction amplitude and the delayed median sensory conduction latency with abnormal peak latency difference on the median/ulnar comparison study. Chronic neurogenic changes were seen in a distal median muscle but no acute/ongoing denervation was observed.  No suggestion of polyneuropathy or radiculopathy.   Sarina Ill M.D.  Cc: Lanae Crumbly, PA-C, Dr. Gavin Pound, Dr. Antony Blackbird  [facility] [address], [place] [address] Tel: [phone] Fax: [phone]        [facility]    Nerve / Sites Muscle Latency Ref. Amplitude Ref. Rel Amp Segments Distance Velocity Ref. Area    ms ms mV mV %  cm m/s m/s mVms  R Median - APB     Wrist APB 3.2 ?4.4 9.3 ?4.0 100 Wrist - APB 7   23.4     Upper arm  APB 7.6  8.8  95.1 Upper arm - Wrist 23 53 ?49 22.7  L Median - APB     Wrist APB 3.0 ?4.4 9.0 ?4.0 100 Wrist - APB 7   24.9     Upper arm APB 7.3  8.6  95.8 Upper arm - Wrist 23 54 ?49 23.7  R Ulnar - ADM     Wrist ADM 2.3 ?3.3 11.1 ?6.0 100 Wrist - ADM 7   28.3     B.Elbow ADM 5.9  9.9  89.2 B.Elbow - Wrist 20 56 ?49 28.0     A.Elbow ADM 7.9  9.0  91.2 A.Elbow - B.Elbow 10 51 ?49 27.2         A.Elbow - Wrist      L Ulnar - ADM     Wrist ADM 2.5 ?3.3 9.8 ?6.0 100 Wrist - ADM 7   22.8     B.Elbow ADM 6.3  8.4  86.2 B.Elbow - Wrist 21 56 ?49 21.8     A.Elbow ADM 8.2  7.9  93.8 A.Elbow - B.Elbow 10 52 ?49 21.3         A.Elbow - Wrist      R Radial - EIP     Forearm EIP 1.7 ?2.9 8.0 ?2.0 100 Forearm - EIP 4  ?49 33.1     Elbow EIP 4.7  7.4  91.9 Elbow - Forearm 17 55  33.2     Spiral Gr EIP 6.6  7.4  100 Spiral Gr - Elbow 10 55  33.6                 [place]    Nerve / Sites Rec. Site Peak Lat Ref.  Amp Ref. Segments Distance Peak Diff Ref.    ms ms V V  cm ms ms  R Radial - Anatomical snuff box (Forearm)     Forearm Wrist 2.4 ?2.9 21 ?15 Forearm - Wrist 10    R Median, Ulnar - Transcarpal comparison     Median Palm Wrist 2.5 ?2.2 31 ?35 Median Palm - Wrist 8       Ulnar Palm Wrist 1.7 ?2.2 13 ?12 Ulnar Palm - Wrist 8          Median Palm - Ulnar Palm  0.8 ?0.4  L Median, Ulnar - Transcarpal comparison     Median Palm Wrist 2.0 ?2.2 39 ?35 Median Palm - Wrist 8       Ulnar Palm Wrist 1.8 ?2.2 16 ?12 Ulnar Palm - Wrist 8          Median Palm - Ulnar Palm  0.2 ?0.4  R Median - Orthodromic (Dig II, Mid palm)     Dig II Wrist 3.4 ?3.4 8 ?10 Dig II - Wrist 13    L Median - Orthodromic (Dig II, Mid palm)     Dig II Wrist 2.9 ?3.4 17 ?10 Dig II - Wrist 13    R Ulnar - Orthodromic, (Dig V, Mid palm)     Dig V Wrist 2.5 ?3.1 7 ?5 Dig V - Wrist 11    L Ulnar - Orthodromic, (Dig V, Mid palm)     Dig V Wrist 2.4 ?3.1 7 ?5 Dig V - Wrist 28                     F  Wave    Nerve F Lat Ref.    ms ms  R Median - APB 28.5 ?31.0  R Ulnar - ADM 29.1 ?32.0  L Ulnar - ADM 29.3 ?32.0               EMG full       EMG Summary Table    Spontaneous MUAP Recruitment  Muscle IA Fib PSW Fasc Other Amp Dur. Poly Pattern  R. Cervical paraspinals (low) Normal None None None _______ Normal Normal Normal Normal  R. Deltoid Normal None None None _______ Normal Normal Normal Normal  R. Pronator teres Normal None None None _______ Normal Normal Normal Normal  R. Triceps brachii Normal None None None _______ Normal Normal Normal Normal  R. First dorsal interosseous Normal None None None _______ Normal Normal Normal Normal  R. Opponens pollicis Normal None None None _______ Increased Increased 4+ Reduced

## 2018-09-25 ENCOUNTER — Telehealth: Payer: Self-pay | Admitting: Physical Therapy

## 2018-09-25 ENCOUNTER — Ambulatory Visit: Payer: Medicare Other | Admitting: Physical Therapy

## 2018-09-25 NOTE — Telephone Encounter (Signed)
Attempted to call patient regarding no show. Left voicemail for mobile/work number. Today is patient's 3rd no show for therapy-informed due to facility attendance policy would need new MD order to resume therapy and that future appointments currently scheduled have been cancelled.

## 2018-09-27 ENCOUNTER — Ambulatory Visit: Payer: Medicare Other | Admitting: Physical Therapy

## 2018-09-28 ENCOUNTER — Ambulatory Visit: Payer: Medicare Other | Attending: Family Medicine | Admitting: Family Medicine

## 2018-09-28 ENCOUNTER — Encounter: Payer: Self-pay | Admitting: Family Medicine

## 2018-09-28 ENCOUNTER — Other Ambulatory Visit: Payer: Self-pay

## 2018-09-28 DIAGNOSIS — M25571 Pain in right ankle and joints of right foot: Secondary | ICD-10-CM | POA: Diagnosis not present

## 2018-09-28 DIAGNOSIS — M5412 Radiculopathy, cervical region: Secondary | ICD-10-CM | POA: Diagnosis not present

## 2018-09-28 DIAGNOSIS — R42 Dizziness and giddiness: Secondary | ICD-10-CM | POA: Diagnosis not present

## 2018-09-28 DIAGNOSIS — G8929 Other chronic pain: Secondary | ICD-10-CM

## 2018-09-28 DIAGNOSIS — M5440 Lumbago with sciatica, unspecified side: Secondary | ICD-10-CM

## 2018-09-28 DIAGNOSIS — M545 Low back pain, unspecified: Secondary | ICD-10-CM

## 2018-09-28 NOTE — Progress Notes (Signed)
Patient is requesting a new Physical therapist referral.
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
## 2018-10-02 ENCOUNTER — Telehealth: Payer: Self-pay | Admitting: Neurology

## 2018-10-02 ENCOUNTER — Ambulatory Visit: Payer: Medicare Other | Admitting: Physical Therapy

## 2018-10-02 NOTE — Telephone Encounter (Signed)
I sent this to Dr. Iran Planas, that is his orthopaedist so he needs to follow up with Dr. Caralyn Guile. If he needs a new referral we can place it but I thought he was already a patient there. thanks

## 2018-10-02 NOTE — Telephone Encounter (Signed)
Pt stopped by the office- stating he would like a call to discuss the referral Dr. Jaynee Eagles had mentioned during NCV/EMG for his surgery on his arm

## 2018-10-03 NOTE — Telephone Encounter (Signed)
I called the pt and LVM asking for call back. When he calls back, please give him Dr. Cathren Laine message below and if he needs a new referral sent please let us know.

## 2018-10-04 ENCOUNTER — Ambulatory Visit: Payer: Medicare Other | Admitting: Physical Therapy

## 2018-10-11 ENCOUNTER — Encounter: Payer: Self-pay | Admitting: Physical Therapy

## 2018-10-11 ENCOUNTER — Other Ambulatory Visit: Payer: Self-pay

## 2018-10-11 ENCOUNTER — Ambulatory Visit: Payer: Medicare Other | Admitting: Physical Therapy

## 2018-10-11 DIAGNOSIS — M542 Cervicalgia: Secondary | ICD-10-CM | POA: Diagnosis not present

## 2018-10-11 DIAGNOSIS — M25571 Pain in right ankle and joints of right foot: Secondary | ICD-10-CM

## 2018-10-11 DIAGNOSIS — G8929 Other chronic pain: Secondary | ICD-10-CM

## 2018-10-11 DIAGNOSIS — M6281 Muscle weakness (generalized): Secondary | ICD-10-CM
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
## 2018-10-18 ENCOUNTER — Ambulatory Visit: Payer: Medicare Other | Admitting: Neurology

## 2018-10-18 DIAGNOSIS — G5630 Lesion of radial nerve, unspecified upper limb: Secondary | ICD-10-CM | POA: Insufficient documentation

## 2018-10-26 ENCOUNTER — Ambulatory Visit: Payer: Medicare Other | Admitting: Physical Therapy

## 2018-10-26 ENCOUNTER — Other Ambulatory Visit: Payer: Self-pay

## 2018-10-31 ENCOUNTER — Other Ambulatory Visit: Payer: Self-pay

## 2018-10-31 ENCOUNTER — Ambulatory Visit: Payer: Medicare Other | Attending: Neurology | Admitting: Physical Therapy

## 2018-10-31 DIAGNOSIS — M6281 Muscle weakness (generalized): Secondary | ICD-10-CM

## 2018-10-31 DIAGNOSIS — M25571 Pain in right ankle and joints of right foot: Secondary | ICD-10-CM | POA: Diagnosis present

## 2018-10-31 DIAGNOSIS — G8929 Other chronic pain: Secondary | ICD-10-CM | POA: Insufficient documentation

## 2018-10-31 DIAGNOSIS — M545 Low back pain: Secondary | ICD-10-CM | POA: Diagnosis present

## 2018-10-31 DIAGNOSIS — M542 Cervicalgia: Secondary | ICD-10-CM | POA: Diagnosis not present

## 2018-11-01 ENCOUNTER — Encounter: Payer: Self-pay | Admitting: Physical Therapy
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
## 2018-11-07 ENCOUNTER — Ambulatory Visit: Payer: Medicare Other | Admitting: Physical Therapy

## 2018-11-23 ENCOUNTER — Encounter: Payer: Self-pay | Admitting: Neurology

## 2018-11-23 ENCOUNTER — Ambulatory Visit: Payer: Medicare Other | Admitting: Neurology

## 2018-11-27 ENCOUNTER — Ambulatory Visit: Payer: Medicare Other | Attending: Neurology | Admitting: Occupational Therapy

## 2018-11-27 ENCOUNTER — Other Ambulatory Visit: Payer: Self-pay

## 2018-11-27 DIAGNOSIS — M6281 Muscle weakness (generalized): Secondary | ICD-10-CM | POA: Insufficient documentation

## 2018-11-27 DIAGNOSIS — R208 Other disturbances of skin sensation: Secondary | ICD-10-CM | POA: Diagnosis present
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
## 2018-12-18 ENCOUNTER — Telehealth: Payer: Self-pay

## 2018-12-18 ENCOUNTER — Ambulatory Visit: Payer: Medicare Other | Attending: Neurology | Admitting: Occupational Therapy

## 2018-12-18 DIAGNOSIS — M6281 Muscle weakness (generalized): Secondary | ICD-10-CM | POA: Insufficient documentation

## 2018-12-18 DIAGNOSIS — R209 Unspecified disturbances of skin sensation: Secondary | ICD-10-CM | POA: Insufficient documentation

## 2018-12-18 NOTE — Telephone Encounter (Signed)
Called and spoke with patient re: no show for O.T. appointment today and reminded him of next appointment. Pt confused his O.T. and P.T. appointments.

## 2018-12-25 ENCOUNTER — Ambulatory Visit: Payer: Medicare Other | Admitting: Occupational Therapy

## 2018-12-25 ENCOUNTER — Other Ambulatory Visit: Payer: Self-pay

## 2018-12-25 DIAGNOSIS — M6281 Muscle weakness (generalized): Secondary | ICD-10-CM

## 2018-12-25 DIAGNOSIS — R208 Other disturbances of skin sensation: Secondary | ICD-10-CM

## 2018-12-25 DIAGNOSIS — R209 Unspecified disturbances of skin sensation: Secondary | ICD-10-CM | POA: Diagnosis present

## 2018-12-25 NOTE — Patient Instructions (Signed)
Flexion (Resistive)    Hold a dumbbell weighing __2 lbs__  in hand and bend elbow, keeping wrist straight. Support elbow with folded towel on table, or hold close to body. Repeat _10-15___ times. Do __2__ sessions per day.   Leisurely Elbow Weight    Lie comfortably on back with a weight in one palm. Bend arm so elbow points up and weight gently hangs. Keep shoulder flat on floor. Raise hand to straighten arm. Repeat with other arm. Repeat _10-15___ times. Do _2___ sessions per day.  Pronation / Supination (Resistive)    Hold light hammer or 2 lb weight at one end and rotate palm up and down. Keep elbow flexed at side and wrist straight. Repeat __10__ times each way. Do __2__ sessions per day.  Extension (Resistive)    With wrist over edge of table, lift __2_ lbs, keeping arm on table surface. Lower slowly. Repeat _10-15___ times. Do __2__ sessions per day.   Flexion (Resistive)    With hand palm-up and holding __2__lbs, bend hand toward you at wrist. Relax slowly. Repeat _10-15___ times. Do __2__ sessions per day.   1. Grip Strengthening (Resistive Putty)   Squeeze putty using thumb and all fingers. Repeat _20___ times. Do __2__ sessions per day.   2. Roll putty into tube on table and pinch between first two fingers and thumb x 10 reps. Do 2 sessions per day
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
## 2019-01-01 ENCOUNTER — Other Ambulatory Visit: Payer: Self-pay

## 2019-01-01 ENCOUNTER — Ambulatory Visit: Payer: Medicare Other | Admitting: Occupational Therapy

## 2019-01-01 DIAGNOSIS — M6281 Muscle weakness (generalized): Secondary | ICD-10-CM | POA: Diagnosis not present

## 2019-01-01 DIAGNOSIS — R208 Other disturbances of skin sensation: Secondary | ICD-10-CM
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
## 2019-01-08 ENCOUNTER — Ambulatory Visit: Payer: Medicare Other | Admitting: Occupational Therapy

## 2019-01-10 ENCOUNTER — Ambulatory Visit: Payer: Medicare Other | Admitting: Occupational Therapy

## 2019-01-12 ENCOUNTER — Ambulatory Visit: Payer: Medicare Other | Admitting: Family Medicine

## 2019-02-15 ENCOUNTER — Encounter

## 2019-02-15 ENCOUNTER — Encounter: Payer: Self-pay | Admitting: Neurology

## 2019-02-15 ENCOUNTER — Ambulatory Visit (INDEPENDENT_AMBULATORY_CARE_PROVIDER_SITE_OTHER): Payer: Medicare Other | Admitting: Neurology

## 2019-02-15 ENCOUNTER — Other Ambulatory Visit: Payer: Self-pay

## 2019-02-15 VITALS — BP 131/77 | HR 63 | Temp 97.7°F | Ht 64.25 in | Wt 169.0 lb

## 2019-02-15 DIAGNOSIS — G44229 Chronic tension-type headache, not intractable: Secondary | ICD-10-CM | POA: Diagnosis not present

## 2019-02-15 DIAGNOSIS — M5410 Radiculopathy, site unspecified: Secondary | ICD-10-CM

## 2019-02-15 DIAGNOSIS — M79602 Pain in left arm: Secondary | ICD-10-CM

## 2019-02-15 DIAGNOSIS — R519 Headache, unspecified: Secondary | ICD-10-CM | POA: Diagnosis not present

## 2019-02-15 MED ORDER — GABAPENTIN 300 MG PO CAPS: 300.0000 mg | ORAL_CAPSULE | Freq: Three times a day (TID) | ORAL | 3 refills | Status: DC

## 2019-02-15 NOTE — Patient Instructions (Signed)
- get glasses, use reading glasses and see if the headaches improve. If not he can return to see [place]. I will increase the gabapentin.   - discuss possible referral to sleep evaluation with Dr. Chapman Fitch who can refer you.   General Headache Without Cause A headache is pain or discomfort that is felt around the head or neck area. There are many causes and types of headaches. In some cases, the cause may not be found. Follow these instructions at home: Watch your condition for any changes. Let your doctor know about them. Take these steps to help with your condition: Managing pain      Take over-the-counter and prescription medicines only as told by your doctor.  Lie down in a dark, quiet room when you have a headache.  If told, put ice on your head and neck area: ? Put ice in a plastic bag. ? Place a towel between your skin and the bag. ? Leave the ice on for 20 minutes, 2-3 times per day.  If told, put heat on the affected area. Use the heat source that your doctor recommends, such as a moist heat pack or a heating pad. ? Place a towel between your skin and the heat source. ? Leave the heat on for 20-30 minutes. ? Remove the heat if your skin turns bright red. This is very important if you are unable to feel pain, heat, or cold. You may have a greater risk of getting burned.  Keep lights dim if bright lights bother you or make your headaches worse. Eating and drinking  Eat meals on a regular schedule.  If you drink alcohol: ? Limit how much you use to:  0-1 drink a day for women.  0-2 drinks a day for men. ? Be aware of how much alcohol is in your drink. In the [place] one drink equals one 12 oz bottle of beer (355 mL), one 5 oz glass of wine (148 mL), or one 1 oz glass of hard liquor (44 mL).  Stop drinking caffeine, or reduce how much caffeine you drink. General instructions   Keep a journal to find out if certain things bring on headaches. For example, write down: ? What you  eat and drink. ? How much sleep you get. ? Any change to your diet or medicines.  Get a massage or try other ways to relax.  Limit stress.  Sit up straight. Do not tighten (tense) your muscles.  Do not use any products that contain nicotine or tobacco. This includes cigarettes, e-cigarettes, and chewing tobacco. If you need help quitting, ask your doctor.  Exercise regularly as told by your doctor.  Get enough sleep. This often means 7-9 hours of sleep each night.  Keep all follow-up visits as told by your doctor. This is important. Contact a doctor if:  Your symptoms are not helped by medicine.  You have a headache that feels different than the other headaches.  You feel sick to your stomach (nauseous) or you throw up (vomit).  You have a fever. Get help right away if:  Your headache gets very bad quickly.  Your headache gets worse after a lot of physical activity.  You keep throwing up.  You have a stiff neck.  You have trouble seeing.  You have trouble speaking.  You have pain in the eye or ear.  Your muscles are weak or you lose muscle control.  You lose your balance or have trouble walking.  You feel like  you will pass out (faint) or you pass out.  You are mixed up (confused).  You have a seizure. Summary  A headache is pain or discomfort that is felt around the head or neck area.  There are many causes and types of headaches. In some cases, the cause may not be found.  Keep a journal to help find out what causes your headaches. Watch your condition for any changes. Let your doctor know about them.  Contact a doctor if you have a headache that is different from usual, or if your headache is not helped by medicine.  Get help right away if your headache gets very bad, you throw up, you have trouble seeing, you lose your balance, or you have a seizure. This information is not intended to replace advice given to you by your health care provider. Make  sure you discuss any questions you have with your health care provider. Document Released: 12/09/2007 Document Revised: 09/19/2017 Document Reviewed: 09/19/2017 Elsevier Patient Education  2020 [employer].
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
## 2019-02-17 ENCOUNTER — Encounter: Payer: Self-pay | Admitting: Neurology

## 2019-02-17 DIAGNOSIS — G44229 Chronic tension-type headache, not intractable: Secondary | ICD-10-CM | POA: Insufficient documentation

## 2019-04-05 ENCOUNTER — Encounter: Payer: Medicare Other | Admitting: Neurology

## 2019-04-11 ENCOUNTER — Ambulatory Visit: Payer: Medicare Other | Admitting: Family Medicine

## 2019-04-25 ENCOUNTER — Ambulatory Visit: Payer: Medicare Other | Admitting: Family Medicine

## 2019-05-02 ENCOUNTER — Other Ambulatory Visit: Payer: Self-pay | Admitting: Family Medicine

## 2019-05-02 DIAGNOSIS — I10 Essential (primary) hypertension: Secondary | ICD-10-CM

## 2019-05-11 ENCOUNTER — Ambulatory Visit: Payer: Medicare Other | Admitting: Family Medicine

## 2019-05-16 NOTE — Therapy (Signed)
[facility] [address] [place], [place], [address] Phone: [phone]   Fax:  [phone]  Patient Details  Name: CRISTEN MURCIA MRN: [id number] Date of Birth: 20-Nov-1970 Referring Provider:  Dr. Caralyn Guile Encounter Date: 05/16/2019  OCCUPATIONAL THERAPY DISCHARGE SUMMARY  Visits from Start of Care: 3  Current functional level related to goals / functional outcomes: Pt met LTG #1 only   Remaining deficits: Unknown secondary to not returning after 3rd visit on 01/01/19   Education / Equipment: HEP  Plan: Patient agrees to discharge.  Patient goals were not met. Patient is being discharged due to not returning since the last visit.  ?????       Carey Bullocks, OTR/L 05/16/2019, 12:46 PM  [facility] [address] [place], [place], [address] Phone: [phone]   Fax:  [phone]

## 2019-05-17 ENCOUNTER — Encounter: Payer: Medicare Other | Admitting: Neurology

## 2019-05-17 ENCOUNTER — Encounter: Payer: Self-pay | Admitting: Neurology

## 2019-05-21 ENCOUNTER — Ambulatory Visit: Payer: Medicare Other | Attending: Family Medicine | Admitting: Family Medicine

## 2019-05-21 ENCOUNTER — Other Ambulatory Visit: Payer: Self-pay

## 2019-05-21 ENCOUNTER — Encounter: Payer: Self-pay | Admitting: Family Medicine

## 2019-05-21 DIAGNOSIS — R350 Frequency of micturition: Secondary | ICD-10-CM

## 2019-05-21 DIAGNOSIS — N5089 Other specified disorders of the male genital organs: Secondary | ICD-10-CM

## 2019-05-21 DIAGNOSIS — R300 Dysuria: Secondary | ICD-10-CM

## 2019-05-21 MED ORDER — CIPROFLOXACIN HCL 500 MG PO TABS: 500.0000 mg | ORAL_TABLET | Freq: Two times a day (BID) | ORAL | 0 refills | Status: DC

## 2019-05-21 MED ORDER — CIPROFLOXACIN HCL 500 MG PO TABS: 500.0000 mg | ORAL_TABLET | Freq: Two times a day (BID) | ORAL | 0 refills | Status: AC

## 2019-05-21 NOTE — Progress Notes (Signed)
DOB and name verified  C/o mass on his right testicle with pain and aching sensation X 74months
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
## 2019-05-22 NOTE — Addendum Note (Signed)
Addended bySteffanie Dunn on: 05/22/2019 09:11 AM   Modules accepted: Orders

## 2019-05-23 LAB — URINALYSIS
Bilirubin, UA: NEGATIVE
Glucose, UA: NEGATIVE
Ketones, UA: NEGATIVE
Leukocytes,UA: NEGATIVE
Nitrite, UA: NEGATIVE
Protein,UA: NEGATIVE
RBC, UA: NEGATIVE
Specific Gravity, UA: 1.025 (ref 1.005–1.030)
Urobilinogen, Ur: 1 mg/dL (ref 0.2–1.0)
pH, UA: 6.5 (ref 5.0–7.5)

## 2019-05-24 LAB — URINE CULTURE

## 2019-06-06 ENCOUNTER — Ambulatory Visit: Payer: Medicare Other | Admitting: Family Medicine

## 2019-06-11 ENCOUNTER — Ambulatory Visit: Payer: Medicare Other | Admitting: Family Medicine

## 2019-06-25 ENCOUNTER — Ambulatory Visit: Payer: Medicare Other | Admitting: Family Medicine

## 2019-07-08 ENCOUNTER — Encounter (HOSPITAL_COMMUNITY): Payer: Self-pay

## 2019-07-08 ENCOUNTER — Other Ambulatory Visit: Payer: Self-pay

## 2019-07-08 ENCOUNTER — Ambulatory Visit (HOSPITAL_COMMUNITY)
Admission: EM | Admit: 2019-07-08 | Discharge: 2019-07-08 | Disposition: A | Discharge status: Home / Self Care | Payer: Medicare Other | Attending: Physician Assistant | Admitting: Physician Assistant

## 2019-07-08 DIAGNOSIS — T24209A Burn of second degree of unspecified site of unspecified lower limb, except ankle and foot, initial encounter: Secondary | ICD-10-CM

## 2019-07-08 DIAGNOSIS — X12XXXA Contact with other hot fluids, initial encounter: Secondary | ICD-10-CM | POA: Diagnosis not present

## 2019-07-08 DIAGNOSIS — T300 Burn of unspecified body region, unspecified degree: Secondary | ICD-10-CM

## 2019-07-08 DIAGNOSIS — T23229A Burn of second degree of unspecified single finger (nail) except thumb, initial encounter: Secondary | ICD-10-CM

## 2019-07-08 MED ORDER — TETANUS-DIPHTH-ACELL PERTUSSIS 5-2.5-18.5 LF-MCG/0.5 IM SUSP
INTRAMUSCULAR | Status: AC
  Filled 2019-07-08: qty 0.5

## 2019-07-08 MED ORDER — TETANUS-DIPHTH-ACELL PERTUSSIS 5-2.5-18.5 LF-MCG/0.5 IM SUSP
0.5000 mL | Freq: Once | INTRAMUSCULAR | Status: AC
  Administered 2019-07-08: 0.5 mL via INTRAMUSCULAR
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
## 2019-07-11 ENCOUNTER — Other Ambulatory Visit: Payer: Self-pay

## 2019-07-11 ENCOUNTER — Encounter: Payer: Self-pay | Admitting: Family Medicine

## 2019-07-11 ENCOUNTER — Ambulatory Visit: Payer: Medicare Other | Attending: Family Medicine | Admitting: Family Medicine

## 2019-07-11 VITALS — BP 118/77 | HR 90 | Temp 98.5°F | Ht 64.25 in | Wt 174.0 lb

## 2019-07-11 DIAGNOSIS — T24209A Burn of second degree of unspecified site of unspecified lower limb, except ankle and foot, initial encounter: Secondary | ICD-10-CM

## 2019-07-11 DIAGNOSIS — Z09 Encounter for follow-up examination after completed treatment for conditions other than malignant neoplasm: Secondary | ICD-10-CM | POA: Diagnosis not present

## 2019-07-11 DIAGNOSIS — T23232A Burn of second degree of multiple left fingers (nail), not including thumb, initial encounter: Secondary | ICD-10-CM | POA: Diagnosis not present

## 2019-07-11 DIAGNOSIS — N5089 Other specified disorders of the male genital organs: Secondary | ICD-10-CM

## 2019-07-11 MED ORDER — DOXYCYCLINE HYCLATE 100 MG PO TABS: 100.0000 mg | ORAL_TABLET | Freq: Two times a day (BID) | ORAL | 0 refills | Status: AC

## 2019-07-11 MED ORDER — SILVER SULFADIAZINE 1 % EX CREA: 1.0000 "application " | TOPICAL_CREAM | Freq: Every day | CUTANEOUS | 2 refills | Status: DC

## 2019-07-11 MED ORDER — HYDROCODONE-ACETAMINOPHEN 5-325 MG PO TABS: 1.0000 | ORAL_TABLET | Freq: Four times a day (QID) | ORAL | 0 refills | Status: DC | PRN

## 2019-07-11 NOTE — Progress Notes (Signed)
Surgery f/u

## 2019-07-11 NOTE — Patient Instructions (Signed)
Burn Care, Adult A burn is an injury to the skin or the tissues under the skin. There are three types of burns:  First degree. These burns may cause the skin to be red and a bit swollen.  Second degree. These burns are very painful and cause the skin to be very red. The skin may also leak fluid, look shiny, and start to have blisters.  Third degree. These burns cause permanent damage. They turn the skin white or black and make it look charred, dry, and leathery. Taking care of your burn properly can help to prevent pain and infection. It can also help the burn to heal more quickly. How is this treated? Right after a burn:  Rinse or soak the burn under cool water. Do this for several minutes. Do not put ice on your burn. That can cause more damage.  Lightly cover the burn with a clean (sterile) cloth (dressing). Burn care  Raise (elevate) the injured area above the level of your heart while sitting or lying down.  Follow instructions from your doctor about: ? How to clean and take care of the burn. ? When to change and remove the cloth.  Check your burn every day for signs of infection. Check for: ? More redness, swelling, or pain. ? Warmth. ? Pus or a bad smell. Medicine   Take over-the-counter and prescription medicines only as told by your doctor.  If you were prescribed antibiotic medicine, take or apply it as told by your doctor. Do not stop using the antibiotic even if your condition improves. General instructions  To prevent infection: ? Do not put butter, oil, or other home treatments on the burn. ? Do not scratch or pick at the burn. ? Do not break any blisters. ? Do not peel skin.  Do not rub your burn, even when you are cleaning it.  Protect your burn from the sun. Contact a doctor if:  Your condition does not get better.  Your condition gets worse.  You have a fever.  Your burn looks different or starts to have black or red spots on it.  Your burn  feels warm to the touch.  Your pain is not controlled with medicine. Get help right away if:  You have redness, swelling, or pain at the site of the burn.  You have fluid, blood, or pus coming from your burn.  You have red streaks near the burn.  You have very bad pain. This information is not intended to replace advice given to you by your health care provider. Make sure you discuss any questions you have with your health care provider. Document Revised: 06/21/2018 Document Reviewed: 08/19/2015 Elsevier Patient Education  2020 Else[employer] Inc[employer]  
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
## 2019-07-19 ENCOUNTER — Ambulatory Visit: Payer: Medicare Other | Admitting: Family Medicine

## 2019-08-03 ENCOUNTER — Ambulatory Visit (HOSPITAL_COMMUNITY)
Admission: EM | Admit: 2019-08-03 | Discharge: 2019-08-03 | Disposition: A | Discharge status: Home / Self Care | Payer: Medicare Other | Attending: Urgent Care | Admitting: Urgent Care

## 2019-08-03 ENCOUNTER — Other Ambulatory Visit: Payer: Self-pay

## 2019-08-03 ENCOUNTER — Encounter (HOSPITAL_COMMUNITY): Payer: Self-pay

## 2019-08-03 DIAGNOSIS — K047 Periapical abscess without sinus: Secondary | ICD-10-CM

## 2019-08-03 DIAGNOSIS — K0889 Other specified disorders of teeth and supporting structures: Secondary | ICD-10-CM

## 2019-08-03 DIAGNOSIS — R519 Headache, unspecified: Secondary | ICD-10-CM

## 2019-08-03 MED ORDER — AMOXICILLIN-POT CLAVULANATE 875-125 MG PO TABS
1.0000 | ORAL_TABLET | Freq: Two times a day (BID) | ORAL | 0 refills | Status: DC
Start: 2019-08-03 — End: 2019-09-27

## 2019-08-03 MED ORDER — NAPROXEN 500 MG PO TABS: 500.0000 mg | ORAL_TABLET | Freq: Two times a day (BID) | ORAL | 0 refills | Status: DC
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
## 2019-09-04 ENCOUNTER — Other Ambulatory Visit: Payer: Self-pay | Admitting: Family Medicine

## 2019-09-04 DIAGNOSIS — I10 Essential (primary) hypertension: Secondary | ICD-10-CM

## 2019-09-27 ENCOUNTER — Emergency Department (HOSPITAL_BASED_OUTPATIENT_CLINIC_OR_DEPARTMENT_OTHER)
Admission: EM | Admit: 2019-09-27 | Discharge: 2019-09-27 | Disposition: A | Discharge status: Home / Self Care | Payer: Worker's Compensation | Attending: Emergency Medicine | Admitting: Emergency Medicine

## 2019-09-27 ENCOUNTER — Other Ambulatory Visit: Payer: Self-pay

## 2019-09-27 ENCOUNTER — Emergency Department (HOSPITAL_BASED_OUTPATIENT_CLINIC_OR_DEPARTMENT_OTHER): Payer: Worker's Compensation

## 2019-09-27 ENCOUNTER — Encounter (HOSPITAL_BASED_OUTPATIENT_CLINIC_OR_DEPARTMENT_OTHER): Payer: Self-pay

## 2019-09-27 DIAGNOSIS — F17210 Nicotine dependence, cigarettes, uncomplicated: Secondary | ICD-10-CM | POA: Insufficient documentation

## 2019-09-27 DIAGNOSIS — Y999 Unspecified external cause status: Secondary | ICD-10-CM | POA: Insufficient documentation

## 2019-09-27 DIAGNOSIS — Y939 Activity, unspecified: Secondary | ICD-10-CM | POA: Insufficient documentation

## 2019-09-27 DIAGNOSIS — Y9289 Other specified places as the place of occurrence of the external cause: Secondary | ICD-10-CM | POA: Diagnosis not present

## 2019-09-27 DIAGNOSIS — S4991XA Unspecified injury of right shoulder and upper arm, initial encounter: Secondary | ICD-10-CM | POA: Diagnosis present

## 2019-09-27 DIAGNOSIS — W1789XA Other fall from one level to another, initial encounter: Secondary | ICD-10-CM | POA: Insufficient documentation

## 2019-09-27 DIAGNOSIS — W19XXXA Unspecified fall, initial encounter: Secondary | ICD-10-CM

## 2019-09-27 DIAGNOSIS — S46811A Strain of other muscles, fascia and tendons at shoulder and upper arm level, right arm, initial encounter: Secondary | ICD-10-CM | POA: Diagnosis not present

## 2019-09-27 MED ORDER — ACETAMINOPHEN 500 MG PO TABS
1000.0000 mg | ORAL_TABLET | Freq: Once | ORAL | Status: AC
  Administered 2019-09-27: 1000 mg via ORAL
  Filled 2019-09-27: qty 2

## 2019-09-27 MED ORDER — KETOROLAC TROMETHAMINE 60 MG/2ML IM SOLN
15.0000 mg | Freq: Once | INTRAMUSCULAR | Status: AC
  Administered 2019-09-27: 15 mg via INTRAMUSCULAR
  Filled 2019-09-27: qty 2

## 2019-09-27 MED ORDER — OXYCODONE HCL 5 MG PO TABS
5.0000 mg | ORAL_TABLET | Freq: Once | ORAL | Status: AC
  Administered 2019-09-27: 5 mg via ORAL
  Filled 2019-09-27: qty 1
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
## 2019-09-27 NOTE — ED Triage Notes (Signed)
Pt states he fell ~19 feet from a ladder yesterday-multiple pain sites, dizziness, HA and change in BM/diarrhea-NAD-steady gait

## 2019-09-27 NOTE — Discharge Instructions (Signed)
Take 4 over the counter ibuprofen tablets 3 times a day or 2 over-the-counter naproxen tablets twice a day for pain. Also take tylenol 1000mg(2 extra strength) four times a day.    

## 2019-09-27 NOTE — ED Notes (Signed)
ED Provider at bedside. 

## 2020-02-08 ENCOUNTER — Ambulatory Visit (HOSPITAL_COMMUNITY)
Admission: EM | Admit: 2020-02-08 | Discharge: 2020-02-08 | Disposition: A | Discharge status: Home / Self Care | Payer: Medicare Other | Attending: Family Medicine | Admitting: Family Medicine

## 2020-02-08 ENCOUNTER — Encounter (HOSPITAL_COMMUNITY): Payer: Self-pay

## 2020-02-08 ENCOUNTER — Other Ambulatory Visit: Payer: Self-pay

## 2020-02-08 DIAGNOSIS — K047 Periapical abscess without sinus: Secondary | ICD-10-CM | POA: Diagnosis not present

## 2020-02-08 MED ORDER — AMOXICILLIN-POT CLAVULANATE 875-125 MG PO TABS
1.0000 | ORAL_TABLET | Freq: Two times a day (BID) | ORAL | 0 refills | Status: DC
Start: 2020-02-08 — End: 2021-03-18

## 2020-02-08 MED ORDER — HYDROCODONE-ACETAMINOPHEN 7.5-325 MG PO TABS: 1.0000 | ORAL_TABLET | Freq: Four times a day (QID) | ORAL | 0 refills | Status: DC | PRN

## 2020-02-08 NOTE — ED Triage Notes (Signed)
Pt has an abscess/cyst above a missing tooth that has become painful since yesterday. Pt has no other complaints. Pt is aox4 and ambulatory.

## 2020-02-08 NOTE — Discharge Instructions (Signed)
Take antibiotic 2 times a day. Take 2 doses today Take ibuprofen as needed for moderate pain, or Tylenol If pain is severe take hydrocodone. Do not drive on hydrocodone Be sure to follow-up with dentist
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
## 2020-02-18 ENCOUNTER — Ambulatory Visit: Payer: Medicare Other | Admitting: Family Medicine

## 2020-02-19 DIAGNOSIS — S43431A Superior glenoid labrum lesion of right shoulder, initial encounter: Secondary | ICD-10-CM | POA: Diagnosis not present

## 2020-02-19 DIAGNOSIS — S46111A Strain of muscle, fascia and tendon of long head of biceps, right arm, initial encounter: Secondary | ICD-10-CM | POA: Diagnosis not present

## 2020-02-19 DIAGNOSIS — M19011 Primary osteoarthritis, right shoulder: Secondary | ICD-10-CM | POA: Diagnosis not present

## 2020-02-19 DIAGNOSIS — G8918 Other acute postprocedural pain: Secondary | ICD-10-CM | POA: Diagnosis not present

## 2020-02-19 DIAGNOSIS — M25511 Pain in right shoulder: Secondary | ICD-10-CM | POA: Diagnosis not present

## 2020-02-19 DIAGNOSIS — M7541 Impingement syndrome of right shoulder: Secondary | ICD-10-CM | POA: Diagnosis not present

## 2020-02-19 DIAGNOSIS — M19071 Primary osteoarthritis, right ankle and foot: Secondary | ICD-10-CM | POA: Diagnosis not present

## 2020-02-19 DIAGNOSIS — I890 Lymphedema, not elsewhere classified: Secondary | ICD-10-CM | POA: Diagnosis not present

## 2020-02-20 ENCOUNTER — Ambulatory Visit: Payer: Medicare Other | Admitting: Family Medicine

## 2020-02-20 ENCOUNTER — Ambulatory Visit: Payer: Medicare Other | Admitting: Physician Assistant

## 2020-02-20 DIAGNOSIS — M159 Polyosteoarthritis, unspecified: Secondary | ICD-10-CM | POA: Diagnosis not present

## 2020-02-20 DIAGNOSIS — I890 Lymphedema, not elsewhere classified: Secondary | ICD-10-CM | POA: Diagnosis not present

## 2020-02-20 DIAGNOSIS — M25511 Pain in right shoulder: Secondary | ICD-10-CM | POA: Diagnosis not present

## 2020-02-20 DIAGNOSIS — M7541 Impingement syndrome of right shoulder: Secondary | ICD-10-CM | POA: Diagnosis not present

## 2020-02-20 DIAGNOSIS — Z4789 Encounter for other orthopedic aftercare: Secondary | ICD-10-CM | POA: Diagnosis not present

## 2020-02-26 ENCOUNTER — Ambulatory Visit: Payer: Medicare Other | Attending: Internal Medicine

## 2020-02-26 DIAGNOSIS — Z23 Encounter for immunization: Secondary | ICD-10-CM

## 2020-02-26 NOTE — Progress Notes (Signed)
   Covid-19 Vaccination Clinic  Name:  [name]    MRN: [id number] DOB: 1970-11-17  02/26/2020  [name] was observed post Covid-19 immunization for 15 minutes without incident. He was provided with Vaccine Information Sheet and instruction to access the V-Safe system.   [name] was instructed to call 911 with any severe reactions post vaccine: Marland Kitchen Difficulty breathing  . Swelling of face and throat  . A fast heartbeat  . A bad rash all over body  . Dizziness and weakness   Immunizations Administered    No immunizations on file.

## 2020-02-28 ENCOUNTER — Ambulatory Visit: Payer: Medicare Other | Admitting: Physician Assistant

## 2020-03-18 ENCOUNTER — Ambulatory Visit: Payer: Medicare Other | Admitting: Physician Assistant

## 2020-04-10 ENCOUNTER — Ambulatory Visit: Payer: Medicare Other | Admitting: Physician Assistant

## 2020-05-01 ENCOUNTER — Ambulatory Visit: Payer: Medicare Other | Admitting: Physician Assistant

## 2020-05-22 ENCOUNTER — Ambulatory Visit: Payer: Medicare Other | Admitting: Physician Assistant

## 2020-06-25 ENCOUNTER — Ambulatory Visit: Payer: Medicare Other | Admitting: Physician Assistant

## 2020-08-06 ENCOUNTER — Telehealth: Payer: Self-pay | Admitting: Physician Assistant

## 2020-08-06 ENCOUNTER — Encounter: Payer: Self-pay | Admitting: Physician Assistant

## 2020-08-06 ENCOUNTER — Other Ambulatory Visit: Payer: Self-pay

## 2020-08-06 ENCOUNTER — Ambulatory Visit: Payer: Medicare Other | Attending: Physician Assistant | Admitting: Physician Assistant

## 2020-08-06 VITALS — BP 131/83 | HR 72 | Ht 64.25 in | Wt 163.2 lb

## 2020-08-06 DIAGNOSIS — N5089 Other specified disorders of the male genital organs: Secondary | ICD-10-CM | POA: Diagnosis not present

## 2020-08-06 DIAGNOSIS — M5410 Radiculopathy, site unspecified: Secondary | ICD-10-CM

## 2020-08-06 DIAGNOSIS — I10 Essential (primary) hypertension: Secondary | ICD-10-CM | POA: Diagnosis not present

## 2020-08-06 DIAGNOSIS — Z131 Encounter for screening for diabetes mellitus: Secondary | ICD-10-CM

## 2020-08-06 MED ORDER — HYDROCHLOROTHIAZIDE 25 MG PO TABS: ORAL_TABLET | ORAL | 5 refills | Status: DC

## 2020-08-06 MED ORDER — GABAPENTIN 300 MG PO CAPS: 300.0000 mg | ORAL_CAPSULE | Freq: Three times a day (TID) | ORAL | 3 refills | Status: DC

## 2020-08-06 NOTE — Progress Notes (Signed)
Patient ID: [name], male   DOB: 03-28-1970, 50 y.o.   MRN: [id number]   [name], is a 50 y.o. male  MVH:[id number]  [id number]  DOB - Aug 11, 1970  Subjective:  Chief Complaint and HPI: [name] is a 50 y.o. male here today for lump on his testicle that he sometimes feels is larger than it was a year ago when he saw the urologist.  No pain.  He would like to see the same urologist if possible.  BP has been fine.  No HA/dizziness/CP.   No other issues or concerns.   ROS:   Constitutional:  No f/c, No night sweats, No unexplained weight loss. EENT:  No vision changes, No blurry vision, No hearing changes. No mouth, throat, or ear problems.  Respiratory: No cough, No SOB Cardiac: No CP, no palpitations GI:  No abd pain, No N/V/D. GU: No Urinary s/sx Musculoskeletal: No joint pain Neuro: No headache, no dizziness, no motor weakness.  Skin: No rash Endocrine:  No polydipsia. No polyuria.  Psych: Denies SI/HI  No problems updated.  ALLERGIES: Allergies  Allergen Reactions  . Tramadol Hives    PAST MEDICAL HISTORY: Past Medical History:  Diagnosis Date  . CTS (carpal tunnel syndrome)   . Headache   . Hypokalemia     MEDICATIONS AT HOME: Prior to Admission medications   Medication Sig Start Date End Date Taking? Authorizing Provider  ACETAMINOPHEN PO Take by mouth as needed.   Yes [provider]  amoxicillin-clavulanate (AUGMENTIN) 875-125 MG tablet Take 1 tablet by mouth every 12 (twelve) hours. 02/08/20  Yes Raylene Everts, MD  clotrimazole-betamethasone (LOTRISONE) cream clotrimazole-betamethasone 1 %-0.05 % topical cream   Yes [provider]  HYDROcodone-acetaminophen (NORCO) 7.5-325 MG tablet Take 1 tablet by mouth every 6 (six) hours as needed for moderate pain. 02/08/20  Yes Raylene Everts, MD  ibuprofen (ADVIL,MOTRIN) 800 MG tablet Take 1 tablet (800 mg total) by mouth 3 (three) times daily. Take after eating  03/16/18  Yes Fulp, Cammie, MD  tiZANidine (ZANAFLEX) 4 MG tablet Take 1 tablet (4 mg total) by mouth 3 (three) times daily as needed for muscle spasms. 08/31/18  Yes Fulp, Cammie, MD  gabapentin (NEURONTIN) 300 MG capsule Take 1 capsule (300 mg total) by mouth 3 (three) times daily. Take 1-2 pills at bedtime for nerve pain 08/06/20   Freeman Caldron M, PA-C  hydrochlorothiazide (HYDRODIURIL) 25 MG tablet TAKE 1 TABLET(25 MG) BY MOUTH DAILY 08/06/20   Argentina Donovan, PA-C     Objective:  EXAM:   Vitals:   08/06/20 1502  BP: 131/83  Pulse: 72  SpO2: 98%  Weight: 163 lb 3.2 oz (74 kg)  Height: 5' 4.25" (1.632 m)    General appearance : A&OX3. NAD. Non-toxic-appearing HEENT: Atraumatic and Normocephalic.  PERRLA. EOM intact.  Neck: supple, no JVD. No cervical lymphadenopathy. No thyromegaly Chest/Lungs:  Breathing-non-labored, Good air entry bilaterally, breath sounds normal without rales, rhonchi, or wheezing  CVS: S1 S2 regular, no murmurs, gallops, rubs  OZ:[id number] since not acute and followed by urology Extremities: Bilateral Lower Ext shows no edema, both legs are warm to touch with = pulse throughout Neurology:  CN II-XII grossly intact, Non focal.   Psych:  TP linear. J/I WNL. Normal speech. Appropriate eye contact and affect.  Skin:  No Rash  Data Review No results found for: HGBA1C   Assessment & Plan   1. Testicular mass - Ambulatory referral to Urology  2. Essential  hypertension controlled - hydrochlorothiazide (HYDRODIURIL) 25 MG tablet; TAKE 1 TABLET(25 MG) BY MOUTH DAILY  Dispense: 30 tablet; Refill: 5 - Comprehensive metabolic panel  3. Radiculopathy, unspecified spinal region stable - gabapentin (NEURONTIN) 300 MG capsule; Take 1 capsule (300 mg total) by mouth 3 (three) times daily. Take 1-2 pills at bedtime for nerve pain  Dispense: 90 capsule; Refill: 3  4. Screening for diabetes mellitus I have had a lengthy discussion and provided education about  insulin resistance and the intake of too much sugar/refined carbohydrates.  I have advised the patient to work at a goal of eliminating sugary drinks, candy, desserts, sweets, refined sugars, processed foods, and white carbohydrates.  The patient expresses understanding.  - Hemoglobin A1c     Patient have been counseled extensively about nutrition and exercise  Return in about 6 months (around 02/06/2021) for assign PCP(previously Fulp)-htn.  The patient was given clear instructions to go to ER or return to medical center if symptoms don't improve, worsen or new problems develop. The patient verbalized understanding. The patient was told to call to get lab results if they haven't heard anything in the next week.     Freeman Caldron, PA-C [facility] and [place], [place]   08/06/2020, 3:21 PM

## 2020-08-06 NOTE — Telephone Encounter (Signed)
Pharmacy called to get clarification on patient's medication, gabapentin (NEURONTIN) 300 MG capsule.  Please call to discuss at [phone]

## 2020-08-06 NOTE — Telephone Encounter (Signed)
Pharmacy was called and given correct direction for medication.

## 2020-08-07 LAB — COMPREHENSIVE METABOLIC PANEL
ALT: 24 IU/L (ref 0–44)
AST: 21 IU/L (ref 0–40)
Albumin/Globulin Ratio: 1.8 (ref 1.2–2.2)
Albumin: 4.6 g/dL (ref 3.8–4.9)
Alkaline Phosphatase: 70 IU/L (ref 44–121)
BUN/Creatinine Ratio: 15 (ref 9–20)
BUN: 14 mg/dL (ref 6–24)
Bilirubin Total: 0.6 mg/dL (ref 0.0–1.2)
CO2: 23 mmol/L (ref 20–29)
Calcium: 9.3 mg/dL (ref 8.7–10.2)
Chloride: 101 mmol/L (ref 96–106)
Creatinine, Ser: 0.96 mg/dL (ref 0.76–1.27)
Globulin, Total: 2.5 g/dL (ref 1.5–4.5)
Glucose: 93 mg/dL (ref 65–99)
Potassium: 3.9 mmol/L (ref 3.5–5.2)
Sodium: 137 mmol/L (ref 134–144)
Total Protein: 7.1 g/dL (ref 6.0–8.5)
eGFR: 96 mL/min/{1.73_m2} (ref >59)

## 2020-08-07 LAB — HEMOGLOBIN A1C
Est. average glucose Bld gHb Est-mCnc: 126 mg/dL
Hgb A1c MFr Bld: 6 % — ABNORMAL HIGH (ref 4.8–5.6)

## 2020-08-19 DIAGNOSIS — Z9889 Other specified postprocedural states: Secondary | ICD-10-CM | POA: Insufficient documentation

## 2020-10-04 ENCOUNTER — Other Ambulatory Visit: Payer: Self-pay

## 2020-10-04 ENCOUNTER — Ambulatory Visit (HOSPITAL_COMMUNITY)
Admission: EM | Admit: 2020-10-04 | Discharge: 2020-10-04 | Disposition: A | Discharge status: Home / Self Care | Payer: Medicare Other | Attending: Urgent Care | Admitting: Urgent Care

## 2020-10-04 ENCOUNTER — Encounter (HOSPITAL_COMMUNITY): Payer: Self-pay

## 2020-10-04 DIAGNOSIS — B349 Viral infection, unspecified: Secondary | ICD-10-CM | POA: Diagnosis present

## 2020-10-04 DIAGNOSIS — R059 Cough, unspecified: Secondary | ICD-10-CM

## 2020-10-04 DIAGNOSIS — J3489 Other specified disorders of nose and nasal sinuses: Secondary | ICD-10-CM | POA: Diagnosis present

## 2020-10-04 DIAGNOSIS — Z20822 Contact with and (suspected) exposure to covid-19: Secondary | ICD-10-CM

## 2020-10-04 MED ORDER — FLUTICASONE PROPIONATE 50 MCG/ACT NA SUSP
2.0000 | Freq: Every day | NASAL | 12 refills | Status: DC
Start: 2020-10-04 — End: 2021-09-24

## 2020-10-04 MED ORDER — CETIRIZINE HCL 10 MG PO TABS
10.0000 mg | ORAL_TABLET | Freq: Every day | ORAL | 0 refills | Status: DC
Start: 2020-10-04 — End: 2022-06-02

## 2020-10-04 MED ORDER — PROMETHAZINE-DM 6.25-15 MG/5ML PO SYRP
5.0000 mL | ORAL_SOLUTION | Freq: Every evening | ORAL | 0 refills | Status: DC | PRN
Start: 2020-10-04 — End: 2021-03-18

## 2020-10-04 MED ORDER — BENZONATATE 100 MG PO CAPS: 100.0000 mg | ORAL_CAPSULE | Freq: Three times a day (TID) | ORAL | 0 refills | Status: DC | PRN

## 2020-10-04 MED ORDER — ALBUTEROL SULFATE HFA 108 (90 BASE) MCG/ACT IN AERS: 1.0000 | INHALATION_SPRAY | Freq: Four times a day (QID) | RESPIRATORY_TRACT | 0 refills | Status: DC | PRN

## 2020-10-04 MED ORDER — PSEUDOEPHEDRINE HCL 60 MG PO TABS
60.0000 mg | ORAL_TABLET | Freq: Three times a day (TID) | ORAL | 0 refills | Status: DC | PRN
Start: 2020-10-04 — End: 2021-03-18
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
## 2020-10-04 NOTE — ED Triage Notes (Signed)
Pt present possible covid exposure from a family member. Symptom started this am. Pt state that the family found out Thursday that she was positive for covid.

## 2020-10-04 NOTE — Discharge Instructions (Addendum)

## 2020-10-05 LAB — SARS CORONAVIRUS 2 (TAT 6-24 HRS): SARS Coronavirus 2: POSITIVE — AB

## 2020-12-23 ENCOUNTER — Ambulatory Visit: Payer: Medicare Other | Admitting: Family Medicine

## 2021-02-09 ENCOUNTER — Ambulatory Visit: Payer: Medicare Other | Admitting: Family Medicine

## 2021-03-18 ENCOUNTER — Encounter: Payer: Self-pay | Admitting: Family Medicine

## 2021-03-18 ENCOUNTER — Ambulatory Visit: Payer: Medicare Other | Attending: Family Medicine | Admitting: Family Medicine

## 2021-03-18 ENCOUNTER — Other Ambulatory Visit: Payer: Self-pay

## 2021-03-18 VITALS — BP 132/82 | HR 96 | Resp 16 | Ht 64.0 in | Wt 171.8 lb

## 2021-03-18 DIAGNOSIS — I10 Essential (primary) hypertension: Secondary | ICD-10-CM

## 2021-03-18 DIAGNOSIS — N5089 Other specified disorders of the male genital organs: Secondary | ICD-10-CM | POA: Diagnosis not present

## 2021-03-18 DIAGNOSIS — Z1211 Encounter for screening for malignant neoplasm of colon: Secondary | ICD-10-CM

## 2021-03-18 DIAGNOSIS — M7712 Lateral epicondylitis, left elbow: Secondary | ICD-10-CM

## 2021-03-18 DIAGNOSIS — R7303 Prediabetes: Secondary | ICD-10-CM

## 2021-03-18 DIAGNOSIS — Z1159 Encounter for screening for other viral diseases: Secondary | ICD-10-CM

## 2021-03-18 DIAGNOSIS — F17210 Nicotine dependence, cigarettes, uncomplicated: Secondary | ICD-10-CM | POA: Diagnosis not present

## 2021-03-18 DIAGNOSIS — M1811 Unilateral primary osteoarthritis of first carpometacarpal joint, right hand: Secondary | ICD-10-CM

## 2021-03-18 LAB — POCT GLYCOSYLATED HEMOGLOBIN (HGB A1C): HbA1c, POC (controlled diabetic range): 6.3 % (ref 0.0–7.0)

## 2021-03-18 MED ORDER — HYDROCHLOROTHIAZIDE 25 MG PO TABS: ORAL_TABLET | ORAL | 5 refills | Status: DC

## 2021-03-18 MED ORDER — METFORMIN HCL 500 MG PO TABS: 500.0000 mg | ORAL_TABLET | Freq: Every day | ORAL | 6 refills | Status: DC

## 2021-03-18 MED ORDER — DICLOFENAC SODIUM 1 % EX GEL: 2.0000 g | Freq: Four times a day (QID) | CUTANEOUS | 1 refills | Status: DC

## 2021-03-18 NOTE — Patient Instructions (Signed)
Prediabetes Eating Plan °Prediabetes is a condition that causes blood sugar (glucose) levels to be higher than normal. This increases the risk for developing type 2 diabetes (type 2 diabetes mellitus). Working with a health care provider or nutrition specialist (dietitian) to make diet and lifestyle changes can help prevent the onset of diabetes. These changes may help you: °Control your blood glucose levels. °Improve your cholesterol levels. °Manage your blood pressure. °What are tips for following this plan? °Reading food labels °Read food labels to check the amount of fat, salt (sodium), and sugar in prepackaged foods. Avoid foods that have: °Saturated fats. °Trans fats. °Added sugars. °Avoid foods that have more than 300 milligrams (mg) of sodium per serving. Limit your sodium intake to less than 2,300 mg each day. °Shopping °Avoid buying pre-made and processed foods. °Avoid buying drinks with added sugar. °Cooking °Cook with olive oil. Do not use butter, lard, or ghee. °Bake, broil, grill, steam, or boil foods. Avoid frying. °Meal planning ° °Work with your dietitian to create an eating plan that is right for you. This may include tracking how many calories you take in each day. Use a food diary, notebook, or mobile application to track what you eat at each meal. °Consider following a Mediterranean diet. This includes: °Eating several servings of fresh fruits and vegetables each day. °Eating fish at least twice a week. °Eating one serving each day of whole grains, beans, nuts, and seeds. °Using olive oil instead of other fats. °Limiting alcohol. °Limiting red meat. °Using nonfat or low-fat dairy products. °Consider following a plant-based diet. This includes dietary choices that focus on eating mostly vegetables and fruit, grains, beans, nuts, and seeds. °If you have high blood pressure, you may need to limit your sodium intake or follow a diet such as the DASH (Dietary Approaches to Stop Hypertension) eating  plan. The DASH diet aims to lower high blood pressure. °Lifestyle °Set weight loss goals with help from your health care team. It is recommended that most people with prediabetes lose 7% of their body weight. °Exercise for at least 30 minutes 5 or more days a week. °Attend a support group or seek support from a mental health counselor. °Take over-the-counter and prescription medicines only as told by your health care provider. °What foods are recommended? °Fruits °Berries. Bananas. Apples. Oranges. Grapes. Papaya. Mango. Pomegranate. Kiwi. Grapefruit. Cherries. °Vegetables °Lettuce. Spinach. Peas. Beets. Cauliflower. Cabbage. Broccoli. Carrots. Tomatoes. Squash. Eggplant. Herbs. Peppers. Onions. Cucumbers. Brussels sprouts. °Grains °Whole grains, such as whole-wheat or whole-grain breads, crackers, cereals, and pasta. Unsweetened oatmeal. Bulgur. Barley. Quinoa. Brown rice. Corn or whole-wheat flour tortillas or taco shells. °Meats and other proteins °Seafood. Poultry without skin. Lean cuts of pork and beef. Tofu. Eggs. Nuts. Beans. °Dairy °Low-fat or fat-free dairy products, such as yogurt, cottage cheese, and cheese. °Beverages °Water. Tea. Coffee. Sugar-free or diet soda. Seltzer water. Low-fat or nonfat milk. Milk alternatives, such as soy or almond milk. °Fats and oils °Olive oil. Canola oil. Sunflower oil. Grapeseed oil. Avocado. Walnuts. °Sweets and desserts °Sugar-free or low-fat pudding. Sugar-free or low-fat ice cream and other frozen treats. °Seasonings and condiments °Herbs. Sodium-free spices. Mustard. Relish. Low-salt, low-sugar ketchup. Low-salt, low-sugar barbecue sauce. Low-fat or fat-free mayonnaise. °The items listed above may not be a complete list of recommended foods and beverages. Contact a dietitian for more information. °What foods are not recommended? °Fruits °Fruits canned with syrup. °Vegetables °Canned vegetables. Frozen vegetables with butter or cream sauce. °Grains °Refined white  flour and flour   products, such as bread, pasta, snack foods, and cereals. °Meats and other proteins °Fatty cuts of meat. Poultry with skin. Breaded or fried meat. Processed meats. °Dairy °Full-fat yogurt, cheese, or milk. °Beverages °Sweetened drinks, such as iced tea and soda. °Fats and oils °Butter. Lard. Ghee. °Sweets and desserts °Baked goods, such as cake, cupcakes, pastries, cookies, and cheesecake. °Seasonings and condiments °Spice mixes with added salt. Ketchup. Barbecue sauce. Mayonnaise. °The items listed above may not be a complete list of foods and beverages that are not recommended. Contact a dietitian for more information. °Where to find more information °American Diabetes Association: www.diabetes.org °Summary °You may need to make diet and lifestyle changes to help prevent the onset of diabetes. These changes can help you control blood sugar, improve cholesterol levels, and manage blood pressure. °Set weight loss goals with help from your health care team. It is recommended that most people with prediabetes lose 7% of their body weight. °Consider following a Mediterranean diet. This includes eating plenty of fresh fruits and vegetables, whole grains, beans, nuts, seeds, fish, and low-fat dairy, and using olive oil instead of other fats. °This information is not intended to replace advice given to you by your health care provider. Make sure you discuss any questions you have with your health care provider. °Document Revised: 05/31/2019 Document Reviewed: 05/31/2019 °Elsevier Patient Education © 2022 [employer] ° °
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
## 2021-03-25 ENCOUNTER — Ambulatory Visit (HOSPITAL_BASED_OUTPATIENT_CLINIC_OR_DEPARTMENT_OTHER): Admission: RE | Admit: 2021-03-25 | Payer: Commercial Managed Care - HMO | Source: Ambulatory Visit

## 2021-04-02 ENCOUNTER — Ambulatory Visit (HOSPITAL_BASED_OUTPATIENT_CLINIC_OR_DEPARTMENT_OTHER): Admission: RE | Admit: 2021-04-02 | Payer: Medicare Other | Source: Ambulatory Visit

## 2021-04-06 ENCOUNTER — Ambulatory Visit (HOSPITAL_BASED_OUTPATIENT_CLINIC_OR_DEPARTMENT_OTHER): Admission: RE | Admit: 2021-04-06 | Payer: Medicare Other | Source: Ambulatory Visit

## 2021-04-07 ENCOUNTER — Ambulatory Visit (HOSPITAL_BASED_OUTPATIENT_CLINIC_OR_DEPARTMENT_OTHER): Payer: Medicare Other

## 2021-04-29 ENCOUNTER — Encounter: Payer: Self-pay | Admitting: Family Medicine

## 2021-05-12 DIAGNOSIS — M65311 Trigger thumb, right thumb: Secondary | ICD-10-CM | POA: Insufficient documentation

## 2021-05-12 DIAGNOSIS — M65322 Trigger finger, left index finger: Secondary | ICD-10-CM | POA: Insufficient documentation

## 2021-08-18 ENCOUNTER — Ambulatory Visit: Payer: Self-pay | Admitting: *Deleted

## 2021-08-18 NOTE — Telephone Encounter (Signed)
  Chief Complaint: Hypertension Symptoms: BP 185/105, headache, nausea Frequency: This AM and yesterday prior to hand surgery Pertinent Negatives: Patient denies No missed dose of BP meds Disposition: '[]'$ ED /'[]'$ Urgent Care (no appt availability in office) / '[]'$ Appointment(In office/virtual)/ '[]'$  [facility]/ '[]'$ Home Care/ '[]'$ Refused Recommended Disposition /'[x]'$ [facility]/ '[]'$  Follow-up with PCP Additional Notes: Pt directed to [facility]. States will follow disposition.

## 2021-08-18 NOTE — Telephone Encounter (Signed)
Noted.  Agree with disposition.

## 2021-08-18 NOTE — Telephone Encounter (Signed)
Reason for Disposition . Systolic BP  >= 072 OR Diastolic >= 257  Answer Assessment - Initial Assessment Questions 1. BLOOD PRESSURE: "What is the blood pressure?" "Did you take at least two measurements 5 minutes apart?"     185/105 2. ONSET: "When did you take your blood pressure?"     This Am 3. HOW: "How did you obtain the blood pressure?" (e.g., visiting nurse, automatic home BP monitor)    Home , arm cuff 4. HISTORY: "Do you have a history of high blood pressure?"     Yes 5. MEDICATIONS: "Are you taking any medications for blood pressure?" "Have you missed any doses recently?"     Yes, no missed doses 6. OTHER SYMPTOMS: "Do you have any symptoms?" (e.g., headache, chest pain, blurred vision, difficulty breathing, weakness)     Headache, 9/10, nausea, sweating this AM  Protocols used: Blood Pressure - High-A-AH

## 2021-08-25 ENCOUNTER — Ambulatory Visit (HOSPITAL_COMMUNITY): Payer: Self-pay

## 2021-08-26 ENCOUNTER — Ambulatory Visit (HOSPITAL_COMMUNITY): Payer: Self-pay

## 2021-09-06 ENCOUNTER — Ambulatory Visit (HOSPITAL_COMMUNITY): Payer: Self-pay

## 2021-09-13 ENCOUNTER — Ambulatory Visit (HOSPITAL_COMMUNITY): Payer: Medicare Other

## 2021-09-16 ENCOUNTER — Ambulatory Visit: Payer: Medicare Other | Admitting: Family Medicine

## 2021-09-19 ENCOUNTER — Ambulatory Visit (HOSPITAL_COMMUNITY)
Admission: RE | Admit: 2021-09-19 | Discharge: 2021-09-19 | Disposition: A | Discharge status: Home / Self Care | Payer: Medicare Other | Source: Ambulatory Visit | Attending: Internal Medicine | Admitting: Internal Medicine

## 2021-09-19 ENCOUNTER — Encounter (HOSPITAL_COMMUNITY): Payer: Self-pay

## 2021-09-19 VITALS — BP 159/88 | HR 63 | Temp 97.7°F | Resp 18

## 2021-09-19 DIAGNOSIS — M542 Cervicalgia: Secondary | ICD-10-CM

## 2021-09-19 DIAGNOSIS — W57XXXA Bitten or stung by nonvenomous insect and other nonvenomous arthropods, initial encounter: Secondary | ICD-10-CM

## 2021-09-19 DIAGNOSIS — S30861A Insect bite (nonvenomous) of abdominal wall, initial encounter: Secondary | ICD-10-CM | POA: Diagnosis not present

## 2021-09-19 DIAGNOSIS — S161XXA Strain of muscle, fascia and tendon at neck level, initial encounter: Secondary | ICD-10-CM

## 2021-09-19 MED ORDER — NAPROXEN 375 MG PO TABS: 375.0000 mg | ORAL_TABLET | Freq: Two times a day (BID) | ORAL | 0 refills | Status: DC

## 2021-09-19 MED ORDER — DOXYCYCLINE HYCLATE 100 MG PO CAPS: 100.0000 mg | ORAL_CAPSULE | Freq: Two times a day (BID) | ORAL | 0 refills | Status: DC

## 2021-09-19 NOTE — ED Triage Notes (Signed)
Pt reports waking up with right side neck pain. States pain increases when turning head to the right and when trying to swallow.   Also endorses a tick bite on the right side of flank area 2 days ago.  States he removed the tick but area still itches.

## 2021-09-19 NOTE — Discharge Instructions (Addendum)
Take doxycycline twice daily for the next 10 days to treat possible infection to your tick bite.   Your neck pain is likely related to a muscle strain.  Take naproxen twice daily for the next 2 to 3 days with food to decrease the inflammation that is causing your neck pain.  After 2 to 3 days, you may take this medication as needed.  Continue to apply heat to your neck and perform gentle range of motion exercises to prevent muscle stiffness in your neck.  Do not take any other NSAID containing medications while taking naproxen such as aspirin, ibuprofen, Motrin, or goody powders.  If you develop any new or worsening symptoms or do not improve in the next 2 to 3 days, please return.  If your symptoms are severe, please go to the emergency room.  Follow-up with your primary care provider for further evaluation and management of your symptoms as well as ongoing wellness visits.  I hope you feel better!
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
## 2021-09-21 ENCOUNTER — Ambulatory Visit: Payer: Medicare Other | Admitting: Family Medicine

## 2021-09-24 ENCOUNTER — Other Ambulatory Visit: Payer: Self-pay

## 2021-09-24 ENCOUNTER — Ambulatory Visit: Payer: Medicare Other | Attending: Family Medicine | Admitting: Family Medicine

## 2021-09-24 ENCOUNTER — Encounter: Payer: Self-pay | Admitting: Family Medicine

## 2021-09-24 VITALS — BP 149/81 | HR 69 | Ht 64.0 in | Wt 180.8 lb

## 2021-09-24 DIAGNOSIS — Z87891 Personal history of nicotine dependence: Secondary | ICD-10-CM | POA: Diagnosis not present

## 2021-09-24 DIAGNOSIS — R202 Paresthesia of skin: Secondary | ICD-10-CM | POA: Diagnosis not present

## 2021-09-24 DIAGNOSIS — Z760 Encounter for issue of repeat prescription: Secondary | ICD-10-CM | POA: Insufficient documentation

## 2021-09-24 DIAGNOSIS — E1169 Type 2 diabetes mellitus with other specified complication: Secondary | ICD-10-CM | POA: Diagnosis present

## 2021-09-24 DIAGNOSIS — Z8249 Family history of ischemic heart disease and other diseases of the circulatory system: Secondary | ICD-10-CM | POA: Insufficient documentation

## 2021-09-24 DIAGNOSIS — B353 Tinea pedis: Secondary | ICD-10-CM | POA: Insufficient documentation

## 2021-09-24 DIAGNOSIS — R350 Frequency of micturition: Secondary | ICD-10-CM | POA: Insufficient documentation

## 2021-09-24 DIAGNOSIS — Z1159 Encounter for screening for other viral diseases: Secondary | ICD-10-CM

## 2021-09-24 DIAGNOSIS — Z7984 Long term (current) use of oral hypoglycemic drugs: Secondary | ICD-10-CM | POA: Insufficient documentation

## 2021-09-24 DIAGNOSIS — I10 Essential (primary) hypertension: Secondary | ICD-10-CM | POA: Insufficient documentation

## 2021-09-24 DIAGNOSIS — E119 Type 2 diabetes mellitus without complications: Secondary | ICD-10-CM | POA: Insufficient documentation

## 2021-09-24 DIAGNOSIS — M25511 Pain in right shoulder: Secondary | ICD-10-CM | POA: Diagnosis not present

## 2021-09-24 LAB — POCT GLYCOSYLATED HEMOGLOBIN (HGB A1C): HbA1c, POC (controlled diabetic range): 6.5 % (ref 0.0–7.0)

## 2021-09-24 MED ORDER — FLUTICASONE PROPIONATE 50 MCG/ACT NA SUSP: 2.0000 | Freq: Every day | NASAL | 12 refills | Status: AC

## 2021-09-24 MED ORDER — METFORMIN HCL 500 MG PO TABS: 500.0000 mg | ORAL_TABLET | Freq: Two times a day (BID) | ORAL | 1 refills | Status: DC

## 2021-09-24 MED ORDER — ATORVASTATIN CALCIUM 20 MG PO TABS: 20.0000 mg | ORAL_TABLET | Freq: Every day | ORAL | 1 refills | Status: DC

## 2021-09-24 MED ORDER — CLOTRIMAZOLE 1 % EX CREA: 1.0000 | TOPICAL_CREAM | Freq: Two times a day (BID) | CUTANEOUS | 0 refills | Status: DC

## 2021-09-24 MED ORDER — AMLODIPINE BESYLATE 5 MG PO TABS: 5.0000 mg | ORAL_TABLET | Freq: Every day | ORAL | 1 refills | Status: DC
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
## 2021-09-24 NOTE — Patient Instructions (Signed)
Please call to schedule your colonoscopy: [facility] [address] [place], [place] [address] PH# [phone] 

## 2021-09-24 NOTE — Progress Notes (Signed)
Sharps pain in right arm.

## 2021-10-20 ENCOUNTER — Ambulatory Visit: Payer: Medicare Other | Admitting: Cardiology
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
## 2021-11-13 ENCOUNTER — Ambulatory Visit: Payer: Medicare Other | Admitting: Cardiology

## 2021-12-15 ENCOUNTER — Ambulatory Visit: Payer: Medicare Other | Admitting: Cardiology

## 2021-12-29 ENCOUNTER — Ambulatory Visit: Payer: Medicare Other | Admitting: Family Medicine

## 2021-12-30 ENCOUNTER — Ambulatory Visit: Payer: Self-pay

## 2021-12-30 NOTE — Telephone Encounter (Signed)
  Chief Complaint: skin lump Symptoms: lump near R nipple, painful, red Frequency: 1 week  Pertinent Negatives: Patient denies itching Disposition: '[]'$ ED /'[]'$ Urgent Care (no appt availability in office) / '[x]'$ Appointment(In office/virtual)/ '[]'$  [facility]/ '[]'$ Home Care/ '[]'$ Refused Recommended Disposition /'[]'$ [facility]/ '[]'$  Follow-up with PCP Additional Notes: pt had OV yesterday but no one confirmed appt so he didn't know to come. Scheduled appt for 01/13/22 d/t first available. Advised to call back if gets worse or can go to UC. Pt verbalized understanding.   Reason for Disposition  [1] Small swelling or lump AND [2] unexplained AND [3] present > 1 week  Answer Assessment - Initial Assessment Questions 2. SIZE: "How large is the swelling?" (e.g., inches, cm; or compare to size of pinhead, tip of pen, eraser, coin, pea, grape, ping pong ball)      unsure 3. LOCATION: "Where is the swelling located?"     R side near nipple 4. ONSET: "When did the swelling start?"     1 week 5. COLOR: "What color is it?" "Is there more than one color?"     redness 6. PAIN: "Is there any pain?" If Yes, ask: "How bad is the pain?" (e.g., scale 1-10; or mild, moderate, severe)     - NONE (0): no pain   - MILD (1-3): doesn't interfere with normal activities    - MODERATE (4-7): interferes with normal activities or awakens from sleep    - SEVERE (8-10): excruciating pain, unable to do any normal activities     5 7. ITCH: "Does it itch?" If Yes, ask: "How bad is the itch?"      no 9 OTHER SYMPTOMS: "Do you have any other symptoms?" (e.g., fever)     Burning sensation  Protocols used: Skin Lump or Localized Swelling-A-AH

## 2021-12-31 ENCOUNTER — Ambulatory Visit (HOSPITAL_COMMUNITY): Payer: Self-pay

## 2022-01-07 ENCOUNTER — Ambulatory Visit: Payer: Medicare Other | Admitting: Internal Medicine

## 2022-01-13 ENCOUNTER — Ambulatory Visit: Payer: Medicare Other | Admitting: Physician Assistant

## 2022-01-26 DIAGNOSIS — M65331 Trigger finger, right middle finger: Secondary | ICD-10-CM | POA: Insufficient documentation

## 2022-02-11 ENCOUNTER — Encounter: Payer: Self-pay | Admitting: Internal Medicine

## 2022-02-11 ENCOUNTER — Ambulatory Visit: Payer: Medicare Other | Attending: Cardiology | Admitting: Internal Medicine

## 2022-02-11 VITALS — BP 138/86 | HR 67 | Ht 64.25 in | Wt 178.0 lb

## 2022-02-11 DIAGNOSIS — R079 Chest pain, unspecified: Secondary | ICD-10-CM | POA: Diagnosis not present

## 2022-02-11 DIAGNOSIS — Z01810 Encounter for preprocedural cardiovascular examination: Secondary | ICD-10-CM | POA: Diagnosis not present

## 2022-02-11 DIAGNOSIS — Z79899 Other long term (current) drug therapy: Secondary | ICD-10-CM

## 2022-02-11 DIAGNOSIS — R0602 Shortness of breath: Secondary | ICD-10-CM | POA: Diagnosis not present

## 2022-02-11 MED ORDER — AMLODIPINE BESYLATE 10 MG PO TABS: 10.0000 mg | ORAL_TABLET | Freq: Every day | ORAL | 3 refills | Status: DC

## 2022-02-11 MED ORDER — METOPROLOL TARTRATE 100 MG PO TABS: ORAL_TABLET | ORAL | 0 refills | Status: DC

## 2022-02-11 NOTE — Patient Instructions (Signed)
Medication Instructions:  INCREASE AMLODIPINE TO 10 MG DAILY  *If you need a refill on your cardiac medications before your next appointment, please call your pharmacy*   Lab Work: BMET, HGBA1C, NMR, APO B, LIPO A TODAY  If you have labs (blood work) drawn today and your tests are completely normal, you will receive your results only by: [place] (if you have MyChart) OR A paper copy in the mail If you have any lab test that is abnormal or we need to change your treatment, we will call you to review the results.   Testing/Procedures:   Your cardiac CT will be scheduled at one of the below locations:   [facility] [address] [place], [place] [address] (336) [place] [address] [place], [place] [address] [phone]  [facility] [place], [place] [address] [phone]  If scheduled at [facility], please arrive at the [facility] and Children's Entrance (Entrance C2) of [facility] 30 minutes prior to test start time. You can use the FREE valet parking offered at entrance C (encouraged to control the heart rate for the test)  Proceed to the [facility] Radiology Department (first floor) to check-in and test prep.  All radiology patients and guests should use entrance C2 at [facility], accessed from [facility], even though the hospital's physical address listed is [address]    If scheduled at [facility] or [facility], please arrive 15 mins early for check-in and test prep.   Please follow these instructions carefully (unless otherwise directed):  Hold all erectile dysfunction medications at least 3 days (72 hrs) prior to test. (Ie viagra, cialis, sildenafil, tadalafil, etc) We will administer nitroglycerin during this exam.   On the Night  Before the Test: Be sure to Drink plenty of water. Do not consume any caffeinated/decaffeinated beverages or chocolate 12 hours prior to your test. Do not take any antihistamines 12 hours prior to your test.  On the Day of the Test: Drink plenty of water until 1 hour prior to the test. Do not eat any food 1 hour prior to test. You may take your regular medications prior to the test.  Take metoprolol (Lopressor) two hours prior to test.      After the Test: Drink plenty of water. After receiving IV contrast, you may experience a mild flushed feeling. This is normal. On occasion, you may experience a mild rash up to 24 hours after the test. This is not dangerous. If this occurs, you can take Benadryl 25 mg and increase your fluid intake. If you experience trouble breathing, this can be serious. If it is severe call 911 IMMEDIATELY. If it is mild, please call our office. If you take any of these medications: Glipizide/Metformin, Avandament, Glucavance, please do not take 48 hours after completing test unless otherwise instructed.  We will call to schedule your test 2-4 weeks out understanding that some insurance companies will need an authorization prior to the service being performed.   For non-scheduling related questions, please contact the cardiac imaging nurse navigator should you have any questions/concerns: Marchia Bond, Cardiac Imaging Nurse Navigator Gordy Clement, Cardiac Imaging Nurse Navigator [facility] Direct Office Dial: [phone]   For scheduling needs, including cancellations and rescheduling, please call [place], [phone].    Follow-Up: At [facility]  HeartCare, you and your health needs are our priority.  As part of our continuing mission to provide you with exceptional heart care, we have created designated Provider Care Teams.  These Care Teams include your primary Cardiologist (physician) and Advanced Practice Providers (APPs -   Physician Assistants and Nurse Practitioners) who all work together to provide you with the care you need, when you need it.  We recommend signing up for the patient portal called "MyChart".  Sign up information is provided on this After Visit Summary.  MyChart is used to connect with patients for Virtual Visits (Telemedicine).  Patients are able to view lab/test results, encounter notes, upcoming appointments, etc.  Non-urgent messages can be sent to your provider as well.   To learn more about what you can do with MyChart, go to [email or web address].     Important Information About Sugar
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
## 2022-02-13 LAB — NMR, LIPOPROFILE
Cholesterol, Total: 218 mg/dL — ABNORMAL HIGH (ref 100–199)
HDL Particle Number: 26.1 umol/L — ABNORMAL LOW (ref >=30.5)
HDL-C: 39 mg/dL — ABNORMAL LOW (ref >39)
LDL Particle Number: 2352 nmol/L — ABNORMAL HIGH (ref <1000)
LDL Size: 20.5 nm — ABNORMAL LOW (ref >20.5)
LDL-C (NIH Calc): 164 mg/dL — ABNORMAL HIGH (ref 0–99)
LP-IR Score: 59 — ABNORMAL HIGH (ref <=45)
Small LDL Particle Number: 1446 nmol/L — ABNORMAL HIGH (ref <=527)
Triglycerides: 82 mg/dL (ref 0–149)

## 2022-02-13 LAB — HEMOGLOBIN A1C
Est. average glucose Bld gHb Est-mCnc: 128 mg/dL
Hgb A1c MFr Bld: 6.1 % — ABNORMAL HIGH (ref 4.8–5.6)

## 2022-02-13 LAB — BASIC METABOLIC PANEL
BUN/Creatinine Ratio: 12 (ref 9–20)
BUN: 14 mg/dL (ref 6–24)
CO2: 23 mmol/L (ref 20–29)
Calcium: 9.9 mg/dL (ref 8.7–10.2)
Chloride: 105 mmol/L (ref 96–106)
Creatinine, Ser: 1.13 mg/dL (ref 0.76–1.27)
Glucose: 100 mg/dL — ABNORMAL HIGH (ref 70–99)
Potassium: 4 mmol/L (ref 3.5–5.2)
Sodium: 142 mmol/L (ref 134–144)
eGFR: 78 mL/min/{1.73_m2} (ref >59)

## 2022-02-13 LAB — LIPOPROTEIN A (LPA): Lipoprotein (a): 120.2 nmol/L — ABNORMAL HIGH (ref <75.0)

## 2022-02-13 LAB — APOLIPOPROTEIN B: Apolipoprotein B: 124 mg/dL — ABNORMAL HIGH (ref <90)

## 2022-02-23 ENCOUNTER — Telehealth (HOSPITAL_COMMUNITY): Payer: Self-pay | Admitting: *Deleted

## 2022-02-23 ENCOUNTER — Other Ambulatory Visit (HOSPITAL_COMMUNITY): Payer: Self-pay | Admitting: *Deleted

## 2022-02-23 MED ORDER — METOPROLOL TARTRATE 100 MG PO TABS: ORAL_TABLET | ORAL | 0 refills | Status: DC

## 2022-02-23 NOTE — Telephone Encounter (Signed)
Reaching out to patient to offer assistance regarding upcoming cardiac imaging study; pt verbalizes understanding of appt date/time, parking situation and where to check in, pre-test NPO status and medications ordered, and verified current allergies; name and call back number provided for further questions should they arise  David Clement RN Navigator Cardiac [place] [facility] [phone] office [phone] cell  Patient to take 117m metoprolol tartrate two hours prior to his cardiac CT scan. He is aware to arrive at 3:30pm.

## 2022-02-24 ENCOUNTER — Ambulatory Visit (HOSPITAL_COMMUNITY)
Admission: RE | Admit: 2022-02-24 | Discharge: 2022-02-24 | Disposition: A | Discharge status: Home / Self Care | Payer: Medicare Other | Source: Ambulatory Visit | Attending: Cardiology | Admitting: Cardiology

## 2022-02-24 ENCOUNTER — Encounter (HOSPITAL_COMMUNITY): Payer: Self-pay

## 2022-02-24 DIAGNOSIS — R0602 Shortness of breath: Secondary | ICD-10-CM | POA: Diagnosis not present

## 2022-02-24 DIAGNOSIS — I2510 Atherosclerotic heart disease of native coronary artery without angina pectoris: Secondary | ICD-10-CM | POA: Insufficient documentation

## 2022-02-24 DIAGNOSIS — Z01810 Encounter for preprocedural cardiovascular examination: Secondary | ICD-10-CM | POA: Diagnosis not present

## 2022-02-24 DIAGNOSIS — R079 Chest pain, unspecified: Secondary | ICD-10-CM | POA: Diagnosis not present

## 2022-02-24 DIAGNOSIS — Z79899 Other long term (current) drug therapy: Secondary | ICD-10-CM | POA: Insufficient documentation

## 2022-02-24 MED ORDER — IOHEXOL 350 MG/ML SOLN
95.0000 mL | Freq: Once | INTRAVENOUS | Status: AC | PRN
  Administered 2022-02-24: 95 mL via INTRAVENOUS

## 2022-02-24 MED ORDER — NITROGLYCERIN 0.4 MG SL SUBL
SUBLINGUAL_TABLET | SUBLINGUAL | Status: AC
  Filled 2022-02-24: qty 2

## 2022-02-24 MED ORDER — METOPROLOL TARTRATE 5 MG/5ML IV SOLN: 10.0000 mg | INTRAVENOUS | Status: DC | PRN

## 2022-02-24 MED ORDER — NITROGLYCERIN 0.4 MG SL SUBL
0.8000 mg | SUBLINGUAL_TABLET | Freq: Once | SUBLINGUAL | Status: AC
  Administered 2022-02-24: 0.8 mg via SUBLINGUAL

## 2022-03-03 ENCOUNTER — Ambulatory Visit: Payer: Medicare Other | Attending: Physician Assistant | Admitting: Physician Assistant

## 2022-03-03 ENCOUNTER — Encounter: Payer: Self-pay | Admitting: Physician Assistant

## 2022-03-03 VITALS — BP 138/74 | HR 76 | Temp 97.9°F | Ht 64.0 in | Wt 178.0 lb

## 2022-03-03 DIAGNOSIS — N6341 Unspecified lump in right breast, subareolar: Secondary | ICD-10-CM

## 2022-03-03 DIAGNOSIS — N644 Mastodynia: Secondary | ICD-10-CM

## 2022-03-03 MED ORDER — DOXYCYCLINE HYCLATE 100 MG PO TABS: 100.0000 mg | ORAL_TABLET | Freq: Two times a day (BID) | ORAL | 0 refills | Status: DC

## 2022-03-03 NOTE — Progress Notes (Signed)
Patient ID: JB DULWORTH, male   DOB: 1970-12-23, 51 y.o.   MRN: [id number]    [name], is a 51 y.o. male  NOB:[id number]  [id number]:[id number]  DOB - [birth year]  Chief Complaint  Patient presents with   Cyst    Knots on R side of chest X1 mo       Subjective:   [name] is a 51 y.o. male here today for breast tenderness and a lump on his R breast.  No purulence or drainage.  No fever.    Seen by cardiology 02/11/2022 for CP and DOE:  ASSESSMENT AND PLAN:   1  Chest pain/DOE   CP is atypical, more concerning is exertional dyspnea.   He is moving air well     I would recomm, given lipids and hx of tobacco, a CT coronary angiogram to evaluate anatomy Labs today   2 HTN  BP is a little high   Will increae to 10 mg daily   3  LIpids   LDL in 2018 was 176  Will get lipomed, Lpa today   WIll check Hgb A1 C      4  Tob  congratulated on tobacco cession   Don't start back    No problems updated.  ALLERGIES: Allergies  Allergen Reactions   Tramadol Hives    PAST MEDICAL HISTORY: Past Medical History:  Diagnosis Date   CTS (carpal tunnel syndrome)    Family history of heart disease    Headache    Hypokalemia    Tick bite     MEDICATIONS AT HOME: Prior to Admission medications   Medication Sig Start Date End Date Taking? Authorizing Provider  ACETAMINOPHEN PO Take by mouth as needed.   Yes [provider]  albuterol (VENTOLIN HFA) 108 (90 Base) MCG/ACT inhaler Inhale 1-2 puffs into the lungs every 6 (six) hours as needed for wheezing or shortness of breath. 10/04/20  Yes Jaynee Eagles, PA-C  amLODipine (NORVASC) 10 MG tablet Take 1 tablet (10 mg total) by mouth daily. 02/11/22  Yes Jerline Pain, MD  atorvastatin (LIPITOR) 20 MG tablet Take 1 tablet (20 mg total) by mouth daily. 09/24/21  Yes Charlott Rakes, MD  cetirizine (ZYRTEC ALLERGY) 10 MG tablet Take 1 tablet (10 mg total) by mouth daily. 10/04/20  Yes Jaynee Eagles, PA-C  clotrimazole  (LOTRIMIN) 1 % cream Apply 1 Application topically 2 (two) times daily. 09/24/21  Yes Charlott Rakes, MD  clotrimazole-betamethasone (LOTRISONE) cream clotrimazole-betamethasone 1 %-0.05 % topical cream   Yes [provider]  doxycycline (VIBRA-TABS) 100 MG tablet Take 1 tablet (100 mg total) by mouth 2 (two) times daily. 03/03/22  Yes Gerilynn Mccullars M, PA-C  fluticasone (FLONASE) 50 MCG/ACT nasal spray Place 2 sprays into both nostrils daily. 09/24/21  Yes Charlott Rakes, MD  gabapentin (NEURONTIN) 300 MG capsule Take 1 capsule (300 mg total) by mouth 3 (three) times daily. Take 1-2 pills at bedtime for nerve pain 08/06/20  Yes Argentina Donovan, PA-C  HYDROcodone-acetaminophen (NORCO) 7.5-325 MG tablet Take 1 tablet by mouth every 6 (six) hours as needed for moderate pain. 02/08/20  Yes Raylene Everts, MD  metFORMIN (GLUCOPHAGE) 500 MG tablet Take 1 tablet (500 mg total) by mouth 2 (two) times daily with a meal. 09/24/21  Yes Newlin, Enobong, MD  metoprolol tartrate (LOPRESSOR) 100 MG tablet TAKE ONE TABLET (100 MG) BY MOUTH 2 HOURS PRIOR TO YOUR CARDIAC CT. 02/23/22  Yes Jerline Pain, MD  naproxen (  NAPROSYN) 375 MG tablet Take 1 tablet (375 mg total) by mouth 2 (two) times daily. 09/19/21  Yes Talbot Grumbling, FNP  omeprazole (PRILOSEC) 20 MG capsule Take 40 mg by mouth daily. 10/18/21  Yes [provider]  tiZANidine (ZANAFLEX) 4 MG tablet Take 1 tablet (4 mg total) by mouth 3 (three) times daily as needed for muscle spasms. 08/31/18  Yes Fulp, Cammie, MD    ROS: Neg HEENT Neg resp Neg cardiac Neg GI Neg GU Neg MS Neg psych Neg neuro  Objective:   Vitals:   03/03/22 0959  BP: 138/74  Pulse: 76  Temp: 97.9 F (36.6 C)  TempSrc: Oral  SpO2: 97%  Weight: 178 lb (80.7 kg)  Height: '5\' 4"'$  (1.626 m)   Exam General appearance : Awake, alert, not in any distress. Speech Clear. Not toxic looking HEENT: Atraumatic and Normocephalic Neck: Supple, no JVD. No  cervical lymphadenopathy.  Chest: Good air entry bilaterally, CTAB.  No rales/rhonchi/wheezing CVS: S1 S2 regular, no murmurs.  L breast and axilla WNL. No Lump or LN.  R subareolar lump about 2X3 cm firm, fixed but well-demarcated.  No puncta or anything that appears like an abscess but very tender.  Extremities: B/L Lower Ext shows no edema, both legs are warm to touch Neurology: Awake alert, and oriented X 3, CN II-XII intact, Non focal Skin: No Rash  Data Review Lab Results  Component Value Date   HGBA1C 6.1 (H) 02/11/2022   HGBA1C 6.5 09/24/2021   HGBA1C 6.3 03/18/2021    Assessment & Plan   1. Subareolar mass of right breast - US BREAST ASPIRATION RIGHT; Future  2. Breast tenderness in male Ultrasound and diagnostic mammo of R breast ordered - doxycycline (VIBRA-TABS) 100 MG tablet; Take 1 tablet (100 mg total) by mouth 2 (two) times daily.  Dispense: 20 tablet; Refill: 0    Return for assign PCP here if he doesn't have one; 3 months.  The patient was given clear instructions to go to ER or return to medical center if symptoms don't improve, worsen or new problems develop. The patient verbalized understanding. The patient was told to call to get lab results if they haven't heard anything in the next week.      Freeman Caldron, PA-C [facility] [place], [place]   03/03/2022, 10:16 AM

## 2022-03-17 ENCOUNTER — Telehealth: Payer: Self-pay

## 2022-03-17 DIAGNOSIS — Z79899 Other long term (current) drug therapy: Secondary | ICD-10-CM

## 2022-03-17 DIAGNOSIS — E785 Hyperlipidemia, unspecified: Secondary | ICD-10-CM

## 2022-03-17 MED ORDER — EZETIMIBE 10 MG PO TABS: 10.0000 mg | ORAL_TABLET | Freq: Every day | ORAL | 3 refills | Status: DC

## 2022-03-17 NOTE — Telephone Encounter (Signed)
My Chart message sent to the pt... need 8 weeks lab date.

## 2022-03-17 NOTE — Telephone Encounter (Signed)
-----   Message from Fay Records, MD sent at 03/16/2022  8:59 PM EST ----- These labs just came to in basket LDL  164   Lpa and Apo B are also elevated  CT scan shows no calcium but mild soft plaquing of RCA Goal would be to get LDL down   I would recomm  1.   Diet   Minimize processed foods    eliminate seed oils    Low carb  Low sugar 2  I would add lipitor 10 mig   Follow up lipomed in 8 wks with liver panel Hgb A1C is 6.1   Prediabetic   Cut out carbs.    Try time restricted eating

## 2022-03-18 NOTE — Telephone Encounter (Signed)
Pt verbalized understanding of his lab results and will have repeat labs 05/21/2022.

## 2022-03-19 ENCOUNTER — Ambulatory Visit: Payer: Medicare Other

## 2022-03-19 ENCOUNTER — Ambulatory Visit
Admission: RE | Admit: 2022-03-19 | Discharge: 2022-03-19 | Disposition: A | Discharge status: Home / Self Care | Payer: Medicare Other | Source: Ambulatory Visit | Attending: Physician Assistant | Admitting: Physician Assistant

## 2022-03-19 DIAGNOSIS — N6341 Unspecified lump in right breast, subareolar: Secondary | ICD-10-CM

## 2022-03-19 DIAGNOSIS — N644 Mastodynia: Secondary | ICD-10-CM

## 2022-03-22 ENCOUNTER — Other Ambulatory Visit: Payer: Self-pay | Admitting: Physician Assistant

## 2022-03-22 DIAGNOSIS — N62 Hypertrophy of breast: Secondary | ICD-10-CM

## 2022-04-09 ENCOUNTER — Other Ambulatory Visit: Payer: Medicare Other

## 2022-05-21 ENCOUNTER — Other Ambulatory Visit: Payer: Medicare Other

## 2022-06-02 ENCOUNTER — Encounter: Payer: Self-pay | Admitting: Critical Care Medicine

## 2022-06-02 ENCOUNTER — Ambulatory Visit: Payer: 59 | Attending: Critical Care Medicine | Admitting: Critical Care Medicine

## 2022-06-02 VITALS — BP 149/75 | HR 70 | Temp 97.9°F | Ht 64.0 in | Wt 181.0 lb

## 2022-06-02 DIAGNOSIS — M5410 Radiculopathy, site unspecified: Secondary | ICD-10-CM

## 2022-06-02 DIAGNOSIS — Z1159 Encounter for screening for other viral diseases: Secondary | ICD-10-CM

## 2022-06-02 DIAGNOSIS — E1169 Type 2 diabetes mellitus with other specified complication: Secondary | ICD-10-CM

## 2022-06-02 DIAGNOSIS — Z1211 Encounter for screening for malignant neoplasm of colon: Secondary | ICD-10-CM

## 2022-06-02 DIAGNOSIS — M542 Cervicalgia: Secondary | ICD-10-CM | POA: Diagnosis not present

## 2022-06-02 DIAGNOSIS — I10 Essential (primary) hypertension: Secondary | ICD-10-CM | POA: Insufficient documentation

## 2022-06-02 DIAGNOSIS — B351 Tinea unguium: Secondary | ICD-10-CM | POA: Diagnosis not present

## 2022-06-02 DIAGNOSIS — F17210 Nicotine dependence, cigarettes, uncomplicated: Secondary | ICD-10-CM

## 2022-06-02 MED ORDER — CLOTRIMAZOLE-BETAMETHASONE 1-0.05 % EX CREA: TOPICAL_CREAM | CUTANEOUS | 5 refills | Status: DC

## 2022-06-02 MED ORDER — TIZANIDINE HCL 4 MG PO TABS: 4.0000 mg | ORAL_TABLET | Freq: Three times a day (TID) | ORAL | 6 refills | Status: AC | PRN

## 2022-06-02 MED ORDER — METFORMIN HCL 500 MG PO TABS: 500.0000 mg | ORAL_TABLET | Freq: Two times a day (BID) | ORAL | 1 refills | Status: DC

## 2022-06-02 MED ORDER — OMEPRAZOLE 40 MG PO CPDR: 40.0000 mg | DELAYED_RELEASE_CAPSULE | Freq: Every day | ORAL | 2 refills | Status: DC

## 2022-06-02 MED ORDER — ATORVASTATIN CALCIUM 20 MG PO TABS: 20.0000 mg | ORAL_TABLET | Freq: Every day | ORAL | 1 refills | Status: DC

## 2022-06-02 MED ORDER — GABAPENTIN 300 MG PO CAPS: 300.0000 mg | ORAL_CAPSULE | Freq: Three times a day (TID) | ORAL | 3 refills | Status: DC

## 2022-06-02 MED ORDER — EZETIMIBE 10 MG PO TABS: 10.0000 mg | ORAL_TABLET | Freq: Every day | ORAL | 3 refills | Status: DC

## 2022-06-02 MED ORDER — AMLODIPINE BESYLATE 10 MG PO TABS: 10.0000 mg | ORAL_TABLET | Freq: Every day | ORAL | 3 refills | Status: DC

## 2022-06-02 MED ORDER — VALSARTAN 40 MG PO TABS: 40.0000 mg | ORAL_TABLET | Freq: Every day | ORAL | 3 refills | Status: DC

## 2022-06-02 NOTE — Assessment & Plan Note (Signed)
No longer smoking 

## 2022-06-02 NOTE — Assessment & Plan Note (Signed)
Referral for colonoscopy made.  

## 2022-06-02 NOTE — Assessment & Plan Note (Signed)
Referral to podiatry  

## 2022-06-02 NOTE — Patient Instructions (Signed)
Eye , foot referral made Colon ref made to gastroenterology Labs today urine for diabetes and hepatitis C blood work Refills on medications sent , start valsartan 40mg  daily blood pressure, stay on amlodipine daily Return Dr Joya Gaskins 1 month

## 2022-06-02 NOTE — Assessment & Plan Note (Signed)
Plan to add valsartan 40 mg daily continue amlodipine bring patient back short-term follow-up 1 month

## 2022-06-02 NOTE — Assessment & Plan Note (Addendum)
Type 2 diabetes continue the metformin he is at goal check urine for microalbumin  Referral to ophthalmology made for retinopathy screening

## 2022-06-02 NOTE — Progress Notes (Signed)
Established Patient Office Visit  Subjective   Patient ID: [name], male    DOB: Dec 09, 1970  Age: 52 y.o. MRN: [id number]  Chief Complaint  Patient presents with   Diabetes    DM f/u. Med refill. Sharp pain on L side of head X2 days. Requesting Covid test due to exposure from son who lives with pt.   No to flu vax. No to shingles.     52 y.o.M prev seen by Dr Newlin/Mcclung  06/02/22 This patient whom I have not seen before comes in today for primary care follow-up medication refill he has had issues of gynecomastia concern for breast cancer but mammogram was benign.  He has Patient also with hyperlipidemia with elevated cholesterol in the past.  A1c was at goal at the last visit.  had right shoulder pain and has received Percocet in the past he is off this medication.  Patient does have type 2 diabetes and maintains the metformin The patient has been on amlodipine 10 mg daily this was started in January.  On arrival blood pressure remains elevated 149/75  There are no complaints from the blood pressure.      Review of Systems  Constitutional:  Negative for chills, diaphoresis, fever, malaise/fatigue and weight loss.  HENT:  Negative for congestion, hearing loss, nosebleeds, sore throat and tinnitus.   Eyes:  Negative for blurred vision, photophobia and redness.  Respiratory:  Negative for cough, hemoptysis, sputum production, shortness of breath, wheezing and stridor.   Cardiovascular:  Negative for chest pain, palpitations, orthopnea, claudication, leg swelling and PND.  Gastrointestinal:  Negative for abdominal pain, blood in stool, constipation, diarrhea, heartburn, nausea and vomiting.  Genitourinary:  Negative for dysuria, flank pain, frequency, hematuria and urgency.  Musculoskeletal:  Negative for back pain, falls, joint pain, myalgias and neck pain.  Skin:  Negative for itching and rash.  Neurological:  Negative for dizziness, tingling, tremors, sensory  change, speech change, focal weakness, seizures, loss of consciousness, weakness and headaches.  Endo/Heme/Allergies:  Negative for environmental allergies and polydipsia. Does not bruise/bleed easily.  Psychiatric/Behavioral:  Negative for depression, memory loss, substance abuse and suicidal ideas. The patient is not nervous/anxious and does not have insomnia.       Objective:     BP (!) 149/75 (BP Location: Left Arm, Patient Position: Sitting, Cuff Size: Normal)   Pulse 70   Temp 97.9 F (36.6 C) (Oral)   Ht 5\' 4"  (1.626 m)   Wt 181 lb (82.1 kg)   SpO2 99%   BMI 31.07 kg/m    Physical Exam Vitals reviewed.  Constitutional:      Appearance: Normal appearance. He is well-developed. He is not diaphoretic.  HENT:     Head: Normocephalic and atraumatic.     Nose: No nasal deformity, septal deviation, mucosal edema or rhinorrhea.     Right Sinus: No maxillary sinus tenderness or frontal sinus tenderness.     Left Sinus: No maxillary sinus tenderness or frontal sinus tenderness.     Mouth/Throat:     Pharynx: No oropharyngeal exudate.     Comments: Poor dentition Eyes:     General: No scleral icterus.    Conjunctiva/sclera: Conjunctivae normal.     Pupils: Pupils are equal, round, and reactive to light.  Neck:     Thyroid: No thyromegaly.     Vascular: No carotid bruit or JVD.     Trachea: Trachea normal. No tracheal tenderness or tracheal deviation.  Cardiovascular:  Rate and Rhythm: Normal rate and regular rhythm.     Chest Wall: PMI is not displaced.     Pulses: Normal pulses. No decreased pulses.     Heart sounds: Normal heart sounds, S1 normal and S2 normal. Heart sounds not distant. No murmur heard.    No systolic murmur is present.     No diastolic murmur is present.     No friction rub. No gallop. No S3 or S4 sounds.  Pulmonary:     Effort: No tachypnea, accessory muscle usage or respiratory distress.     Breath sounds: No stridor. No decreased breath sounds,  wheezing, rhonchi or rales.     Comments: Mild gynecomastia no nodules Chest:     Chest wall: No tenderness.  Abdominal:     General: Bowel sounds are normal. There is no distension.     Palpations: Abdomen is soft. Abdomen is not rigid.     Tenderness: There is no abdominal tenderness. There is no guarding or rebound.  Musculoskeletal:        General: Normal range of motion.     Cervical back: Normal range of motion and neck supple. No edema, erythema or rigidity. No muscular tenderness. Normal range of motion.     Comments: Dry feet and severe onychomycosis  Lymphadenopathy:     Head:     Right side of head: No submental or submandibular adenopathy.     Left side of head: No submental or submandibular adenopathy.     Cervical: No cervical adenopathy.  Skin:    General: Skin is warm and dry.     Coloration: Skin is not pale.     Findings: No rash.     Nails: There is no clubbing.  Neurological:     Mental Status: He is alert and oriented to person, place, and time.     Sensory: No sensory deficit.  Psychiatric:        Speech: Speech normal.        Behavior: Behavior normal.      No results found for any visits on 06/02/22.    The ASCVD Risk score (Arnett DK, et al., 2019) failed to calculate for the following reasons:   Cannot find a previous HDL lab    Assessment & Plan:   Problem List Items Addressed This Visit       Cardiovascular and Mediastinum   Hypertension    Plan to add valsartan 40 mg daily continue amlodipine bring patient back short-term follow-up 1 month      Relevant Medications   valsartan (DIOVAN) 40 MG tablet   amLODipine (NORVASC) 10 MG tablet   atorvastatin (LIPITOR) 20 MG tablet   ezetimibe (ZETIA) 10 MG tablet     Endocrine   Type 2 diabetes mellitus with other specified complication, without long-term current use of insulin (HCC) - Primary    Type 2 diabetes continue the metformin he is at goal check urine for microalbumin  Referral  to ophthalmology made for retinopathy screening      Relevant Medications   valsartan (DIOVAN) 40 MG tablet   atorvastatin (LIPITOR) 20 MG tablet   metFORMIN (GLUCOPHAGE) 500 MG tablet   Other Relevant Orders   Microalbumin / creatinine urine ratio   Ambulatory referral to Ophthalmology   Ambulatory referral to Podiatry     Musculoskeletal and Integument   Onychomycosis    Referral to podiatry      Relevant Medications   clotrimazole-betamethasone (LOTRISONE) cream   Other  Relevant Orders   Ambulatory referral to Podiatry     Other   Colon cancer screening    Referral for colonoscopy made      Relevant Orders   Ambulatory referral to Gastroenterology   RESOLVED: Light smoker    No longer smoking      RESOLVED: Neck pain   Relevant Medications   tiZANidine (ZANAFLEX) 4 MG tablet   Other Visit Diagnoses     Radiculopathy, unspecified spinal region       Relevant Medications   gabapentin (NEURONTIN) 300 MG capsule   tiZANidine (ZANAFLEX) 4 MG tablet   Need for hepatitis C screening test       Relevant Orders   HCV Ab w Reflex to Quant PCR      35 minutes spent extra time needed to assess multiple problems and never seen the patient before had to assess everything Return in about 1 month (around 07/03/2022) for diabetes, htn.    Asencion Noble, MD

## 2022-06-03 LAB — MICROALBUMIN / CREATININE URINE RATIO
Creatinine, Urine: 214.2 mg/dL
Microalb/Creat Ratio: 2 mg/g creat (ref 0–29)
Microalbumin, Urine: 5.3 ug/mL

## 2022-06-03 LAB — HCV INTERPRETATION

## 2022-06-03 LAB — HCV AB W REFLEX TO QUANT PCR: HCV Ab: NONREACTIVE

## 2022-06-03 NOTE — Progress Notes (Signed)
Let pt know urine is normal no kidney damage no hep C

## 2022-06-04 ENCOUNTER — Telehealth: Payer: Self-pay

## 2022-06-04 NOTE — Telephone Encounter (Signed)
Pt was called and is aware of results, DOB was confirmed.  ?

## 2022-06-04 NOTE — Telephone Encounter (Signed)
-----   Message from Elsie Stain, MD sent at 06/03/2022  3:59 PM EDT ----- Let pt know urine is normal no kidney damage no hep C

## 2022-06-07 ENCOUNTER — Ambulatory Visit (INDEPENDENT_AMBULATORY_CARE_PROVIDER_SITE_OTHER): Payer: 59 | Admitting: Podiatry

## 2022-06-07 DIAGNOSIS — Z91199 Patient's noncompliance with other medical treatment and regimen due to unspecified reason: Secondary | ICD-10-CM

## 2022-06-07 NOTE — Progress Notes (Signed)
No show

## 2022-06-15 ENCOUNTER — Ambulatory Visit: Payer: 59 | Admitting: Podiatry

## 2022-06-28 ENCOUNTER — Telehealth: Payer: Self-pay | Admitting: Critical Care Medicine

## 2022-06-28 ENCOUNTER — Encounter: Payer: Self-pay | Admitting: Podiatry

## 2022-06-28 ENCOUNTER — Ambulatory Visit (INDEPENDENT_AMBULATORY_CARE_PROVIDER_SITE_OTHER): Payer: 59 | Admitting: Podiatry

## 2022-06-28 DIAGNOSIS — B353 Tinea pedis: Secondary | ICD-10-CM | POA: Diagnosis not present

## 2022-06-28 DIAGNOSIS — E1169 Type 2 diabetes mellitus with other specified complication: Secondary | ICD-10-CM | POA: Diagnosis not present

## 2022-06-28 NOTE — Telephone Encounter (Signed)
Contacted [name] to schedule their annual wellness visit. Appointment made for 07/01/22.  [name] AWV direct phone # [phone]

## 2022-06-28 NOTE — Progress Notes (Signed)
  Subjective:  Patient ID: [name], male    DOB: 08/20/1970,   MRN: [id number]  Chief Complaint  Patient presents with   Nail Problem    Pt stated that he has athletes foot     52 y.o. male presents for concern of athltetes foot that has been going on for years. PCP started him on lotrisone and has been using since Saturday and relates it has been helping.  Denies burning and tingling in their feet. Patient is diabetic and last A1c was  Lab Results  Component Value Date   HGBA1C 6.1 (H) 02/11/2022   .   PCP:  Storm Frisk, MD   . Denies any other pedal complaints. Denies n/v/f/c.   Past Medical History:  Diagnosis Date   CTS (carpal tunnel syndrome)    Family history of heart disease    Headache    Hypokalemia    Light smoker 06/18/2014   Tick bite     Objective:  Physical Exam: Vascular: DP/PT pulses 2/4 bilateral. CFT <3 seconds. Normal hair growth on digits. No edema.  Skin. No lacerations or abrasions bilateral feet. Scaling and erythema noted to planar feet bilateral and some in the interspaces Nails 1-5 bilateral normal in appearance.  Musculoskeletal: MMT 5/5 bilateral lower extremities in DF, PF, Inversion and Eversion. Deceased ROM in DF of ankle joint.  Neurological: Sensation intact to light touch.   Assessment:   1. Type 2 diabetes mellitus with other specified complication, without long-term current use of insulin   2. Pain due to onychomycosis of toenails of both feet   3. Tinea pedis of both feet      Plan:  Patient was evaluated and treated and all questions answered. -Discussed and educated patient on diabetic foot care, especially with  regards to the vascular, neurological and musculoskeletal systems.  -Stressed the importance of good glycemic control and the detriment of not  controlling glucose levels in relation to the foot. -Discussed supportive shoes at all times and checking feet regularly.  Conitnue with lotrisone.  Follow-up  in one month for recheck.     Louann Sjogren, DPM

## 2022-07-01 ENCOUNTER — Ambulatory Visit: Payer: 59 | Attending: Critical Care Medicine

## 2022-07-01 VITALS — Ht 65.0 in | Wt 181.0 lb

## 2022-07-01 DIAGNOSIS — Z0000 Encounter for general adult medical examination without abnormal findings: Secondary | ICD-10-CM | POA: Diagnosis not present

## 2022-07-01 NOTE — Patient Instructions (Signed)
[name] , Thank you for taking time to come for your Medicare Wellness Visit. I appreciate your ongoing commitment to your health goals. Please review the following plan we discussed and let me know if I can assist you in the future.   These are the goals we discussed:  Goals      Patient Stated     07/01/2022, wants to lose weight        This is a list of the screening recommended for you and due dates:  Health Maintenance  Topic Date Due   Complete foot exam   Never done   Eye exam for diabetics  Never done   Zoster (Shingles) Vaccine (1 of 2) Never done   Colon Cancer Screening  Never done   COVID-19 Vaccine (2 - Janssen risk series) 04/12/2020   Hemoglobin A1C  08/12/2022   Flu Shot  10/14/2022   Yearly kidney function blood test for diabetes  02/12/2023   Screening for Lung Cancer  02/25/2023   Yearly kidney health urinalysis for diabetes  06/02/2023   Medicare Annual Wellness Visit  07/01/2023   DTaP/Tdap/Td vaccine (3 - Td or Tdap) 07/07/2029   Hepatitis C Screening: USPSTF Recommendation to screen - Ages 82-79 yo.  Completed   HIV Screening  Completed   HPV Vaccine  Aged Out    Advanced directives: Advance directive discussed with you today.   Conditions/risks identified: none  Next appointment: Follow up in one year for your annual wellness visit   Preventive Care 40-64 Years, Male Preventive care refers to lifestyle choices and visits with your health care provider that can promote health and wellness. What does preventive care include? A yearly physical exam. This is also called an annual well check. Dental exams once or twice a year. Routine eye exams. Ask your health care provider how often you should have your eyes checked. Personal lifestyle choices, including: Daily care of your teeth and gums. Regular physical activity. Eating a healthy diet. Avoiding tobacco and drug use. Limiting alcohol use. Practicing safe sex. Taking low-dose aspirin every  day starting at age 81. What happens during an annual well check? The services and screenings done by your health care provider during your annual well check will depend on your age, overall health, lifestyle risk factors, and family history of disease. Counseling  Your health care provider may ask you questions about your: Alcohol use. Tobacco use. Drug use. Emotional well-being. Home and relationship well-being. Sexual activity. Eating habits. Work and work Astronomer. Screening  You may have the following tests or measurements: Height, weight, and BMI. Blood pressure. Lipid and cholesterol levels. These may be checked every 5 years, or more frequently if you are over 70 years old. Skin check. Lung cancer screening. You may have this screening every year starting at age 22 if you have a 30-pack-year history of smoking and currently smoke or have quit within the past 15 years. Fecal occult blood test (FOBT) of the stool. You may have this test every year starting at age 71. Flexible sigmoidoscopy or colonoscopy. You may have a sigmoidoscopy every 5 years or a colonoscopy every 10 years starting at age 49. Prostate cancer screening. Recommendations will vary depending on your family history and other risks. Hepatitis C blood test. Hepatitis B blood test. Sexually transmitted disease (STD) testing. Diabetes screening. This is done by checking your blood sugar (glucose) after you have not eaten for a while (fasting). You may have this done every 1-3 years.  Discuss your test results, treatment options, and if necessary, the need for more tests with your health care provider. Vaccines  Your health care provider may recommend certain vaccines, such as: Influenza vaccine. This is recommended every year. Tetanus, diphtheria, and acellular pertussis (Tdap, Td) vaccine. You may need a Td booster every 10 years. Zoster vaccine. You may need this after age 54. Pneumococcal 13-valent conjugate  (PCV13) vaccine. You may need this if you have certain conditions and have not been vaccinated. Pneumococcal polysaccharide (PPSV23) vaccine. You may need one or two doses if you smoke cigarettes or if you have certain conditions. Talk to your health care provider about which screenings and vaccines you need and how often you need them. This information is not intended to replace advice given to you by your health care provider. Make sure you discuss any questions you have with your health care provider. Document Released: 03/28/2015 Document Revised: 11/19/2015 Document Reviewed: 12/31/2014 Elsevier Interactive Patient Education  2017 [employer].  Fall Prevention in the Home Falls can cause injuries. They can happen to people of all ages. There are many things you can do to make your home safe and to help prevent falls. What can I do on the outside of my home? Regularly fix the edges of walkways and driveways and fix any cracks. Remove anything that might make you trip as you walk through a door, such as a raised step or threshold. Trim any bushes or trees on the path to your home. Use bright outdoor lighting. Clear any walking paths of anything that might make someone trip, such as rocks or tools. Regularly check to see if handrails are loose or broken. Make sure that both sides of any steps have handrails. Any raised decks and porches should have guardrails on the edges. Have any leaves, snow, or ice cleared regularly. Use sand or salt on walking paths during winter. Clean up any spills in your garage right away. This includes oil or grease spills. What can I do in the bathroom? Use night lights. Install grab bars by the toilet and in the tub and shower. Do not use towel bars as grab bars. Use non-skid mats or decals in the tub or shower. If you need to sit down in the shower, use a plastic, non-slip stool. Keep the floor dry. Clean up any water that spills on the floor as soon as it  happens. Remove soap buildup in the tub or shower regularly. Attach bath mats securely with double-sided non-slip rug tape. Do not have throw rugs and other things on the floor that can make you trip. What can I do in the bedroom? Use night lights. Make sure that you have a light by your bed that is easy to reach. Do not use any sheets or blankets that are too big for your bed. They should not hang down onto the floor. Have a firm chair that has side arms. You can use this for support while you get dressed. Do not have throw rugs and other things on the floor that can make you trip. What can I do in the kitchen? Clean up any spills right away. Avoid walking on wet floors. Keep items that you use a lot in easy-to-reach places. If you need to reach something above you, use a strong step stool that has a grab bar. Keep electrical cords out of the way. Do not use floor polish or wax that makes floors slippery. If you must use wax, use non-skid  floor wax. Do not have throw rugs and other things on the floor that can make you trip. What can I do with my stairs? Do not leave any items on the stairs. Make sure that there are handrails on both sides of the stairs and use them. Fix handrails that are broken or loose. Make sure that handrails are as long as the stairways. Check any carpeting to make sure that it is firmly attached to the stairs. Fix any carpet that is loose or worn. Avoid having throw rugs at the top or bottom of the stairs. If you do have throw rugs, attach them to the floor with carpet tape. Make sure that you have a light switch at the top of the stairs and the bottom of the stairs. If you do not have them, ask someone to add them for you. What else can I do to help prevent falls? Wear shoes that: Do not have high heels. Have rubber bottoms. Are comfortable and fit you well. Are closed at the toe. Do not wear sandals. If you use a stepladder: Make sure that it is fully opened.  Do not climb a closed stepladder. Make sure that both sides of the stepladder are locked into place. Ask someone to hold it for you, if possible. Clearly mark and make sure that you can see: Any grab bars or handrails. First and last steps. Where the edge of each step is. Use tools that help you move around (mobility aids) if they are needed. These include: Canes. Walkers. Scooters. Crutches. Turn on the lights when you go into a dark area. Replace any light bulbs as soon as they burn out. Set up your furniture so you have a clear path. Avoid moving your furniture around. If any of your floors are uneven, fix them. If there are any pets around you, be aware of where they are. Review your medicines with your doctor. Some medicines can make you feel dizzy. This can increase your chance of falling. Ask your doctor what other things that you can do to help prevent falls. This information is not intended to replace advice given to you by your health care provider. Make sure you discuss any questions you have with your health care provider. Document Released: 12/26/2008 Document Revised: 08/07/2015 Document Reviewed: 04/05/2014 Elsevier Interactive Patient Education  2017 [employer].
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
## 2022-07-26 NOTE — Progress Notes (Deleted)
Established Patient Office Visit  Subjective   Patient ID: [name], male    DOB: 26-Mar-1970  Age: 52 y.o. MRN: [id number]  No chief complaint on file.   52 y.o.M prev seen by Dr Newlin/Mcclung  06/02/22 This patient whom I have not seen before comes in today for primary care follow-up medication refill he has had issues of gynecomastia concern for breast cancer but mammogram was benign.  He has Patient also with hyperlipidemia with elevated cholesterol in the past.  A1c was at goal at the last visit.  had right shoulder pain and has received Percocet in the past he is off this medication.  Patient does have type 2 diabetes and maintains the metformin The patient has been on amlodipine 10 mg daily this was started in January.  On arrival blood pressure remains elevated 149/75  There are no complaints from the blood pressure.  07/27/22 Foot eye       Review of Systems  Constitutional:  Negative for chills, diaphoresis, fever, malaise/fatigue and weight loss.  HENT:  Negative for congestion, hearing loss, nosebleeds, sore throat and tinnitus.   Eyes:  Negative for blurred vision, photophobia and redness.  Respiratory:  Negative for cough, hemoptysis, sputum production, shortness of breath, wheezing and stridor.   Cardiovascular:  Negative for chest pain, palpitations, orthopnea, claudication, leg swelling and PND.  Gastrointestinal:  Negative for abdominal pain, blood in stool, constipation, diarrhea, heartburn, nausea and vomiting.  Genitourinary:  Negative for dysuria, flank pain, frequency, hematuria and urgency.  Musculoskeletal:  Negative for back pain, falls, joint pain, myalgias and neck pain.  Skin:  Negative for itching and rash.  Neurological:  Negative for dizziness, tingling, tremors, sensory change, speech change, focal weakness, seizures, loss of consciousness, weakness and headaches.  Endo/Heme/Allergies:  Negative for environmental allergies and polydipsia.  Does not bruise/bleed easily.  Psychiatric/Behavioral:  Negative for depression, memory loss, substance abuse and suicidal ideas. The patient is not nervous/anxious and does not have insomnia.       Objective:     There were no vitals taken for this visit.   Physical Exam Vitals reviewed.  Constitutional:      Appearance: Normal appearance. He is well-developed. He is not diaphoretic.  HENT:     Head: Normocephalic and atraumatic.     Nose: No nasal deformity, septal deviation, mucosal edema or rhinorrhea.     Right Sinus: No maxillary sinus tenderness or frontal sinus tenderness.     Left Sinus: No maxillary sinus tenderness or frontal sinus tenderness.     Mouth/Throat:     Pharynx: No oropharyngeal exudate.     Comments: Poor dentition Eyes:     General: No scleral icterus.    Conjunctiva/sclera: Conjunctivae normal.     Pupils: Pupils are equal, round, and reactive to light.  Neck:     Thyroid: No thyromegaly.     Vascular: No carotid bruit or JVD.     Trachea: Trachea normal. No tracheal tenderness or tracheal deviation.  Cardiovascular:     Rate and Rhythm: Normal rate and regular rhythm.     Chest Wall: PMI is not displaced.     Pulses: Normal pulses. No decreased pulses.     Heart sounds: Normal heart sounds, S1 normal and S2 normal. Heart sounds not distant. No murmur heard.    No systolic murmur is present.     No diastolic murmur is present.     No friction rub. No gallop. No S3 or  S4 sounds.  Pulmonary:     Effort: No tachypnea, accessory muscle usage or respiratory distress.     Breath sounds: No stridor. No decreased breath sounds, wheezing, rhonchi or rales.     Comments: Mild gynecomastia no nodules Chest:     Chest wall: No tenderness.  Abdominal:     General: Bowel sounds are normal. There is no distension.     Palpations: Abdomen is soft. Abdomen is not rigid.     Tenderness: There is no abdominal tenderness. There is no guarding or rebound.   Musculoskeletal:        General: Normal range of motion.     Cervical back: Normal range of motion and neck supple. No edema, erythema or rigidity. No muscular tenderness. Normal range of motion.     Comments: Dry feet and severe onychomycosis  Lymphadenopathy:     Head:     Right side of head: No submental or submandibular adenopathy.     Left side of head: No submental or submandibular adenopathy.     Cervical: No cervical adenopathy.  Skin:    General: Skin is warm and dry.     Coloration: Skin is not pale.     Findings: No rash.     Nails: There is no clubbing.  Neurological:     Mental Status: He is alert and oriented to person, place, and time.     Sensory: No sensory deficit.  Psychiatric:        Speech: Speech normal.        Behavior: Behavior normal.      No results found for any visits on 07/27/22.    The ASCVD Risk score (Arnett DK, et al., 2019) failed to calculate for the following reasons:   Cannot find a previous HDL lab    Assessment & Plan:   Problem List Items Addressed This Visit   None 35 minutes spent extra time needed to assess multiple problems and never seen the patient before had to assess everything No follow-ups on file.    Shan Levans, MD

## 2022-07-27 ENCOUNTER — Ambulatory Visit: Payer: 59 | Admitting: Critical Care Medicine

## 2022-08-02 ENCOUNTER — Ambulatory Visit: Payer: 59 | Admitting: Podiatry

## 2022-08-04 ENCOUNTER — Encounter: Payer: Self-pay | Admitting: Podiatry

## 2022-08-04 ENCOUNTER — Ambulatory Visit (INDEPENDENT_AMBULATORY_CARE_PROVIDER_SITE_OTHER): Payer: 59 | Admitting: Podiatry

## 2022-08-04 DIAGNOSIS — E1169 Type 2 diabetes mellitus with other specified complication: Secondary | ICD-10-CM

## 2022-08-04 DIAGNOSIS — B353 Tinea pedis: Secondary | ICD-10-CM

## 2022-08-04 MED ORDER — CLOTRIMAZOLE-BETAMETHASONE 1-0.05 % EX CREA: TOPICAL_CREAM | CUTANEOUS | 5 refills | Status: DC

## 2022-08-04 NOTE — Progress Notes (Signed)
  Subjective:  Patient ID: [name], male    DOB: 08/31/1970,   MRN: [id number]  Chief Complaint  Patient presents with   Tinea Pedis    RM 23 1 months follow up. Pt states much improvement. Requesting refill on ointment.    Callouses    Possible callus debridement.     52 y.o. male presents for follow-up of tinea pedis.  Diabetic foot check as well as patient has callus on inside of great toes he is concerned about. Denies burning and tingling in their feet. Patient is diabetic and last A1c was  Lab Results  Component Value Date   HGBA1C 6.1 (H) 02/11/2022   .   PCP:  Storm Frisk, MD   . Denies any other pedal complaints. Denies n/v/f/c.   Past Medical History:  Diagnosis Date   CTS (carpal tunnel syndrome)    Family history of heart disease    Headache    Hypokalemia    Light smoker 06/18/2014   Tick bite     Objective:  Physical Exam: Vascular: DP/PT pulses 2/4 bilateral. CFT <3 seconds. Normal hair growth on digits. No edema.  Skin. No lacerations or abrasions bilateral feet. Scaling and erythema noted to planar feet bilateral and some in the interspaces does appear mostly improved. Nails 1-5 bilateral normal in appearance. Hyperkeratotic lesios noted to medial feet.  Musculoskeletal: MMT 5/5 bilateral lower extremities in DF, PF, Inversion and Eversion. Deceased ROM in DF of ankle joint.  Neurological: Sensation intact to light touch. Protective sensation intact.   Assessment:   1. Tinea pedis of both feet   2. Type 2 diabetes mellitus with other specified complication, without long-term current use of insulin (HCC)       Plan:  Patient was evaluated and treated and all questions answered. -Discussed and educated patient on diabetic foot care, especially with  regards to the vascular, neurological and musculoskeletal systems.  -Stressed the importance of good glycemic control and the detriment of not  controlling glucose levels in relation to the  foot. -Discussed supportive shoes at all times and checking feet regularly.  Conitnue with lotrisone. Ref ill provided.  Follow-up in one year for DM foot check.      Louann Sjogren, DPM

## 2022-08-11 ENCOUNTER — Other Ambulatory Visit: Payer: Self-pay | Admitting: Critical Care Medicine

## 2022-08-11 DIAGNOSIS — E1169 Type 2 diabetes mellitus with other specified complication: Secondary | ICD-10-CM

## 2022-10-22 ENCOUNTER — Other Ambulatory Visit: Payer: Self-pay | Admitting: Critical Care Medicine

## 2022-10-22 DIAGNOSIS — E1169 Type 2 diabetes mellitus with other specified complication: Secondary | ICD-10-CM

## 2022-10-25 NOTE — Telephone Encounter (Signed)
Refilled 10/22/22 # 60.

## 2022-11-03 ENCOUNTER — Ambulatory Visit: Payer: 59 | Admitting: Critical Care Medicine

## 2022-11-03 NOTE — Progress Notes (Deleted)
Established Patient Office Visit  Subjective   Patient ID: [name], male    DOB: 1970-04-02  Age: 52 y.o. MRN: [id number]  No chief complaint on file.   52 y.o.M prev seen by Dr Newlin/Mcclung  06/02/22 This patient whom I have not seen before comes in today for primary care follow-up medication refill he has had issues of gynecomastia concern for breast cancer but mammogram was benign.  He has Patient also with hyperlipidemia with elevated cholesterol in the past.  A1c was at goal at the last visit.  had right shoulder pain and has received Percocet in the past he is off this medication.  Patient does have type 2 diabetes and maintains the metformin The patient has been on amlodipine 10 mg daily this was started in January.  On arrival blood pressure remains elevated 149/75  There are no complaints from the blood pressure.  11/03/22        Review of Systems  Constitutional:  Negative for chills, diaphoresis, fever, malaise/fatigue and weight loss.  HENT:  Negative for congestion, hearing loss, nosebleeds, sore throat and tinnitus.   Eyes:  Negative for blurred vision, photophobia and redness.  Respiratory:  Negative for cough, hemoptysis, sputum production, shortness of breath, wheezing and stridor.   Cardiovascular:  Negative for chest pain, palpitations, orthopnea, claudication, leg swelling and PND.  Gastrointestinal:  Negative for abdominal pain, blood in stool, constipation, diarrhea, heartburn, nausea and vomiting.  Genitourinary:  Negative for dysuria, flank pain, frequency, hematuria and urgency.  Musculoskeletal:  Negative for back pain, falls, joint pain, myalgias and neck pain.  Skin:  Negative for itching and rash.  Neurological:  Negative for dizziness, tingling, tremors, sensory change, speech change, focal weakness, seizures, loss of consciousness, weakness and headaches.  Endo/Heme/Allergies:  Negative for environmental allergies and polydipsia. Does not  bruise/bleed easily.  Psychiatric/Behavioral:  Negative for depression, memory loss, substance abuse and suicidal ideas. The patient is not nervous/anxious and does not have insomnia.       Objective:     There were no vitals taken for this visit.   Physical Exam Vitals reviewed.  Constitutional:      Appearance: Normal appearance. He is well-developed. He is not diaphoretic.  HENT:     Head: Normocephalic and atraumatic.     Nose: No nasal deformity, septal deviation, mucosal edema or rhinorrhea.     Right Sinus: No maxillary sinus tenderness or frontal sinus tenderness.     Left Sinus: No maxillary sinus tenderness or frontal sinus tenderness.     Mouth/Throat:     Pharynx: No oropharyngeal exudate.     Comments: Poor dentition Eyes:     General: No scleral icterus.    Conjunctiva/sclera: Conjunctivae normal.     Pupils: Pupils are equal, round, and reactive to light.  Neck:     Thyroid: No thyromegaly.     Vascular: No carotid bruit or JVD.     Trachea: Trachea normal. No tracheal tenderness or tracheal deviation.  Cardiovascular:     Rate and Rhythm: Normal rate and regular rhythm.     Chest Wall: PMI is not displaced.     Pulses: Normal pulses. No decreased pulses.     Heart sounds: Normal heart sounds, S1 normal and S2 normal. Heart sounds not distant. No murmur heard.    No systolic murmur is present.     No diastolic murmur is present.     No friction rub. No gallop. No S3 or S4  sounds.  Pulmonary:     Effort: No tachypnea, accessory muscle usage or respiratory distress.     Breath sounds: No stridor. No decreased breath sounds, wheezing, rhonchi or rales.     Comments: Mild gynecomastia no nodules Chest:     Chest wall: No tenderness.  Abdominal:     General: Bowel sounds are normal. There is no distension.     Palpations: Abdomen is soft. Abdomen is not rigid.     Tenderness: There is no abdominal tenderness. There is no guarding or rebound.   Musculoskeletal:        General: Normal range of motion.     Cervical back: Normal range of motion and neck supple. No edema, erythema or rigidity. No muscular tenderness. Normal range of motion.     Comments: Dry feet and severe onychomycosis  Lymphadenopathy:     Head:     Right side of head: No submental or submandibular adenopathy.     Left side of head: No submental or submandibular adenopathy.     Cervical: No cervical adenopathy.  Skin:    General: Skin is warm and dry.     Coloration: Skin is not pale.     Findings: No rash.     Nails: There is no clubbing.  Neurological:     Mental Status: He is alert and oriented to person, place, and time.     Sensory: No sensory deficit.  Psychiatric:        Speech: Speech normal.        Behavior: Behavior normal.      No results found for any visits on 11/03/22.    The ASCVD Risk score (Arnett DK, et al., 2019) failed to calculate for the following reasons:   The systolic blood pressure is missing   Cannot find a previous HDL lab    Assessment & Plan:   Problem List Items Addressed This Visit   None   35 minutes spent extra time needed to assess multiple problems and never seen the patient before had to assess everything No follow-ups on file.    Shan Levans, MD

## 2022-11-28 ENCOUNTER — Other Ambulatory Visit: Payer: Self-pay | Admitting: Critical Care Medicine

## 2022-11-28 DIAGNOSIS — E1169 Type 2 diabetes mellitus with other specified complication: Secondary | ICD-10-CM

## 2022-11-29 NOTE — Telephone Encounter (Signed)
Requested medications are due for refill today.  yes  Requested medications are on the active medications list.  yes  Last refill. 06/02/2022 #90 1 rf  Future visit scheduled.   yes  Notes to clinic.  Labs are expired.    Requested Prescriptions  Pending Prescriptions Disp Refills   atorvastatin (LIPITOR) 20 MG tablet [Pharmacy Med Name: ATORVASTATIN 20MG  TABLETS] 90 tablet 1    Sig: TAKE 1 TABLET(20 MG) BY MOUTH DAILY     Cardiovascular:  Antilipid - Statins Failed - 11/28/2022  6:35 AM      Failed - Lipid Panel in normal range within the last 12 months    Cholesterol, Total  Date Value Ref Range Status  07/23/2016 234 (H) 100 - 199 mg/dL Final   LDL Calculated  Date Value Ref Range Status  07/23/2016 176 (H) 0 - 99 mg/dL Final   HDL  Date Value Ref Range Status  07/23/2016 39 (L) >39 mg/dL Final   Triglycerides  Date Value Ref Range Status  07/23/2016 97 0 - 149 mg/dL Final         Passed - Patient is not pregnant      Passed - Valid encounter within last 12 months    Recent Outpatient Visits           6 months ago Type 2 diabetes mellitus with other specified complication, without long-term current use of insulin (HCC)   [place] [facility] Storm Frisk, MD   9 months ago Subareolar mass of right breast   [place] [facility] [place], [place], [place]   1 year ago Type 2 diabetes mellitus with other specified complication, without long-term current use of insulin (HCC)   [place] [facility] Hoy Register, MD   1 year ago Prediabetes   [place] [facility] Hoy Register, MD   2 years ago Testicular mass   [facility] [place], [place]ella [place], [place]       Future Appointments             In 1 week Storm Frisk, MD [place] [facility]            Signed Prescriptions Disp Refills    omeprazole (PRILOSEC) 40 MG capsule 90 capsule 1    Sig: TAKE 1 CAPSULE(40 MG) BY MOUTH DAILY     Gastroenterology: Proton Pump Inhibitors Passed - 11/28/2022  6:35 AM      Passed - Valid encounter within last 12 months    Recent Outpatient Visits           6 months ago Type 2 diabetes mellitus with other specified complication, without long-term current use of insulin [facility])   [place] [facility] Storm Frisk, MD   9 months ago Subareolar mass of right breast   [place] [facility] [place], [place], [place]   1 year ago Type 2 diabetes mellitus with other specified complication, without long-term current use of insulin [facility])   [place] [facility] [place], Odette Horns, [place]   1 year ago Prediabetes   [place] [facility] Hoy Register, MD   2 years ago Testicular mass   [facility] [place], [place], [place]       Future Appointments  In 1 week Storm Frisk, MD [facility]

## 2022-11-29 NOTE — Telephone Encounter (Signed)
Requested Prescriptions  Pending Prescriptions Disp Refills   atorvastatin (LIPITOR) 20 MG tablet [Pharmacy Med Name: ATORVASTATIN 20MG  TABLETS] 90 tablet 1    Sig: TAKE 1 TABLET(20 MG) BY MOUTH DAILY     Cardiovascular:  Antilipid - Statins Failed - 11/28/2022  6:35 AM      Failed - Lipid Panel in normal range within the last 12 months    Cholesterol, Total  Date Value Ref Range Status  07/23/2016 234 (H) 100 - 199 mg/dL Final   LDL Calculated  Date Value Ref Range Status  07/23/2016 176 (H) 0 - 99 mg/dL Final   HDL  Date Value Ref Range Status  07/23/2016 39 (L) >39 mg/dL Final   Triglycerides  Date Value Ref Range Status  07/23/2016 97 0 - 149 mg/dL Final         Passed - Patient is not pregnant      Passed - Valid encounter within last 12 months    Recent Outpatient Visits           6 months ago Type 2 diabetes mellitus with other specified complication, without long-term current use of insulin (HCC)   [place] [facility] Storm Frisk, MD   9 months ago Subareolar mass of right breast   [place] [facility] [place], [place], [place]   1 year ago Type 2 diabetes mellitus with other specified complication, without long-term current use of insulin (HCC)   [place] [facility] Hoy Register, MD   1 year ago Prediabetes   [place] [facility] Hoy Register, MD   2 years ago Testicular mass   [facility] [place], [place], [place]       Future Appointments             In 1 week Storm Frisk, MD [facility]             omeprazole (PRILOSEC) 40 MG capsule [Pharmacy Med Name: OMEPRAZOLE 40MG  CAPSULES] 60 capsule 2    Sig: TAKE 1 CAPSULE(40 MG) BY MOUTH DAILY     Gastroenterology: Proton Pump Inhibitors Passed - 11/28/2022  6:35 AM      Passed - Valid encounter within last 12 months     Recent Outpatient Visits           6 months ago Type 2 diabetes mellitus with other specified complication, without long-term current use of insulin [facility])   [place] Storm Frisk, MD   9 months ago Subareolar mass of right breast   [place] [facility] [place], [place], [place]   1 year ago Type 2 diabetes mellitus with other specified complication, without long-term current use of insulin [facility])   [place] [facility] [place], Odette Horns, [place]   1 year ago Prediabetes   [place] Hoy Register, MD   2 years ago Testicular mass   [facility] [place], [place]ella [place], [place]       Future Appointments             In 1 week [place] Charlcie Cradle, MD [facility]

## 2022-12-09 ENCOUNTER — Ambulatory Visit: Payer: 59 | Admitting: Critical Care Medicine

## 2022-12-09 NOTE — Progress Notes (Deleted)
Established Patient Office Visit  Subjective   Patient ID: [name], male    DOB: 13-Apr-1970  Age: 52 y.o. MRN: [id number]  No chief complaint on file.   52 y.o.M prev seen by Dr Newlin/Mcclung  06/02/22 This patient whom I have not seen before comes in today for primary care follow-up medication refill he has had issues of gynecomastia concern for breast cancer but mammogram was benign.  He has Patient also with hyperlipidemia with elevated cholesterol in the past.  A1c was at goal at the last visit.  had right shoulder pain and has received Percocet in the past he is off this medication.  Patient does have type 2 diabetes and maintains the metformin The patient has been on amlodipine 10 mg daily this was started in January.  On arrival blood pressure remains elevated 149/75  There are no complaints from the blood pressure.  12/09/22       Review of Systems  Constitutional:  Negative for chills, diaphoresis, fever, malaise/fatigue and weight loss.  HENT:  Negative for congestion, hearing loss, nosebleeds, sore throat and tinnitus.   Eyes:  Negative for blurred vision, photophobia and redness.  Respiratory:  Negative for cough, hemoptysis, sputum production, shortness of breath, wheezing and stridor.   Cardiovascular:  Negative for chest pain, palpitations, orthopnea, claudication, leg swelling and PND.  Gastrointestinal:  Negative for abdominal pain, blood in stool, constipation, diarrhea, heartburn, nausea and vomiting.  Genitourinary:  Negative for dysuria, flank pain, frequency, hematuria and urgency.  Musculoskeletal:  Negative for back pain, falls, joint pain, myalgias and neck pain.  Skin:  Negative for itching and rash.  Neurological:  Negative for dizziness, tingling, tremors, sensory change, speech change, focal weakness, seizures, loss of consciousness, weakness and headaches.  Endo/Heme/Allergies:  Negative for environmental allergies and polydipsia. Does not  bruise/bleed easily.  Psychiatric/Behavioral:  Negative for depression, memory loss, substance abuse and suicidal ideas. The patient is not nervous/anxious and does not have insomnia.       Objective:     There were no vitals taken for this visit.   Physical Exam Vitals reviewed.  Constitutional:      Appearance: Normal appearance. He is well-developed. He is not diaphoretic.  HENT:     Head: Normocephalic and atraumatic.     Nose: No nasal deformity, septal deviation, mucosal edema or rhinorrhea.     Right Sinus: No maxillary sinus tenderness or frontal sinus tenderness.     Left Sinus: No maxillary sinus tenderness or frontal sinus tenderness.     Mouth/Throat:     Pharynx: No oropharyngeal exudate.     Comments: Poor dentition Eyes:     General: No scleral icterus.    Conjunctiva/sclera: Conjunctivae normal.     Pupils: Pupils are equal, round, and reactive to light.  Neck:     Thyroid: No thyromegaly.     Vascular: No carotid bruit or JVD.     Trachea: Trachea normal. No tracheal tenderness or tracheal deviation.  Cardiovascular:     Rate and Rhythm: Normal rate and regular rhythm.     Chest Wall: PMI is not displaced.     Pulses: Normal pulses. No decreased pulses.     Heart sounds: Normal heart sounds, S1 normal and S2 normal. Heart sounds not distant. No murmur heard.    No systolic murmur is present.     No diastolic murmur is present.     No friction rub. No gallop. No S3 or S4 sounds.  Pulmonary:     Effort: No tachypnea, accessory muscle usage or respiratory distress.     Breath sounds: No stridor. No decreased breath sounds, wheezing, rhonchi or rales.     Comments: Mild gynecomastia no nodules Chest:     Chest wall: No tenderness.  Abdominal:     General: Bowel sounds are normal. There is no distension.     Palpations: Abdomen is soft. Abdomen is not rigid.     Tenderness: There is no abdominal tenderness. There is no guarding or rebound.   Musculoskeletal:        General: Normal range of motion.     Cervical back: Normal range of motion and neck supple. No edema, erythema or rigidity. No muscular tenderness. Normal range of motion.     Comments: Dry feet and severe onychomycosis  Lymphadenopathy:     Head:     Right side of head: No submental or submandibular adenopathy.     Left side of head: No submental or submandibular adenopathy.     Cervical: No cervical adenopathy.  Skin:    General: Skin is warm and dry.     Coloration: Skin is not pale.     Findings: No rash.     Nails: There is no clubbing.  Neurological:     Mental Status: He is alert and oriented to person, place, and time.     Sensory: No sensory deficit.  Psychiatric:        Speech: Speech normal.        Behavior: Behavior normal.      No results found for any visits on 12/09/22.    The ASCVD Risk score (Arnett DK, et al., 2019) failed to calculate for the following reasons:   The systolic blood pressure is missing   Cannot find a previous HDL lab    Assessment & Plan:   Problem List Items Addressed This Visit   None   35 minutes spent extra time needed to assess multiple problems and never seen the patient before had to assess everything No follow-ups on file.    Shan Levans, MD

## 2022-12-10 ENCOUNTER — Ambulatory Visit: Payer: Self-pay | Admitting: Nurse Practitioner

## 2022-12-15 ENCOUNTER — Encounter: Payer: Self-pay | Admitting: Nurse Practitioner

## 2022-12-15 ENCOUNTER — Telehealth: Payer: Self-pay | Admitting: Nurse Practitioner

## 2022-12-15 ENCOUNTER — Ambulatory Visit (INDEPENDENT_AMBULATORY_CARE_PROVIDER_SITE_OTHER): Payer: 59 | Admitting: Nurse Practitioner

## 2022-12-15 VITALS — BP 125/62 | HR 63 | Temp 97.1°F | Wt 174.0 lb

## 2022-12-15 DIAGNOSIS — E1169 Type 2 diabetes mellitus with other specified complication: Secondary | ICD-10-CM

## 2022-12-15 DIAGNOSIS — Z13220 Encounter for screening for lipoid disorders: Secondary | ICD-10-CM | POA: Diagnosis not present

## 2022-12-15 LAB — POCT GLYCOSYLATED HEMOGLOBIN (HGB A1C): Hemoglobin A1C: 6.3 % — AB (ref 4.0–5.6)

## 2022-12-15 NOTE — Telephone Encounter (Signed)
Caller & Relationship to patient:  MRN #  [id number]   Call Back Number:   Date of Last Office Visit: 12/15/2022     Date of Next Office Visit: 03/18/2023    Medication(s) to be Refilled: Metformin  Preferred Pharmacy:   ** Please notify patient to allow 48-72 hours to process** **Let patient know to contact pharmacy at the end of the day to make sure medication is ready. ** **If patient has not been seen in a year or longer, book an appointment **Advise to use MyChart for refill requests OR to contact their pharmacy

## 2022-12-15 NOTE — Progress Notes (Signed)
Subjective   Patient ID: [name], male    DOB: 10-13-1970, 52 y.o.   MRN: [id number]  Chief Complaint  Patient presents with   Hypertension    Referring provider: Storm Frisk, MD  ROYSTON BEKELE is a 52 y.o. male with Past Medical History: No date: CTS (carpal tunnel syndrome) No date: Diabetes mellitus without complication (HCC) No date: Family history of heart disease No date: Headache No date: Hypertension No date: Hypokalemia 06/18/2014: Light smoker No date: Tick bite   HPI  Patient presents today to establish care.  This is a former patient of Dr. Delford Field.  Patient states that he is compliant with medications.  He is on valsartan atorvastatin amlodipine, Lipitor metformin.  We will update labs today.  A1c in office today was 6.3. Denies f/c/s, n/v/d, hemoptysis, PND, leg swelling Denies chest pain or edema     Allergies  Allergen Reactions   Tramadol Hives    Immunization History  Administered Date(s) Administered   Janssen (J&J) SARS-COV-2 Vaccination 06/03/2019   Moderna SARS-COV2 Booster Vaccination 02/26/2020, 03/15/2020   Tdap 06/14/2016, 07/08/2019    Tobacco History: Social History   Tobacco Use  Smoking Status Former   Current packs/day: 2.00   Average packs/day: 2.0 packs/day for 20.0 years (40.0 ttl pk-yrs)   Types: Cigarettes  Smokeless Tobacco Never   Counseling given: Not Answered   Outpatient Encounter Medications as of 12/15/2022  Medication Sig   ACETAMINOPHEN PO Take by mouth as needed.   amLODipine (NORVASC) 10 MG tablet Take 1 tablet (10 mg total) by mouth daily.   atorvastatin (LIPITOR) 20 MG tablet TAKE 1 TABLET(20 MG) BY MOUTH DAILY   clotrimazole-betamethasone (LOTRISONE) cream Apply to both feet with skin moisturizer twice daily   ezetimibe (ZETIA) 10 MG tablet Take 1 tablet (10 mg total) by mouth daily.   fluticasone (FLONASE) 50 MCG/ACT nasal spray Place 2 sprays into both nostrils daily.   gabapentin  (NEURONTIN) 300 MG capsule Take 1 capsule (300 mg total) by mouth 3 (three) times daily. Take 1-2 pills at bedtime for nerve pain   metFORMIN (GLUCOPHAGE) 500 MG tablet TAKE 1 TABLET(500 MG) BY MOUTH TWICE DAILY WITH A MEAL   omeprazole (PRILOSEC) 40 MG capsule TAKE 1 CAPSULE(40 MG) BY MOUTH DAILY   tiZANidine (ZANAFLEX) 4 MG tablet Take 1 tablet (4 mg total) by mouth 3 (three) times daily as needed for muscle spasms.   valsartan (DIOVAN) 40 MG tablet Take 1 tablet (40 mg total) by mouth daily.   No facility-administered encounter medications on file as of 12/15/2022.    Review of Systems  Review of Systems  Constitutional: Negative.   HENT: Negative.    Cardiovascular: Negative.   Gastrointestinal: Negative.   Allergic/Immunologic: Negative.   Neurological: Negative.   Psychiatric/Behavioral: Negative.       Objective:   BP 125/62   Pulse 63   Temp (!) 97.1 F (36.2 C)   Wt 174 lb (78.9 kg)   SpO2 100%   BMI 28.96 kg/m   Wt Readings from Last 5 Encounters:  12/15/22 174 lb (78.9 kg)  07/01/22 181 lb (82.1 kg)  06/02/22 181 lb (82.1 kg)  03/03/22 178 lb (80.7 kg)  02/11/22 178 lb (80.7 kg)     Physical Exam Vitals and nursing note reviewed.  Constitutional:      General: He is not in acute distress.    Appearance: He is well-developed.  Cardiovascular:     Rate and Rhythm:  Normal rate and regular rhythm.  Pulmonary:     Effort: Pulmonary effort is normal.     Breath sounds: Normal breath sounds.  Skin:    General: Skin is warm and dry.  Neurological:     Mental Status: He is alert and oriented to person, place, and time.       Assessment & Plan:   Type 2 diabetes mellitus with other specified complication, without long-term current use of insulin (HCC) -     POCT glycosylated hemoglobin (Hb A1C) -     CBC -     Comprehensive metabolic panel  Lipid screening -     Lipid panel     Return in about 3 months (around 03/17/2023).   Ivonne Andrew,  NP 12/15/2022

## 2022-12-15 NOTE — Patient Instructions (Addendum)
1. Type 2 diabetes mellitus with other specified complication, without long-term current use of insulin (HCC)  - POCT glycosylated hemoglobin (Hb A1C) - CBC - Comprehensive metabolic panel   2. Lipid screening  - Lipid Panel   Follow up:  Follow up in 3 months

## 2022-12-16 ENCOUNTER — Other Ambulatory Visit: Payer: Self-pay | Admitting: Nurse Practitioner

## 2022-12-16 DIAGNOSIS — E1169 Type 2 diabetes mellitus with other specified complication: Secondary | ICD-10-CM

## 2022-12-16 LAB — CBC
Hematocrit: 46.7 % (ref 37.5–51.0)
Hemoglobin: 14.9 g/dL (ref 13.0–17.7)
MCH: 29.4 pg (ref 26.6–33.0)
MCHC: 31.9 g/dL (ref 31.5–35.7)
MCV: 92 fL (ref 79–97)
Platelets: 227 10*3/uL (ref 150–450)
RBC: 5.07 x10E6/uL (ref 4.14–5.80)
RDW: 13.7 % (ref 11.6–15.4)
WBC: 5.2 10*3/uL (ref 3.4–10.8)

## 2022-12-16 LAB — COMPREHENSIVE METABOLIC PANEL
ALT: 32 [IU]/L (ref 0–44)
AST: 24 [IU]/L (ref 0–40)
Albumin: 4.8 g/dL (ref 3.8–4.9)
Alkaline Phosphatase: 97 [IU]/L (ref 44–121)
BUN/Creatinine Ratio: 11 (ref 9–20)
BUN: 13 mg/dL (ref 6–24)
Bilirubin Total: 0.6 mg/dL (ref 0.0–1.2)
CO2: 23 mmol/L (ref 20–29)
Calcium: 10.1 mg/dL (ref 8.7–10.2)
Chloride: 104 mmol/L (ref 96–106)
Creatinine, Ser: 1.14 mg/dL (ref 0.76–1.27)
Globulin, Total: 2.6 g/dL (ref 1.5–4.5)
Glucose: 100 mg/dL — ABNORMAL HIGH (ref 70–99)
Potassium: 4.5 mmol/L (ref 3.5–5.2)
Sodium: 142 mmol/L (ref 134–144)
Total Protein: 7.4 g/dL (ref 6.0–8.5)
eGFR: 77 mL/min/{1.73_m2} (ref >59)

## 2022-12-16 LAB — LIPID PANEL
Chol/HDL Ratio: 4.8 {ratio} (ref 0.0–5.0)
Cholesterol, Total: 164 mg/dL (ref 100–199)
HDL: 34 mg/dL — ABNORMAL LOW (ref >39)
LDL Chol Calc (NIH): 107 mg/dL — ABNORMAL HIGH (ref 0–99)
Triglycerides: 124 mg/dL (ref 0–149)
VLDL Cholesterol Cal: 23 mg/dL (ref 5–40)

## 2022-12-16 MED ORDER — METFORMIN HCL 500 MG PO TABS: 500.0000 mg | ORAL_TABLET | Freq: Two times a day (BID) | ORAL | 0 refills | Status: DC

## 2023-03-04 ENCOUNTER — Other Ambulatory Visit: Payer: Self-pay | Admitting: Critical Care Medicine

## 2023-03-04 DIAGNOSIS — E1169 Type 2 diabetes mellitus with other specified complication: Secondary | ICD-10-CM

## 2023-03-04 NOTE — Telephone Encounter (Signed)
Patient no longer seeing provider Requested Prescriptions  Pending Prescriptions Disp Refills   atorvastatin (LIPITOR) 20 MG tablet [Pharmacy Med Name: ATORVASTATIN 20MG  TABLETS] 90 tablet 0    Sig: TAKE 1 TABLET(20 MG) BY MOUTH DAILY     Cardiovascular:  Antilipid - Statins Failed - 03/04/2023  1:23 PM      Failed - Lipid Panel in normal range within the last 12 months    Cholesterol, Total  Date Value Ref Range Status  12/15/2022 164 100 - 199 mg/dL Final   LDL Chol Calc (NIH)  Date Value Ref Range Status  12/15/2022 107 (H) 0 - 99 mg/dL Final   HDL  Date Value Ref Range Status  12/15/2022 34 (L) >39 mg/dL Final   Triglycerides  Date Value Ref Range Status  12/15/2022 124 0 - 149 mg/dL Final         Passed - Patient is not pregnant      Passed - Valid encounter within last 12 months    Recent Outpatient Visits           9 months ago Type 2 diabetes mellitus with other specified complication, without long-term current use of insulin (HCC)   [facility] Dept Of [place]. [facility] Storm Frisk, MD   1 year ago Subareolar mass of right breast   [facility] - A Dept Of [place]. [facility], [place], [place]   1 year ago Type 2 diabetes mellitus with other specified complication, without long-term current use of insulin (HCC)   [facility] - [facility] Dept Of [place]. [facility] Hoy Register, MD   1 year ago Prediabetes   [facility] - A Dept Of [place]. [facility] Hoy Register, MD   2 years ago Testicular mass    [facility] - [facility] Dept Of [place]. [facility] [place], [place]zella [place], [place]       Future Appointments             In 2 weeks Ivonne Andrew, NP [facility] - A Dept Of [facility]

## 2023-03-18 ENCOUNTER — Ambulatory Visit: Payer: Self-pay | Admitting: Nurse Practitioner

## 2023-04-06 ENCOUNTER — Ambulatory Visit: Payer: Self-pay | Admitting: Nurse Practitioner

## 2023-04-28 ENCOUNTER — Ambulatory Visit (INDEPENDENT_AMBULATORY_CARE_PROVIDER_SITE_OTHER): Payer: 59 | Admitting: Nurse Practitioner

## 2023-04-28 ENCOUNTER — Encounter: Payer: Self-pay | Admitting: Nurse Practitioner

## 2023-04-28 VITALS — BP 147/83 | HR 86 | Temp 97.9°F | Wt 179.2 lb

## 2023-04-28 DIAGNOSIS — E1169 Type 2 diabetes mellitus with other specified complication: Secondary | ICD-10-CM | POA: Diagnosis not present

## 2023-04-28 LAB — POCT GLYCOSYLATED HEMOGLOBIN (HGB A1C): Hemoglobin A1C: 6.4 % — AB (ref 4.0–5.6)

## 2023-04-28 NOTE — Patient Instructions (Signed)
1. Type 2 diabetes mellitus with other specified complication, without long-term current use of insulin (HCC) (Primary)  - POCT glycosylated hemoglobin (Hb A1C)   Follow up:  Follow up in 3 months

## 2023-04-28 NOTE — Progress Notes (Signed)
Subjective   Patient ID: [name], male    DOB: [birth year], 53 y.o.   MRN: [id number]  Chief Complaint  Patient presents with   Shoulder Pain    Left shoulder pain    Referring provider: Ivonne Andrew, NP  [name] is a 53 y.o. male with Past Medical History: No date: CTS (carpal tunnel syndrome) No date: Diabetes mellitus without complication (HCC) No date: Family history of heart disease No date: Headache No date: Hypertension No date: Hypokalemia 06/18/2014: Light smoker No date: Tick bite   HPI   Patient presents today to establish care.  This is a former patient of Dr. Delford Field.  Patient states that he is compliant with medications.  He is on valsartan atorvastatin amlodipine, Lipitor metformin.  We will update labs today.  A1c in office today was 6.4. Denies f/c/s, n/v/d, hemoptysis, PND, leg swelling Denies chest pain or edema.   Patient complains of left shoulder pain today.  He states that he does already have an appointment with orthopedics for next week.   Allergies  Allergen Reactions   Tramadol Hives    Immunization History  Administered Date(s) Administered   Janssen (J&J) SARS-COV-2 Vaccination 06/03/2019   Moderna SARS-COV2 Booster Vaccination 02/26/2020, 03/15/2020   Tdap 06/14/2016, 07/08/2019    Tobacco History: Social History   Tobacco Use  Smoking Status Former   Current packs/day: 2.00   Average packs/day: 2.0 packs/day for 20.0 years (40.0 ttl pk-yrs)   Types: Cigarettes  Smokeless Tobacco Never   Counseling given: Not Answered   Outpatient Encounter Medications as of 04/28/2023  Medication Sig   ACETAMINOPHEN PO Take by mouth as needed.   amLODipine (NORVASC) 10 MG tablet Take 1 tablet (10 mg total) by mouth daily.   atorvastatin (LIPITOR) 20 MG tablet TAKE 1 TABLET(20 MG) BY MOUTH DAILY   clotrimazole-betamethasone (LOTRISONE) cream Apply to both feet with skin moisturizer twice daily   ezetimibe (ZETIA) 10  MG tablet Take 1 tablet (10 mg total) by mouth daily.   fluticasone (FLONASE) 50 MCG/ACT nasal spray Place 2 sprays into both nostrils daily.   gabapentin (NEURONTIN) 300 MG capsule Take 1 capsule (300 mg total) by mouth 3 (three) times daily. Take 1-2 pills at bedtime for nerve pain   metFORMIN (GLUCOPHAGE) 500 MG tablet Take 1 tablet (500 mg total) by mouth 2 (two) times daily with a meal.   omeprazole (PRILOSEC) 40 MG capsule TAKE 1 CAPSULE(40 MG) BY MOUTH DAILY   tiZANidine (ZANAFLEX) 4 MG tablet Take 1 tablet (4 mg total) by mouth 3 (three) times daily as needed for muscle spasms.   valsartan (DIOVAN) 40 MG tablet Take 1 tablet (40 mg total) by mouth daily.   No facility-administered encounter medications on file as of 04/28/2023.    Review of Systems  Review of Systems  Constitutional: Negative.   HENT: Negative.    Cardiovascular: Negative.   Gastrointestinal: Negative.   Allergic/Immunologic: Negative.   Neurological: Negative.   Psychiatric/Behavioral: Negative.       Objective:   BP (!) 147/83   Pulse 86   Temp 97.9 F (36.6 C)   Wt 179 lb 3.2 oz (81.3 kg)   SpO2 100%   BMI 29.82 kg/m   Wt Readings from Last 5 Encounters:  04/28/23 179 lb 3.2 oz (81.3 kg)  12/15/22 174 lb (78.9 kg)  07/01/22 181 lb (82.1 kg)  06/02/22 181 lb (82.1 kg)  03/03/22 178 lb (80.7 kg)  Physical Exam Vitals and nursing note reviewed.  Constitutional:      General: He is not in acute distress.    Appearance: He is well-developed.  Cardiovascular:     Rate and Rhythm: Normal rate and regular rhythm.  Pulmonary:     Effort: Pulmonary effort is normal.     Breath sounds: Normal breath sounds.  Skin:    General: Skin is warm and dry.  Neurological:     Mental Status: He is alert and oriented to person, place, and time.       Assessment & Plan:   Type 2 diabetes mellitus with other specified complication, without long-term current use of insulin (HCC) -     POCT  glycosylated hemoglobin (Hb A1C)     Return in about 3 months (around 07/26/2023).   Ivonne Andrew, NP 04/28/2023

## 2023-05-27 ENCOUNTER — Other Ambulatory Visit: Payer: Self-pay | Admitting: Critical Care Medicine

## 2023-06-01 ENCOUNTER — Telehealth: Payer: Self-pay

## 2023-06-01 NOTE — Telephone Encounter (Signed)
 Copied from CRM [id number]. Topic: Clinical - Prescription Issue >> Jun 01, 2023  2:42 PM [name] wrote: Reason for CRM: Spoke with [name] over the phone regarding patient's Losartan medication. They would like for this medication to be sent over to the [facility] in patient's chart. This medication has been filled at another pharmacy, however, they refuse to pick it up because it is only a 30 day supply when patient is supposed to have a 90 day supply. Pharmacist stated that patient has already gone through the medication refills.

## 2023-06-05 ENCOUNTER — Other Ambulatory Visit: Payer: Self-pay | Admitting: Critical Care Medicine

## 2023-07-29 ENCOUNTER — Ambulatory Visit: Payer: Self-pay | Admitting: Nurse Practitioner

## 2023-08-10 ENCOUNTER — Telehealth: Payer: Self-pay | Admitting: Podiatry

## 2023-08-10 ENCOUNTER — Ambulatory Visit: Payer: 59 | Admitting: Podiatry

## 2023-08-10 ENCOUNTER — Other Ambulatory Visit: Payer: Self-pay | Admitting: Podiatry

## 2023-08-10 MED ORDER — CLOTRIMAZOLE-BETAMETHASONE 1-0.05 % EX CREA: TOPICAL_CREAM | CUTANEOUS | 5 refills | Status: DC

## 2023-08-10 NOTE — Telephone Encounter (Signed)
 Pt called in to update his info to [name]. He couldn't get his script because "Robertson" was missing. Please resend script for clotrimazole . Preferred pharmacy [phone].

## 2023-08-17 ENCOUNTER — Ambulatory Visit (INDEPENDENT_AMBULATORY_CARE_PROVIDER_SITE_OTHER): Payer: Self-pay | Admitting: Nurse Practitioner

## 2023-08-17 ENCOUNTER — Encounter: Payer: Self-pay | Admitting: Nurse Practitioner

## 2023-08-17 DIAGNOSIS — E1169 Type 2 diabetes mellitus with other specified complication: Secondary | ICD-10-CM | POA: Diagnosis not present

## 2023-08-17 MED ORDER — TRIAMCINOLONE ACETONIDE 0.5 % EX OINT
1.0000 | TOPICAL_OINTMENT | Freq: Two times a day (BID) | CUTANEOUS | 0 refills | Status: AC
Start: 2023-08-17 — End: ?

## 2023-08-17 MED ORDER — CLOTRIMAZOLE-BETAMETHASONE 1-0.05 % EX CREA: TOPICAL_CREAM | CUTANEOUS | 5 refills | Status: DC

## 2023-08-17 NOTE — Progress Notes (Signed)
 Subjective   Patient ID: David CHACHERE Sr., male    DOB: 08-11-1970, 53 y.o.   MRN: [id number]  Chief Complaint  Patient presents with   Medical Management of Chronic Issues    Referring provider: Jerrlyn Morel, NP  David Keen Sr. is a 53 y.o. male with Past Medical History: No date: CTS (carpal tunnel syndrome) No date: Diabetes mellitus without complication (HCC) No date: Family history of heart disease No date: Headache No date: Hypertension No date: Hypokalemia 06/18/2014: Light smoker No date: Tick bite   HPI  Patient presents today to establish care.  This is a former patient of Dr. Brent Cambric.  Patient states that he is compliant with medications.  He is on valsartan  atorvastatin  amlodipine , Lipitor metformin .  We will update labs today.  A1c at last visit was 6.4. Denies f/c/s, n/v/d, hemoptysis, PND, leg swelling Denies chest pain or edema.   Patient complains today of a rash to his back.  He states this started after a trip to the beach. Will trial kenalog  cream.    Allergies  Allergen Reactions   Tramadol  Hives    Immunization History  Administered Date(s) Administered   Janssen (J&J) SARS-COV-2 Vaccination 06/03/2019   Moderna SARS-COV2 Booster Vaccination 02/26/2020, 03/15/2020   Tdap 06/14/2016, 07/08/2019    Tobacco History: Social History   Tobacco Use  Smoking Status Former   Current packs/day: 2.00   Average packs/day: 2.0 packs/day for 20.0 years (40.0 ttl pk-yrs)   Types: Cigarettes  Smokeless Tobacco Never   Counseling given: Not Answered   Outpatient Encounter Medications as of 08/17/2023  Medication Sig   ACETAMINOPHEN  PO Take by mouth as needed.   amLODipine  (NORVASC ) 10 MG tablet Take 1 tablet (10 mg total) by mouth daily.   atorvastatin  (LIPITOR) 20 MG tablet TAKE 1 TABLET(20 MG) BY MOUTH DAILY   ezetimibe  (ZETIA ) 10 MG tablet TAKE 1 TABLET(10 MG) BY MOUTH DAILY   fluticasone  (FLONASE ) 50 MCG/ACT nasal spray Place 2  sprays into both nostrils daily.   gabapentin  (NEURONTIN ) 300 MG capsule Take 1 capsule (300 mg total) by mouth 3 (three) times daily. Take 1-2 pills at bedtime for nerve pain   metFORMIN  (GLUCOPHAGE ) 500 MG tablet Take 1 tablet (500 mg total) by mouth 2 (two) times daily with a meal.   omeprazole  (PRILOSEC) 40 MG capsule TAKE 1 CAPSULE(40 MG) BY MOUTH DAILY   tiZANidine  (ZANAFLEX ) 4 MG tablet Take 1 tablet (4 mg total) by mouth 3 (three) times daily as needed for muscle spasms.   triamcinolone  ointment (KENALOG ) 0.5 % Apply 1 Application topically 2 (two) times daily.   valsartan  (DIOVAN ) 40 MG tablet TAKE 1 TABLET(40 MG) BY MOUTH DAILY   [DISCONTINUED] clotrimazole -betamethasone  (LOTRISONE ) cream Apply to both feet with skin moisturizer twice daily   clotrimazole -betamethasone  (LOTRISONE ) cream Apply to both feet with skin moisturizer twice daily   No facility-administered encounter medications on file as of 08/17/2023.    Review of Systems  Review of Systems  Constitutional: Negative.   HENT: Negative.    Cardiovascular: Negative.   Gastrointestinal: Negative.   Allergic/Immunologic: Negative.   Neurological: Negative.   Psychiatric/Behavioral: Negative.       Objective:   BP (!) 158/81   Pulse 79   Temp 98.2 F (36.8 C) (Oral)   Wt 182 lb (82.6 kg)   SpO2 100%   BMI 30.29 kg/m   Wt Readings from Last 5 Encounters:  08/17/23 182 lb (82.6 kg)  04/28/23 179  lb 3.2 oz (81.3 kg)  12/15/22 174 lb (78.9 kg)  07/01/22 181 lb (82.1 kg)  06/02/22 181 lb (82.1 kg)     Physical Exam Vitals and nursing note reviewed.  Constitutional:      General: He is not in acute distress.    Appearance: He is well-developed.  Cardiovascular:     Rate and Rhythm: Normal rate and regular rhythm.  Pulmonary:     Effort: Pulmonary effort is normal.     Breath sounds: Normal breath sounds.  Skin:    General: Skin is warm and dry.  Neurological:     Mental Status: He is alert and oriented  to person, place, and time.       Assessment & Plan:   Type 2 diabetes mellitus with other specified complication, without long-term current use of insulin (HCC) -     Microalbumin / creatinine urine ratio -     CBC -     Comprehensive metabolic panel with GFR -     Hemoglobin A1c  Other orders -     Triamcinolone  Acetonide; Apply 1 Application topically 2 (two) times daily.  Dispense: 30 g; Refill: 0 -     Clotrimazole -Betamethasone ; Apply to both feet with skin moisturizer twice daily  Dispense: 45 g; Refill: 5     Return in about 3 months (around 11/17/2023).   Jerrlyn Morel, NP 08/17/2023

## 2023-08-17 NOTE — Patient Instructions (Signed)
 1. Type 2 diabetes mellitus with other specified complication, without long-term current use of insulin (HCC)  - Urine Albumin/Creatinine with ratio (send out) [LAB689] - CBC - Comprehensive metabolic panel with GFR - Hemoglobin A1c

## 2023-08-18 ENCOUNTER — Ambulatory Visit: Payer: Self-pay | Admitting: Nurse Practitioner

## 2023-08-18 LAB — MICROALBUMIN / CREATININE URINE RATIO
Creatinine, Urine: 256.3 mg/dL
Microalb/Creat Ratio: 2 mg/g{creat} (ref 0–29)
Microalbumin, Urine: 4.5 ug/mL

## 2023-08-19 LAB — COMPREHENSIVE METABOLIC PANEL WITH GFR
ALT: 29 IU/L (ref 0–44)
AST: 24 IU/L (ref 0–40)
Albumin: 4.7 g/dL (ref 3.8–4.9)
Alkaline Phosphatase: 95 IU/L (ref 44–121)
BUN/Creatinine Ratio: 11 (ref 9–20)
BUN: 13 mg/dL (ref 6–24)
Bilirubin Total: 0.4 mg/dL (ref 0.0–1.2)
CO2: 20 mmol/L (ref 20–29)
Calcium: 10.1 mg/dL (ref 8.7–10.2)
Chloride: 104 mmol/L (ref 96–106)
Creatinine, Ser: 1.16 mg/dL (ref 0.76–1.27)
Globulin, Total: 2.8 g/dL (ref 1.5–4.5)
Glucose: 126 mg/dL — ABNORMAL HIGH (ref 70–99)
Potassium: 4 mmol/L (ref 3.5–5.2)
Sodium: 140 mmol/L (ref 134–144)
Total Protein: 7.5 g/dL (ref 6.0–8.5)
eGFR: 75 mL/min/{1.73_m2} (ref >59)

## 2023-08-19 LAB — CBC
Hematocrit: 46.7 % (ref 37.5–51.0)
Hemoglobin: 14.5 g/dL (ref 13.0–17.7)
MCH: 28.5 pg (ref 26.6–33.0)
MCHC: 31 g/dL — ABNORMAL LOW (ref 31.5–35.7)
MCV: 92 fL (ref 79–97)
Platelets: 207 10*3/uL (ref 150–450)
RBC: 5.09 x10E6/uL (ref 4.14–5.80)
RDW: 14.1 % (ref 11.6–15.4)
WBC: 5.7 10*3/uL (ref 3.4–10.8)

## 2023-08-19 LAB — HEMOGLOBIN A1C
Est. average glucose Bld gHb Est-mCnc: 140 mg/dL
Hgb A1c MFr Bld: 6.5 % — ABNORMAL HIGH (ref 4.8–5.6)

## 2023-08-29 ENCOUNTER — Ambulatory Visit (INDEPENDENT_AMBULATORY_CARE_PROVIDER_SITE_OTHER): Admitting: Podiatry

## 2023-08-29 DIAGNOSIS — Z91199 Patient's noncompliance with other medical treatment and regimen due to unspecified reason: Secondary | ICD-10-CM

## 2023-08-29 NOTE — Progress Notes (Signed)
 No show

## 2023-09-21 ENCOUNTER — Ambulatory Visit

## 2023-09-21 ENCOUNTER — Encounter: Payer: Self-pay | Admitting: Nurse Practitioner

## 2023-09-21 VITALS — Ht 64.25 in | Wt 182.0 lb

## 2023-09-21 DIAGNOSIS — Z0000 Encounter for general adult medical examination without abnormal findings: Secondary | ICD-10-CM | POA: Diagnosis not present

## 2023-09-21 DIAGNOSIS — Z1211 Encounter for screening for malignant neoplasm of colon: Secondary | ICD-10-CM | POA: Diagnosis not present
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
## 2023-09-30 ENCOUNTER — Other Ambulatory Visit: Payer: Self-pay | Admitting: Critical Care Medicine

## 2023-09-30 DIAGNOSIS — M5410 Radiculopathy, site unspecified: Secondary | ICD-10-CM

## 2023-11-17 ENCOUNTER — Ambulatory Visit: Payer: Self-pay | Admitting: Nurse Practitioner

## 2023-12-15 ENCOUNTER — Encounter: Admitting: Nurse Practitioner

## 2023-12-29 ENCOUNTER — Encounter: Payer: Self-pay | Admitting: Nurse Practitioner

## 2023-12-29 ENCOUNTER — Telehealth (INDEPENDENT_AMBULATORY_CARE_PROVIDER_SITE_OTHER): Admitting: Nurse Practitioner

## 2023-12-29 ENCOUNTER — Other Ambulatory Visit: Payer: Self-pay | Admitting: Nurse Practitioner

## 2023-12-29 DIAGNOSIS — M5410 Radiculopathy, site unspecified: Secondary | ICD-10-CM | POA: Diagnosis not present

## 2023-12-29 DIAGNOSIS — E1169 Type 2 diabetes mellitus with other specified complication: Secondary | ICD-10-CM | POA: Diagnosis not present

## 2023-12-29 MED ORDER — CLOTRIMAZOLE-BETAMETHASONE 1-0.05 % EX CREA: TOPICAL_CREAM | CUTANEOUS | 5 refills | Status: AC

## 2023-12-29 MED ORDER — ATORVASTATIN CALCIUM 20 MG PO TABS: 20.0000 mg | ORAL_TABLET | Freq: Every day | ORAL | 0 refills | Status: AC

## 2023-12-29 MED ORDER — OMEPRAZOLE 40 MG PO CPDR: 40.0000 mg | DELAYED_RELEASE_CAPSULE | Freq: Every day | ORAL | 1 refills | Status: AC

## 2023-12-29 MED ORDER — VALSARTAN 40 MG PO TABS: 40.0000 mg | ORAL_TABLET | Freq: Every day | ORAL | 3 refills | Status: AC

## 2023-12-29 MED ORDER — GABAPENTIN 300 MG PO CAPS: 300.0000 mg | ORAL_CAPSULE | Freq: Two times a day (BID) | ORAL | 3 refills | Status: AC

## 2023-12-29 MED ORDER — METFORMIN HCL 500 MG PO TABS: 500.0000 mg | ORAL_TABLET | Freq: Two times a day (BID) | ORAL | 0 refills | Status: DC

## 2023-12-29 MED ORDER — DICLOFENAC SODIUM 1 % EX GEL: 2.0000 g | Freq: Four times a day (QID) | CUTANEOUS | 2 refills | Status: AC

## 2023-12-29 MED ORDER — AMLODIPINE BESYLATE 10 MG PO TABS: 10.0000 mg | ORAL_TABLET | Freq: Every day | ORAL | 3 refills | Status: DC

## 2023-12-29 NOTE — Progress Notes (Signed)
 Virtual Visit via Video Note  I connected with David K Chern Sr. on 12/29/23 at  2:40 PM EDT by a video enabled telemedicine application and verified that I am speaking with the correct person using two identifiers.  Location: Patient: home Provider: office   I discussed the limitations of evaluation and management by telemedicine and the availability of in person appointments. The patient expressed understanding and agreed to proceed.  History of Present Illness:  Patient presents today through video visit for follow-up visit.  Overall he has been doing well.  He does have some ongoing nerve pain to his legs.  We will refill gabapentin  and order Voltaren  gel.  Patient will return tomorrow for labs. Denies f/c/s, n/v/d, hemoptysis, PND, leg swelling Denies chest pain or edema     Observations/Objective:     12/29/2023    2:29 PM 09/21/2023    1:38 PM 08/17/2023    1:58 PM  Vitals with BMI  Height 5' 4.25 5' 4.25   Weight 182 lbs 182 lbs   BMI 31 31   Systolic   158  Diastolic   81  Pulse   79      Assessment and Plan:   1. Type 2 diabetes mellitus with other specified complication, without long-term current use of insulin (HCC)  - atorvastatin  (LIPITOR) 20 MG tablet; Take 1 tablet (20 mg total) by mouth daily.  Dispense: 90 tablet; Refill: 0 - CBC; Future - Comprehensive metabolic panel with GFR; Future - Lipid Panel; Future - Hemoglobin A1c; Future  2. Radiculopathy, unspecified spinal region  - gabapentin  (NEURONTIN ) 300 MG capsule; Take 1 capsule (300 mg total) by mouth 2 (two) times daily.  Dispense: 90 capsule; Refill: 3  Follow-up in 3 months   I discussed the assessment and treatment plan with the patient. The patient was provided an opportunity to ask questions and all were answered. The patient agreed with the plan and demonstrated an understanding of the instructions.   The patient was advised to call back or seek an in-person evaluation if the  symptoms worsen or if the condition fails to improve as anticipated.  I provided 22 minutes of non-face-to-face time during this encounter.   David GORMAN Borer, NP

## 2023-12-30 ENCOUNTER — Other Ambulatory Visit: Payer: Self-pay

## 2023-12-30 DIAGNOSIS — E1169 Type 2 diabetes mellitus with other specified complication: Secondary | ICD-10-CM

## 2023-12-31 LAB — COMPREHENSIVE METABOLIC PANEL WITH GFR
ALT: 25 IU/L (ref 0–44)
AST: 22 IU/L (ref 0–40)
Albumin: 4.7 g/dL (ref 3.8–4.9)
Alkaline Phosphatase: 89 IU/L (ref 47–123)
BUN/Creatinine Ratio: 12 (ref 9–20)
BUN: 14 mg/dL (ref 6–24)
Bilirubin Total: 0.6 mg/dL (ref 0.0–1.2)
CO2: 25 mmol/L (ref 20–29)
Calcium: 9.6 mg/dL (ref 8.7–10.2)
Chloride: 103 mmol/L (ref 96–106)
Creatinine, Ser: 1.21 mg/dL (ref 0.76–1.27)
Globulin, Total: 2.4 g/dL (ref 1.5–4.5)
Glucose: 101 mg/dL — ABNORMAL HIGH (ref 70–99)
Potassium: 4.3 mmol/L (ref 3.5–5.2)
Sodium: 141 mmol/L (ref 134–144)
Total Protein: 7.1 g/dL (ref 6.0–8.5)
eGFR: 71 mL/min/1.73 (ref >59)

## 2023-12-31 LAB — HEMOGLOBIN A1C
Est. average glucose Bld gHb Est-mCnc: 128 mg/dL
Hgb A1c MFr Bld: 6.1 % — ABNORMAL HIGH (ref 4.8–5.6)

## 2023-12-31 LAB — LIPID PANEL
Chol/HDL Ratio: 4.8 ratio (ref 0.0–5.0)
Cholesterol, Total: 171 mg/dL (ref 100–199)
HDL: 36 mg/dL — ABNORMAL LOW (ref >39)
LDL Chol Calc (NIH): 121 mg/dL — ABNORMAL HIGH (ref 0–99)
Triglycerides: 71 mg/dL (ref 0–149)
VLDL Cholesterol Cal: 14 mg/dL (ref 5–40)

## 2023-12-31 LAB — CBC
Hematocrit: 45.6 % (ref 37.5–51.0)
Hemoglobin: 14.5 g/dL (ref 13.0–17.7)
MCH: 29.6 pg (ref 26.6–33.0)
MCHC: 31.8 g/dL (ref 31.5–35.7)
MCV: 93 fL (ref 79–97)
Platelets: 200 x10E3/uL (ref 150–450)
RBC: 4.9 x10E6/uL (ref 4.14–5.80)
RDW: 13.9 % (ref 11.6–15.4)
WBC: 4.8 x10E3/uL (ref 3.4–10.8)

## 2024-01-02 ENCOUNTER — Ambulatory Visit: Payer: Self-pay | Admitting: Nurse Practitioner

## 2024-02-15 ENCOUNTER — Ambulatory Visit (INDEPENDENT_AMBULATORY_CARE_PROVIDER_SITE_OTHER)

## 2024-02-15 ENCOUNTER — Encounter (HOSPITAL_COMMUNITY): Payer: Self-pay

## 2024-02-15 ENCOUNTER — Inpatient Hospital Stay (HOSPITAL_COMMUNITY): Admission: RE | Admit: 2024-02-15 | Discharge: 2024-02-15 | Attending: Internal Medicine | Admitting: Internal Medicine

## 2024-02-15 VITALS — BP 149/78 | HR 83 | Temp 97.9°F | Resp 18

## 2024-02-15 DIAGNOSIS — S8001XA Contusion of right knee, initial encounter: Secondary | ICD-10-CM | POA: Diagnosis not present

## 2024-02-15 NOTE — ED Triage Notes (Signed)
 Pt states the sliding glass door at [employer] shut on his rt knee on Sunday. States took a oxycodone  at [place], [employer] and wearing a brace with some relief.
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
## 2024-02-15 NOTE — Discharge Instructions (Signed)
 I do not see any evidence of fracture or acute findings on your xrays. You have a bad bruise along the kneecap from your recent injury. I recommend icing and tylenol  for pain relief. OK to use a knee brace for comfort as needed. Return to care if pain worsens or the knee feels unstable while walking.

## 2024-02-29 ENCOUNTER — Encounter: Payer: Self-pay | Admitting: Nurse Practitioner

## 2024-02-29 ENCOUNTER — Ambulatory Visit: Admitting: Nurse Practitioner

## 2024-02-29 ENCOUNTER — Ambulatory Visit (HOSPITAL_COMMUNITY)
Admission: RE | Admit: 2024-02-29 | Discharge: 2024-02-29 | Disposition: A | Discharge status: Home / Self Care | Payer: Self-pay | Source: Ambulatory Visit | Attending: Nurse Practitioner | Admitting: Nurse Practitioner

## 2024-02-29 VITALS — BP 148/73 | HR 70 | Wt 183.2 lb

## 2024-02-29 DIAGNOSIS — Z23 Encounter for immunization: Secondary | ICD-10-CM | POA: Diagnosis not present

## 2024-02-29 DIAGNOSIS — I10 Essential (primary) hypertension: Secondary | ICD-10-CM

## 2024-02-29 DIAGNOSIS — M25561 Pain in right knee: Secondary | ICD-10-CM

## 2024-02-29 MED ORDER — PREDNISONE 20 MG PO TABS: 20.0000 mg | ORAL_TABLET | Freq: Every day | ORAL | 0 refills | Status: AC

## 2024-02-29 MED ORDER — KETOROLAC TROMETHAMINE 30 MG/ML IJ SOLN
30.0000 mg | Freq: Once | INTRAMUSCULAR | Status: AC
  Administered 2024-03-01: 15:00:00 30 mg via INTRAMUSCULAR

## 2024-02-29 MED ORDER — AMLODIPINE BESYLATE 10 MG PO TABS: 10.0000 mg | ORAL_TABLET | Freq: Every day | ORAL | 3 refills | Status: AC
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
## 2024-03-01 DIAGNOSIS — M25561 Pain in right knee: Secondary | ICD-10-CM | POA: Diagnosis not present

## 2024-03-01 NOTE — Addendum Note (Signed)
 Addended by: Kaeson Kleinert R on: 03/01/2024 03:02 PM   Modules accepted: Orders

## 2024-03-16 ENCOUNTER — Ambulatory Visit: Payer: Self-pay | Admitting: Nurse Practitioner

## 2024-03-21 ENCOUNTER — Ambulatory Visit: Payer: Self-pay | Admitting: Nurse Practitioner

## 2024-05-30 ENCOUNTER — Ambulatory Visit: Payer: Self-pay | Admitting: Nurse Practitioner

## 2024-09-20 ENCOUNTER — Ambulatory Visit: Payer: Self-pay
